# Patient Record
Sex: Female | Born: 1960 | Race: Black or African American | Hispanic: No | Marital: Married | Smoking: Former smoker
Health system: Southern US, Community
[De-identification: ages and names within clinical notes are randomized; demographics above are authoritative.]

## PROBLEM LIST (undated history)

## (undated) DIAGNOSIS — IMO0001 Reserved for inherently not codable concepts without codable children: Secondary | ICD-10-CM

## (undated) DIAGNOSIS — E119 Type 2 diabetes mellitus without complications: Secondary | ICD-10-CM

## (undated) DIAGNOSIS — F410 Panic disorder [episodic paroxysmal anxiety] without agoraphobia: Secondary | ICD-10-CM

## (undated) DIAGNOSIS — E669 Obesity, unspecified: Secondary | ICD-10-CM

## (undated) DIAGNOSIS — J449 Chronic obstructive pulmonary disease, unspecified: Secondary | ICD-10-CM

## (undated) DIAGNOSIS — F419 Anxiety disorder, unspecified: Secondary | ICD-10-CM

## (undated) DIAGNOSIS — M1990 Unspecified osteoarthritis, unspecified site: Secondary | ICD-10-CM

## (undated) DIAGNOSIS — I10 Essential (primary) hypertension: Secondary | ICD-10-CM

## (undated) DIAGNOSIS — Z86718 Personal history of other venous thrombosis and embolism: Secondary | ICD-10-CM

## (undated) DIAGNOSIS — K5790 Diverticulosis of intestine, part unspecified, without perforation or abscess without bleeding: Secondary | ICD-10-CM

## (undated) DIAGNOSIS — E7800 Pure hypercholesterolemia, unspecified: Secondary | ICD-10-CM

## (undated) DIAGNOSIS — G4730 Sleep apnea, unspecified: Secondary | ICD-10-CM

## (undated) DIAGNOSIS — R609 Edema, unspecified: Secondary | ICD-10-CM

## (undated) DIAGNOSIS — K219 Gastro-esophageal reflux disease without esophagitis: Secondary | ICD-10-CM

## (undated) HISTORY — PX: TUBAL LIGATION: SHX77

## (undated) HISTORY — PX: SHOULDER ARTHROSCOPY W/ ROTATOR CUFF REPAIR: SHX2400

## (undated) HISTORY — PX: COLONOSCOPY: SHX174

## (undated) HISTORY — PX: BILATERAL CARPAL TUNNEL RELEASE: SHX6508

## (undated) HISTORY — PX: KNEE ARTHROSCOPY: SHX127

---

## 2003-02-28 ENCOUNTER — Emergency Department (HOSPITAL_COMMUNITY): Admission: AC | Admit: 2003-02-28 | Discharge: 2003-02-28 | Payer: Self-pay

## 2004-01-04 ENCOUNTER — Emergency Department: Payer: Self-pay | Admitting: Emergency Medicine

## 2004-01-05 ENCOUNTER — Emergency Department: Payer: Self-pay | Admitting: Unknown Physician Specialty

## 2004-02-25 ENCOUNTER — Emergency Department: Payer: Self-pay | Admitting: Emergency Medicine

## 2005-07-31 ENCOUNTER — Emergency Department: Payer: Self-pay | Admitting: Emergency Medicine

## 2007-01-02 ENCOUNTER — Emergency Department: Payer: Self-pay | Admitting: Emergency Medicine

## 2008-07-03 ENCOUNTER — Emergency Department: Payer: Self-pay | Admitting: Emergency Medicine

## 2008-07-04 ENCOUNTER — Observation Stay: Payer: Self-pay | Admitting: Internal Medicine

## 2009-08-04 ENCOUNTER — Ambulatory Visit: Payer: Self-pay | Admitting: Internal Medicine

## 2009-08-19 ENCOUNTER — Ambulatory Visit: Payer: Self-pay | Admitting: Rheumatology

## 2009-11-03 ENCOUNTER — Ambulatory Visit: Payer: Self-pay | Admitting: Rheumatology

## 2010-02-21 ENCOUNTER — Inpatient Hospital Stay: Payer: Self-pay | Admitting: Internal Medicine

## 2010-08-17 ENCOUNTER — Ambulatory Visit: Payer: Self-pay | Admitting: Specialist

## 2010-09-06 ENCOUNTER — Ambulatory Visit: Payer: Self-pay | Admitting: Specialist

## 2010-09-20 ENCOUNTER — Ambulatory Visit: Payer: Self-pay | Admitting: Specialist

## 2011-01-16 DIAGNOSIS — I10 Essential (primary) hypertension: Secondary | ICD-10-CM | POA: Diagnosis present

## 2011-05-25 ENCOUNTER — Ambulatory Visit: Payer: Self-pay | Admitting: Internal Medicine

## 2011-08-24 ENCOUNTER — Ambulatory Visit: Payer: Self-pay | Admitting: Cardiovascular Disease

## 2011-11-17 ENCOUNTER — Ambulatory Visit: Payer: Self-pay | Admitting: Specialist

## 2011-12-28 ENCOUNTER — Ambulatory Visit: Payer: Self-pay | Admitting: Specialist

## 2011-12-28 LAB — HEMOGLOBIN: HGB: 14.9 g/dL (ref 12.0–16.0)

## 2011-12-28 LAB — POTASSIUM: Potassium: 4 mmol/L (ref 3.5–5.1)

## 2012-01-04 ENCOUNTER — Ambulatory Visit: Payer: Self-pay | Admitting: Specialist

## 2012-08-26 ENCOUNTER — Ambulatory Visit: Payer: Self-pay | Admitting: Internal Medicine

## 2012-09-03 ENCOUNTER — Emergency Department: Payer: Self-pay | Admitting: Emergency Medicine

## 2012-09-03 LAB — URINALYSIS, COMPLETE
Bilirubin,UR: NEGATIVE
Glucose,UR: NEGATIVE mg/dL (ref 0–75)
Ketone: NEGATIVE
Leukocyte Esterase: NEGATIVE
Nitrite: NEGATIVE
Ph: 5 (ref 4.5–8.0)
Protein: NEGATIVE
RBC,UR: 1 /HPF (ref 0–5)
Specific Gravity: 1.005 (ref 1.003–1.030)
Squamous Epithelial: 7
WBC UR: 1 /HPF (ref 0–5)

## 2012-09-28 ENCOUNTER — Emergency Department: Payer: Self-pay | Admitting: Emergency Medicine

## 2012-10-08 ENCOUNTER — Ambulatory Visit: Payer: Self-pay | Admitting: Internal Medicine

## 2012-10-11 ENCOUNTER — Emergency Department: Payer: Self-pay | Admitting: Internal Medicine

## 2012-11-16 ENCOUNTER — Emergency Department: Payer: Self-pay | Admitting: Internal Medicine

## 2012-11-19 ENCOUNTER — Ambulatory Visit: Payer: Self-pay | Admitting: Pain Medicine

## 2012-12-16 ENCOUNTER — Ambulatory Visit: Payer: Self-pay | Admitting: Pain Medicine

## 2013-01-28 ENCOUNTER — Ambulatory Visit: Payer: Self-pay | Admitting: Pain Medicine

## 2013-02-01 ENCOUNTER — Emergency Department: Payer: Self-pay | Admitting: Emergency Medicine

## 2013-02-20 ENCOUNTER — Ambulatory Visit: Payer: Self-pay | Admitting: Specialist

## 2013-02-25 ENCOUNTER — Ambulatory Visit: Payer: Self-pay | Admitting: Pain Medicine

## 2013-06-24 ENCOUNTER — Emergency Department: Payer: Self-pay | Admitting: Emergency Medicine

## 2013-08-03 ENCOUNTER — Emergency Department: Payer: Self-pay | Admitting: Emergency Medicine

## 2013-08-05 DIAGNOSIS — G4733 Obstructive sleep apnea (adult) (pediatric): Secondary | ICD-10-CM | POA: Diagnosis present

## 2013-09-30 ENCOUNTER — Ambulatory Visit: Payer: Self-pay | Admitting: Pain Medicine

## 2013-10-02 ENCOUNTER — Ambulatory Visit: Payer: Self-pay | Admitting: Gastroenterology

## 2013-10-03 LAB — PATHOLOGY REPORT

## 2013-11-16 ENCOUNTER — Inpatient Hospital Stay: Payer: Self-pay | Admitting: Internal Medicine

## 2013-11-16 LAB — CBC
HCT: 46.5 % (ref 35.0–47.0)
HGB: 15.2 g/dL (ref 12.0–16.0)
MCH: 31 pg (ref 26.0–34.0)
MCHC: 32.6 g/dL (ref 32.0–36.0)
MCV: 95 fL (ref 80–100)
Platelet: 272 10*3/uL (ref 150–440)
RBC: 4.9 10*6/uL (ref 3.80–5.20)
RDW: 14.1 % (ref 11.5–14.5)
WBC: 8.6 10*3/uL (ref 3.6–11.0)

## 2013-11-16 LAB — BASIC METABOLIC PANEL
Anion Gap: 4 — ABNORMAL LOW (ref 7–16)
BUN: 6 mg/dL — ABNORMAL LOW (ref 7–18)
Calcium, Total: 9 mg/dL (ref 8.5–10.1)
Chloride: 99 mmol/L (ref 98–107)
Co2: 36 mmol/L — ABNORMAL HIGH (ref 21–32)
Creatinine: 0.62 mg/dL (ref 0.60–1.30)
EGFR (African American): >60
EGFR (Non-African Amer.): >60
Glucose: 126 mg/dL — ABNORMAL HIGH (ref 65–99)
Osmolality: 277 (ref 275–301)
Potassium: 3.6 mmol/L (ref 3.5–5.1)
Sodium: 139 mmol/L (ref 136–145)

## 2013-11-16 LAB — TROPONIN I: Troponin-I: <0.02 ng/mL

## 2013-11-17 LAB — CBC WITH DIFFERENTIAL/PLATELET
Basophil #: 0 10*3/uL (ref 0.0–0.1)
Basophil %: 0.3 %
Eosinophil #: 0 10*3/uL (ref 0.0–0.7)
Eosinophil %: 0 %
HCT: 43.2 % (ref 35.0–47.0)
HGB: 14.4 g/dL (ref 12.0–16.0)
Lymphocyte #: 0.6 10*3/uL — ABNORMAL LOW (ref 1.0–3.6)
Lymphocyte %: 4.4 %
MCH: 31.4 pg (ref 26.0–34.0)
MCHC: 33.4 g/dL (ref 32.0–36.0)
MCV: 94 fL (ref 80–100)
Monocyte #: 0.4 x10 3/mm (ref 0.2–0.9)
Monocyte %: 3.1 %
Neutrophil #: 12.7 10*3/uL — ABNORMAL HIGH (ref 1.4–6.5)
Neutrophil %: 92.2 %
Platelet: 251 10*3/uL (ref 150–440)
RBC: 4.61 10*6/uL (ref 3.80–5.20)
RDW: 13.9 % (ref 11.5–14.5)
WBC: 13.8 10*3/uL — ABNORMAL HIGH (ref 3.6–11.0)

## 2013-11-17 LAB — BASIC METABOLIC PANEL
Anion Gap: 6 — ABNORMAL LOW (ref 7–16)
BUN: 11 mg/dL (ref 7–18)
Calcium, Total: 9.4 mg/dL (ref 8.5–10.1)
Chloride: 95 mmol/L — ABNORMAL LOW (ref 98–107)
Co2: 36 mmol/L — ABNORMAL HIGH (ref 21–32)
Creatinine: 0.67 mg/dL (ref 0.60–1.30)
EGFR (African American): >60
EGFR (Non-African Amer.): >60
Glucose: 220 mg/dL — ABNORMAL HIGH (ref 65–99)
Osmolality: 280 (ref 275–301)
Potassium: 4 mmol/L (ref 3.5–5.1)
Sodium: 137 mmol/L (ref 136–145)

## 2013-11-29 ENCOUNTER — Emergency Department: Payer: Self-pay | Admitting: Emergency Medicine

## 2013-12-10 ENCOUNTER — Ambulatory Visit: Payer: Self-pay | Admitting: Pain Medicine

## 2013-12-17 ENCOUNTER — Ambulatory Visit: Payer: Self-pay | Admitting: Internal Medicine

## 2014-06-11 ENCOUNTER — Emergency Department
Admit: 2014-06-11 | Disposition: A | Discharge status: Home / Self Care | Payer: Self-pay | Admitting: Emergency Medicine

## 2014-06-11 LAB — BASIC METABOLIC PANEL
Anion Gap: 11 (ref 7–16)
BUN: 7 mg/dL
Calcium, Total: 9.6 mg/dL
Chloride: 91 mmol/L — ABNORMAL LOW
Co2: 37 mmol/L — ABNORMAL HIGH
Creatinine: 0.55 mg/dL
EGFR (African American): >60
EGFR (Non-African Amer.): >60
Glucose: 132 mg/dL — ABNORMAL HIGH
Potassium: 2.9 mmol/L — ABNORMAL LOW
Sodium: 139 mmol/L

## 2014-06-11 LAB — TROPONIN I: Troponin-I: <0.03 ng/mL

## 2014-06-11 LAB — CBC
HCT: 43.5 % (ref 35.0–47.0)
HGB: 14.3 g/dL (ref 12.0–16.0)
MCH: 29.9 pg (ref 26.0–34.0)
MCHC: 32.9 g/dL (ref 32.0–36.0)
MCV: 91 fL (ref 80–100)
Platelet: 258 10*3/uL (ref 150–440)
RBC: 4.78 10*6/uL (ref 3.80–5.20)
RDW: 14 % (ref 11.5–14.5)
WBC: 11.3 10*3/uL — ABNORMAL HIGH (ref 3.6–11.0)

## 2014-06-30 NOTE — Op Note (Signed)
PATIENT NAME:  Patty Leon, Patty Leon MR#:  [id number] DATE OF BIRTH:  10-27-1960  DATE OF PROCEDURE:  01/04/2012  PREOPERATIVE DIAGNOSES:  1. Tear of medial meniscus, left knee.  2. Degenerative arthritis medial compartment, left knee.   POSTOPERATIVE DIAGNOSES: 1. Tear of medial meniscus, left knee.  2. Degenerative arthritis medial compartment, left knee.   PROCEDURE: Arthroscopic partial medial meniscectomy, left knee.   SURGEON: Lucas Mallow, MD   ANESTHESIA: General.   COMPLICATIONS: None.   PROCEDURE: After adequate induction of general anesthesia, the left lower extremity is secured in the legholder for arthroscopy in the usual manner. The left leg is thoroughly prepped with alcohol and ChloraPrep and draped in standard sterile fashion. The joint is infiltrated with 10 mL of Marcaine with epinephrine. Diagnostic arthroscopy is then performed through a lateral portal. The patellofemoral joint is within normal limits. The lateral compartment is normal. There is some increased synovium in the intercondylar notch but, otherwise, the anterior cruciate ligament is normal. The pathology is confined largely to the medial compartment. There is seen to be moderate chondromalacia of the weightbearing surface of the medial femoral condyle. There is an associated complex tear of the posterior horn of the medial meniscus. Using a combination of the duckbill forceps, the full radial resector, and the 50 degree ArthroWand, the torn portion of the meniscus is fully resected back to a stable rim. Careful search for any other pathology is made and none is seen. The joint is thoroughly irrigated multiple times. Skin edges are closed with 5-0 nylon. The joint is infiltrated with 1 mL of Depo-Medrol, 4 mg of morphine, and 10 mL of Marcaine with epinephrine. Soft bulky dressing is applied.     The patient is returned to the recovery room in satisfactory condition having tolerated the procedure quite well.    ____________________________ Lucas Mallow, MD ces:drc D: 01/04/2012 12:59:33 ET T: 01/04/2012 13:40:04 ET JOB#: [id number]  cc: Lucas Mallow, MD, <Dictator> Lucas Mallow MD ELECTRONICALLY SIGNED 01/06/2012 8:57

## 2014-07-04 NOTE — H&P (Signed)
PATIENT NAME:  Patty Leon, Patty Leon MR#:  [id number] DATE OF BIRTH:  [birth year]  DATE OF ADMISSION:  11/16/2013  PRIMARY CARE PHYSICIAN:  Perrin Maltese, MD.   REFERRING EMERGENCY ROOM PHYSICIAN:  Dr. Wynetta Emery.   CHIEF COMPLAINT: Shortness of breath.   HISTORY OF PRESENT ILLNESS: This is a very pleasant 54 year old female with history of COPD, obstructive sleep apnea on CPAP, hypertension, hyperlipidemia, who presents today with 2-3 days of progressive shortness of breath. She reports that about 5 days ago she developed sinus symptoms with runny nose and congestion. Two days ago she started to develop a cough productive of clear sputum which has now progressed to being productive of beige sputum. No hemoptysis. On the evening of admission she reports being very short of breath at rest and with conversation. No fevers, no chills, no nausea, vomiting, diarrhea. No chest pain. She has been taking Mucinex, over-the-counter cold medication. She has also been taking hydrocodone for pain in her knee. She also reports that she has been unable to use her CPAP machine due to congestion. On presentation to the Emergency Room, her oxygen saturation was 78% on room air. In conversation on 2 liters of nasal cannula she desaturated into the mid 80s. She is admitted for treatment of COPD exacerbation.   PAST MEDICAL HISTORY:  1.  COPD. 2.  Obstructive sleep apnea on CPAP. She reports that she has oxygen through her CPAP.  3.  Obesity.  4.  Hypertension.  5.  Hyperlipidemia.  6.  Colon polyps.  7.  Ongoing tobacco abuse.  PAST SURGICAL HISTORY:  1.  Bilateral carpal tunnel release.  2.  Tubal ligation.  3.  Right shoulder surgery.  4.  Left knee surgery.   HOME MEDICATIONS:  1.  Vitamin D 2000 units once a day.  2.  Spiriva 18 mcg inhalation 1 capsule inhaled once a day.  3.  Pro air HFA inhaler 1 puff every 6 hours as needed.  4.  Metoprolol tartrate 50 mg once a day.  5.  Hydrochlorothiazide 25 mg 1 tablet  once a day.  6.  Daliresp 500 mcg 1 tablet once a day.  7.  Crestor 10 mg 1 tablet once a day.  8.  Amlodipine 5 mg 1 tablet once a day.  9.  Albuterol ipratropium 2.5 mg/0.5 mg/3 mL inhalation solution to be used with the nebulizer as needed for shortness of breath.  10. Advair Diskus 250 mcg/50 mcg inhalation powder, 1 puff inhaled 2 times a day.   ALLERGIES:  CELEBREX CAUSES SWELLING.   SOCIAL HISTORY: The patient lives with her husband. She reports current smoking of 1 pack per day. She reports that she is ready to quit smoking and actually has Nicoderm patches at home. Denies alcohol or illicit substance abuse. She reports that she is not working due to disability.   FAMILY MEDICAL HISTORY: Negative for coronary artery disease or stroke. She reports that her mother had COPD.  FAMILY MEDICAL HISTORY: Negative for colon or breast cancer.   REVIEW OF SYSTEMS: CONSTITUTIONAL: Positive for shortness of breath and fatigue. Negative for fevers, chills, or weight change.  HEENT: No change in vision, change in hearing, pain in the eyes or ears, difficulty swallowing, oral lesion, throat pain.  RESPIRATORY: Positive for shortness of breath, wheezing, cough, sputum production. Negative for hemoptysis.  CARDIOVASCULAR: Negative for chest pain, palpitations, syncope, edema, orthopnea.  GASTROINTESTINAL: Negative for abdominal pain, diarrhea, nausea, vomiting.  EXTREMITIES: Negative for tender swollen joints. Decreased  range of motion, edema, trauma.  NEUROLOGIC: Negative for focal numbness or weakness, headache, seizure.  PSYCHIATRIC: Negative for uncontrolled depression or anxiety.   PHYSICAL EXAMINATION:  VITAL SIGNS: Temperature 97.8, pulse 104, respirations 24, blood pressure 127/77, pulse oximetry 98% on room air at rest, desaturating into the high 80s with conversation.  GENERAL: No acute distress, resting comfortably in the exam bed.  HEENT: Pupils are equal, round, and reactive,  conjunctivae are clear without injection or icterus. Oral mucous membranes are pink and moist, no oral ulcer. Posterior oropharynx is clear with no exudate or tonsillar enlargement, fair dentition, trachea is midline. No cervical lymphadenopathy, no tender thyroid or thyroid nodule is noted.  RESPIRATORY: The patient is taking short shallow respirations, there are diffuse wheezes, lung sounds are distant, fair air movement.  CARDIOVASCULAR: Tachycardic, regular. No murmurs, rubs, or gallops. Distant heart sounds, no peripheral edema. Peripheral pulses are 2+.  ABDOMEN: Obese. Bowel sounds are positive. No tenderness, no guarding, no mass, no hepatosplenomegaly noted.  MUSCULOSKELETAL: Range of motion is normal in all joints. No tender or swollen joints.  NEUROLOGIC: Cranial nerves II-XII are grossly intact. Strength and tone and sensation are normal and appropriate, nonfocal neurologic examination.  PSYCHIATRIC: The patient is alert and oriented x4 with good insight. No signs of uncontrolled depression or anxiety.   LABORATORY DATA: Sodium 139, potassium 3.6, chloride 99, bicarbonate 36, BUN 6, creatinine 0.62, serum glucose 126, troponin less than 0.02, white blood cells 8.6, hemoglobin 15.2, platelets 272,000, MCV is 95.   IMAGING: Chest x-ray shows no acute cardiopulmonary process.   ASSESSMENT AND PLAN:  1.  Acute respiratory failure with hypoxemia due to chronic obstructive pulmonary disease exacerbation: The patient is currently on 2 liters nasal cannula and saturating well at rest. She desaturates quickly.  2.  Chronic obstructive pulmonary disease with exacerbation: Start IV Solu-Medrol, albuterol ipratropium nebulizers, respiratory therapy, azithromycin. She reports that she has a sleep apnea machine at home that delivers oxygen. It is unclear to me whether this patient should be on oxygen at home at baseline.  3.  Ongoing tobacco abuse: Tobacco abuse cessation counseling provided today by  me. She does have nicotine patches at home. She was encouraged to stop smoking.  4.  Obstructive sleep apnea on CPAP. CPAP machine ordered for this evening.  5.  Hypertension: Controlled. Continue oral antihypertensives.  6.  Hyperlipidemia: Continue statin.  7.  Obesity: This was discussed with the patient has a contributing factor to her obstructive sleep apnea and chronic obstructive pulmonary disease exacerbation. She will have a heart healthy diet. Physical activity encouraged.  8.  Colon polyps: The patient is aware of colon polyps and is planning to follow up with her gastroenterologist in a few months for repeat colonoscopy.  9.  Prophylaxis: Heparin for deep vein thrombosis prophylaxis.   Time spent on admission: 40 minutes.     ____________________________ Earleen Newport. Volanda Napoleon, MD cpw:lt D: 11/16/2013 10:01:03 ET T: 11/16/2013 10:48:54 ET JOB#: [id number]  cc: Barnetta Chapel P. Volanda Napoleon, MD, <Dictator> Aldean Jewett MD ELECTRONICALLY SIGNED 11/19/2013 12:44

## 2014-07-04 NOTE — Discharge Summary (Signed)
PATIENT NAME:  Patty Leon, Patty Leon MR#:  [id number] DATE OF BIRTH:  May 10, 1960  DATE OF ADMISSION:  11/16/2013 DATE OF DISCHARGE:  11/19/2013  PRESENTING COMPLAINT: Shortness of breath.   DISCHARGE DIAGNOSES: 1.  Acute on chronic respiratory failure secondary to chronic obstructive pulmonary disease exacerbation.  2.  Ongoing tobacco abuse.   3.  Obesity.  4.  Hypertension.  5.  Sleep apnea on CPAP.  6.  Sinus congestion.   CODE STATUS: Full code.   MEDICATIONS: 1.  Advair 250/50 one puff b.i.d.  2.  Albuterol ipratropium nebulizers p.r.n.  3.  ProAir HFA 1 puff every 6 hours as needed.  4.  Spiriva 18 mcg inhalation daily.  5.  Amlodipine 5 mg daily.  6.  Daliresp 500 mcg p.o. daily.  7.  Metoprolol 50 mg daily.  8.  Vitamin D 2000 units daily.  9.  Hydrochlorothiazide 25 mg daily.  10.  Crestor 10 mg at bedtime.  11.  Percocet 10/325 one tablet every 6 hours as needed.  12.  Ambien 10 mg daily as needed.  13.  Prednisone taper.  14.  Zithromax 250 p.o. daily.  15.  Two-liter nasal cannula.   FOLLOWUP: With Dr. Lamonte Sakai in 1 to 2 weeks.   LABORATORY DATA: White count at discharge is 13.8.   IMAGING:  Chest x-ray: No acute cardiopulmonary process.   BRIEF SUMMARY OF HOSPITAL COURSE: Patty Leon is a 54 year old, obese, African-American female with a past medical history of COPD with ongoing tobacco abuse, comes in with increasing shortness of breath and sinus congestion. She is admitted with:  1.  Acute on chronic respiratory failure due to COPD exacerbation. The patient was weaned down with her oxygen and she is requiring 2 liters to keep her saturations above 92%. She is prescribed oxygen during daytime as well.   2.  COPD exacerbation. She will finish up the steroid taper, p.o. Zithromax inhalers and nebulizers.  3.  Ongoing tobacco abuse.  Tobacco cessation counseling provided.  4.  Obstructive sleep apnea on CPAP.  5.  Hyperlipidemia. Continue statins.  6.   Hypertension. Continue amlodipine, hydrochlorothiazide, and metoprolol.  7.  Overall, hospital stay otherwise remained stable. The patient remained a full code.   TIME SPENT: Forty minutes.    ____________________________ Hart Rochester Posey Pronto, MD sap:lr D: 11/19/2013 13:33:09 ET T: 11/19/2013 15:19:54 ET JOB#: [id number]  cc: Lujain Kraszewski A. Posey Pronto, MD, <Dictator> Perrin Maltese, MD Ilda Basset MD ELECTRONICALLY SIGNED 11/20/2013 14:04

## 2014-10-23 ENCOUNTER — Encounter: Payer: Self-pay | Admitting: Emergency Medicine

## 2014-10-23 DIAGNOSIS — M5441 Lumbago with sciatica, right side: Secondary | ICD-10-CM | POA: Insufficient documentation

## 2014-10-23 DIAGNOSIS — Z7951 Long term (current) use of inhaled steroids: Secondary | ICD-10-CM | POA: Insufficient documentation

## 2014-10-23 DIAGNOSIS — M79604 Pain in right leg: Secondary | ICD-10-CM | POA: Diagnosis present

## 2014-10-23 DIAGNOSIS — Z791 Long term (current) use of non-steroidal anti-inflammatories (NSAID): Secondary | ICD-10-CM | POA: Insufficient documentation

## 2014-10-23 DIAGNOSIS — Z79899 Other long term (current) drug therapy: Secondary | ICD-10-CM | POA: Diagnosis not present

## 2014-10-23 DIAGNOSIS — M79671 Pain in right foot: Secondary | ICD-10-CM | POA: Insufficient documentation

## 2014-10-23 DIAGNOSIS — Z9981 Dependence on supplemental oxygen: Secondary | ICD-10-CM | POA: Diagnosis not present

## 2014-10-23 DIAGNOSIS — I10 Essential (primary) hypertension: Secondary | ICD-10-CM | POA: Diagnosis not present

## 2014-10-23 DIAGNOSIS — Z79811 Long term (current) use of aromatase inhibitors: Secondary | ICD-10-CM | POA: Insufficient documentation

## 2014-10-23 DIAGNOSIS — G8929 Other chronic pain: Secondary | ICD-10-CM | POA: Diagnosis not present

## 2014-10-23 DIAGNOSIS — Z720 Tobacco use: Secondary | ICD-10-CM | POA: Insufficient documentation

## 2014-10-23 MED ORDER — OXYCODONE-ACETAMINOPHEN 5-325 MG PO TABS
1.0000 | ORAL_TABLET | Freq: Once | ORAL | Status: AC
  Administered 2014-10-23: 1 via ORAL

## 2014-10-23 MED ORDER — OXYCODONE-ACETAMINOPHEN 5-325 MG PO TABS
ORAL_TABLET | ORAL | Status: AC
  Administered 2014-10-23: 1 via ORAL
  Filled 2014-10-23: qty 1

## 2014-10-23 NOTE — ED Notes (Signed)
Patient with complaint of right leg pain and swelling that started yesterday. Denies injury. Patient states that today her left leg started having pain and swelling.

## 2014-10-24 ENCOUNTER — Encounter: Payer: Self-pay | Admitting: Emergency Medicine

## 2014-10-24 ENCOUNTER — Emergency Department
Admission: EM | Admit: 2014-10-24 | Discharge: 2014-10-24 | Disposition: A | Discharge status: Home / Self Care | Payer: Medicaid Other | Attending: Emergency Medicine | Admitting: Emergency Medicine

## 2014-10-24 ENCOUNTER — Emergency Department: Payer: Medicaid Other

## 2014-10-24 DIAGNOSIS — M79671 Pain in right foot: Secondary | ICD-10-CM

## 2014-10-24 DIAGNOSIS — M5441 Lumbago with sciatica, right side: Secondary | ICD-10-CM

## 2014-10-24 HISTORY — DX: Edema, unspecified: R60.9

## 2014-10-24 HISTORY — DX: Essential (primary) hypertension: I10

## 2014-10-24 HISTORY — DX: Chronic obstructive pulmonary disease, unspecified: J44.9

## 2014-10-24 HISTORY — DX: Sleep apnea, unspecified: G47.30

## 2014-10-24 MED ORDER — OXYCODONE HCL 5 MG PO TABS
5.0000 mg | ORAL_TABLET | Freq: Once | ORAL | Status: AC
  Administered 2014-10-24: 5 mg via ORAL
  Filled 2014-10-24: qty 1

## 2014-10-24 MED ORDER — PREDNISONE 20 MG PO TABS
60.0000 mg | ORAL_TABLET | Freq: Once | ORAL | Status: AC
  Administered 2014-10-24: 60 mg via ORAL
  Filled 2014-10-24: qty 3

## 2014-10-24 MED ORDER — OXYCODONE HCL 5 MG PO TABS: 5.0000 mg | ORAL_TABLET | Freq: Once | ORAL | Status: DC

## 2014-10-24 MED ORDER — PREDNISONE 10 MG PO TABS: 30.0000 mg | ORAL_TABLET | Freq: Once | ORAL | Status: DC

## 2014-10-24 NOTE — ED Provider Notes (Signed)
[facility] Emergency Department Provider Note  ____________________________________________  Time seen: 3:20 AM   I have reviewed the triage vital signs and the nursing notes.   HISTORY  Chief Complaint Leg Pain  right foot pain    HPI Patty Leon is a 54 y.o. female with a history of "sciatica" and back pain. She reports she began to have pain in her right foot on Thursday morning. This was on the top of her foot, though she says the discomfort was somewhat vague and moved up her leg and it is similar to prior sciatic pain that she has had. She reports the pain also seemed to spread to the left leg and she had some swelling in her legs. She has more pain when she is up and ambulating. She denies any injury or impact to the foot. She denies any numbness or paresthesia.    Past Medical History  Diagnosis Date  . Sleep apnea   . COPD (chronic obstructive pulmonary disease)   . Fluid retention   . Hypertension     There are no active problems to display for this patient.   History reviewed. No pertinent past surgical history.  Current Outpatient Rx  Name  Route  Sig  Dispense  Refill  . amLODipine (NORVASC) 5 MG tablet   Oral   Take 5 mg by mouth daily.         . cholecalciferol (VITAMIN D) 1000 UNITS tablet   Oral   Take 2,000 Units by mouth daily.         . Fluticasone-Salmeterol (ADVAIR) 250-50 MCG/DOSE AEPB   Inhalation   Inhale 1 puff into the lungs 2 (two) times daily.         . hydrochlorothiazide (MICROZIDE) 12.5 MG capsule   Oral   Take 12.5 mg by mouth daily.         . metoprolol succinate (TOPROL-XL) 50 MG 24 hr tablet   Oral   Take 50 mg by mouth daily. Take with or immediately following a meal.         . roflumilast (DALIRESP) 500 MCG TABS tablet   Oral   Take 500 mcg by mouth daily.         . rosuvastatin (CRESTOR) 20 MG tablet   Oral   Take 20 mg by mouth daily.         Marland Kitchen tiotropium (SPIRIVA) 18  MCG inhalation capsule   Inhalation   Place 18 mcg into inhaler and inhale daily.         Marland Kitchen oxyCODONE (OXY IR/ROXICODONE) 5 MG immediate release tablet   Oral   Take 1 tablet (5 mg total) by mouth once.   15 tablet   0   . predniSONE (DELTASONE) 10 MG tablet   Oral   Take 3 tablets (30 mg total) by mouth once.   15 tablet   0     Allergies Celebrex  History reviewed. No pertinent family history.  Social History Social History  Substance Use Topics  . Smoking status: Current Every Day Smoker -- 0.50 packs/day for 30 years    Types: Cigarettes  . Smokeless tobacco: None  . Alcohol Use: No    Review of Systems  Constitutional: Negative for fever. ENT: Negative for sore throat. Cardiovascular: Negative for chest pain. Respiratory: Negative for shortness of breath. Gastrointestinal: Negative for abdominal pain, vomiting and diarrhea. Genitourinary: Negative for dysuria. Musculoskeletal: Pain in the right foot as well as some acute  on chronic back pain. See history of present illness Skin: Negative for rash. Neurological: Negative for headaches   10-point ROS otherwise negative.  ____________________________________________   PHYSICAL EXAM:  VITAL SIGNS: ED Triage Vitals  Enc Vitals Group     BP 10/23/14 2309 98/81 mmHg     Pulse Rate 10/23/14 2309 108     Resp 10/23/14 2309 18     Temp 10/23/14 2309 98.5 F (36.9 C)     Temp Source 10/23/14 2309 Oral     SpO2 10/23/14 2309 92 %     Weight 10/23/14 2309 275 lb (124.739 kg)     Height 10/23/14 2309 5\' 2"  (1.575 m)     Head Cir --      Peak Flow --      Pain Score 10/23/14 2310 10     Pain Loc --      Pain Edu? --      Excl. in [place]? --     Constitutional:  Alert and oriented. Well appearing and in no distress. Patient does have a large body habitus. She has a nasal cannula on. She does use oxygen at home due to COPD. ENT   Head: Normocephalic and atraumatic.   Nose: No  congestion/rhinnorhea.   Mouth/Throat: Mucous membranes are moist. Cardiovascular: Normal rate, regular rhythm, no murmur noted Respiratory:  Normal respiratory effort, no tachypnea.    Breath sounds are clear and equal bilaterally.  Gastrointestinal: Soft and nontender. No distention.  Back: No muscle spasm, no tenderness, no CVA tenderness. Musculoskeletal: No deformity noted. There is minimal but focal tenderness on the right foot in the midfoot on the lateral side. There is no erythema or noted swelling or deformity. She does have normal range of motion in all extremities. This includes normal straight leg raises.  No noted edema noted with equal circumference in both calves. Back: Patient does have some tenderness on palpation of her right lower back. She reports this is chronic. Neurologic:  Normal speech and language. No gross focal neurologic deficits are appreciated.  Skin:  Skin is warm, dry. No rash noted. Psychiatric: Mood and affect are normal. Speech and behavior are normal.  ____________________________________________     RADIOLOGY  Doppler ultrasound of the lower extremities, bilateral No evidence of DVT.  ____________________________________________  ____________________________________________   INITIAL IMPRESSION / ASSESSMENT AND PLAN / ED COURSE  Pertinent labs & imaging results that were available during my care of the patient were reviewed by me and considered in my medical decision making (see chart for details).   The patient complains of pain in her right foot but also pain in the right leg consistent with her prior sciatica. The foot overall appears well. There is no deformity or crepitus. There is no soft tissue injury or erythema. There is no indication for x-ray of the right foot. I think the patient is most likely right, that this discomfort extends from her troponins with her sciatic nerve. Her straight leg raises are normal. Her sensation is intact.  She takes Percocet 7.5 mg tablets 4 times a day. We will increase this temporarily to be used as needed. In addition, the patient I spoke about the use of prednisone and acute back pain situations. I explained to her that this is not always helpful, however she says that when she has had in the past it has been beneficial and would like to take a dose of steroids. We will treat her with prednisone, 60 mg now, and 30  mg a day for the next 4 days.  The patient has an appointment on Tuesday with her pain management doctor.  ____________________________________________   FINAL CLINICAL IMPRESSION(S) / ED DIAGNOSES  Final diagnoses:  Foot pain, right  Right-sided low back pain with right-sided sciatica      Ahmed Prima, MD 10/24/14 [phone]

## 2014-10-24 NOTE — ED Notes (Signed)
Pt taken to US

## 2014-10-24 NOTE — Discharge Instructions (Signed)
The pain in her right foot may be due to sciatica. We discussed that in the emergency department. We've spoken about use of prednisone and how it may or may not offer benefit, but it seems to have helped in the past. Take prednisone as prescribed. You currently take oxycodone 7.5 mg. Since this does not seem to be adequate, you may take a 5 mg tablet as well only as needed. Follow-up with her pain management doctor on Tuesday as planned. Inform them that we wrote her prescription for additional narcotics. Follow-up with her regular doctor is coming week as well. Return to the emergency department if your symptoms worsen or you have other urgent concerns.  Sciatica Sciatica is pain, weakness, numbness, or tingling along the path of the sciatic nerve. The nerve starts in the lower back and runs down the back of each leg. The nerve controls the muscles in the lower leg and in the back of the knee, while also providing sensation to the back of the thigh, lower leg, and the sole of your foot. Sciatica is a symptom of another medical condition. For instance, nerve damage or certain conditions, such as a herniated disk or bone spur on the spine, pinch or put pressure on the sciatic nerve. This causes the pain, weakness, or other sensations normally associated with sciatica. Generally, sciatica only affects one side of the body. CAUSES   Herniated or slipped disc.  Degenerative disk disease.  A pain disorder involving the narrow muscle in the buttocks (piriformis syndrome).  Pelvic injury or fracture.  Pregnancy.  Tumor (rare). SYMPTOMS  Symptoms can vary from mild to very severe. The symptoms usually travel from the low back to the buttocks and down the back of the leg. Symptoms can include:  Mild tingling or dull aches in the lower back, leg, or hip.  Numbness in the back of the calf or sole of the foot.  Burning sensations in the lower back, leg, or hip.  Sharp pains in the lower back, leg, or  hip.  Leg weakness.  Severe back pain inhibiting movement. These symptoms may get worse with coughing, sneezing, laughing, or prolonged sitting or standing. Also, being overweight may worsen symptoms. DIAGNOSIS  Your caregiver will perform a physical exam to look for common symptoms of sciatica. He or she may ask you to do certain movements or activities that would trigger sciatic nerve pain. Other tests may be performed to find the cause of the sciatica. These may include:  Blood tests.  X-rays.  Imaging tests, such as an MRI or CT scan. TREATMENT  Treatment is directed at the cause of the sciatic pain. Sometimes, treatment is not necessary and the pain and discomfort goes away on its own. If treatment is needed, your caregiver may suggest:  Over-the-counter medicines to relieve pain.  Prescription medicines, such as anti-inflammatory medicine, muscle relaxants, or narcotics.  Applying heat or ice to the painful area.  Steroid injections to lessen pain, irritation, and inflammation around the nerve.  Reducing activity during periods of pain.  Exercising and stretching to strengthen your abdomen and improve flexibility of your spine. Your caregiver may suggest losing weight if the extra weight makes the back pain worse.  Physical therapy.  Surgery to eliminate what is pressing or pinching the nerve, such as a bone spur or part of a herniated disk. HOME CARE INSTRUCTIONS   Only take over-the-counter or prescription medicines for pain or discomfort as directed by your caregiver.  Apply ice to  the affected area for 20 minutes, 3-4 times a day for the first 48-72 hours. Then try heat in the same way.  Exercise, stretch, or perform your usual activities if these do not aggravate your pain.  Attend physical therapy sessions as directed by your caregiver.  Keep all follow-up appointments as directed by your caregiver.  Do not wear high heels or shoes that do not provide proper  support.  Check your mattress to see if it is too soft. A firm mattress may lessen your pain and discomfort. SEEK IMMEDIATE MEDICAL CARE IF:   You lose control of your bowel or bladder (incontinence).  You have increasing weakness in the lower back, pelvis, buttocks, or legs.  You have redness or swelling of your back.  You have a burning sensation when you urinate.  You have pain that gets worse when you lie down or awakens you at night.  Your pain is worse than you have experienced in the past.  Your pain is lasting longer than 4 weeks.  You are suddenly losing weight without reason. MAKE SURE YOU:  Understand these instructions.  Will watch your condition.  Will get help right away if you are not doing well or get worse. Document Released: 02/21/2001 Document Revised: 08/29/2011 Document Reviewed: 07/09/2011 [facility] Patient Information 2015 [place], [place]. This information is not intended to replace advice given to you by your health care provider. Make sure you discuss any questions you have with your health care provider.

## 2014-10-24 NOTE — ED Notes (Signed)
Pt reports pain/swelling to lower legs starting last Thursday. Pt reports pain started in right leg then as she favored it, left started hurting.  Pt reports pain radiates to upper legs as well. Swelling noted to lower legs and feet bilat, pt reports being on diuretic.  Pt NAD at this time.

## 2014-11-12 ENCOUNTER — Other Ambulatory Visit: Payer: Self-pay | Admitting: Nurse Practitioner

## 2014-11-12 DIAGNOSIS — Z1231 Encounter for screening mammogram for malignant neoplasm of breast: Secondary | ICD-10-CM

## 2014-12-21 ENCOUNTER — Ambulatory Visit: Payer: Medicaid Other | Attending: Nurse Practitioner

## 2015-01-21 ENCOUNTER — Encounter: Payer: Self-pay | Admitting: *Deleted

## 2015-01-22 ENCOUNTER — Ambulatory Visit: Payer: Medicaid Other | Admitting: Certified Registered Nurse Anesthetist

## 2015-01-22 ENCOUNTER — Encounter: Payer: Self-pay | Admitting: *Deleted

## 2015-01-22 ENCOUNTER — Ambulatory Visit
Admission: RE | Admit: 2015-01-22 | Discharge: 2015-01-22 | Disposition: A | Discharge status: Home / Self Care | Payer: Medicaid Other | Source: Ambulatory Visit | Attending: Gastroenterology | Admitting: Gastroenterology

## 2015-01-22 ENCOUNTER — Encounter
Admission: RE | Disposition: A | Discharge status: Home / Self Care | Payer: Self-pay | Source: Ambulatory Visit | Attending: Gastroenterology

## 2015-01-22 DIAGNOSIS — D122 Benign neoplasm of ascending colon: Secondary | ICD-10-CM | POA: Diagnosis not present

## 2015-01-22 DIAGNOSIS — G4730 Sleep apnea, unspecified: Secondary | ICD-10-CM | POA: Diagnosis not present

## 2015-01-22 DIAGNOSIS — Z86010 Personal history of colonic polyps: Secondary | ICD-10-CM | POA: Diagnosis present

## 2015-01-22 DIAGNOSIS — D125 Benign neoplasm of sigmoid colon: Secondary | ICD-10-CM | POA: Diagnosis not present

## 2015-01-22 DIAGNOSIS — Z7951 Long term (current) use of inhaled steroids: Secondary | ICD-10-CM | POA: Insufficient documentation

## 2015-01-22 DIAGNOSIS — J449 Chronic obstructive pulmonary disease, unspecified: Secondary | ICD-10-CM | POA: Insufficient documentation

## 2015-01-22 DIAGNOSIS — Z7952 Long term (current) use of systemic steroids: Secondary | ICD-10-CM | POA: Insufficient documentation

## 2015-01-22 DIAGNOSIS — J45909 Unspecified asthma, uncomplicated: Secondary | ICD-10-CM | POA: Insufficient documentation

## 2015-01-22 DIAGNOSIS — D124 Benign neoplasm of descending colon: Secondary | ICD-10-CM | POA: Diagnosis not present

## 2015-01-22 DIAGNOSIS — I10 Essential (primary) hypertension: Secondary | ICD-10-CM | POA: Insufficient documentation

## 2015-01-22 DIAGNOSIS — K5730 Diverticulosis of large intestine without perforation or abscess without bleeding: Secondary | ICD-10-CM | POA: Insufficient documentation

## 2015-01-22 DIAGNOSIS — Z79899 Other long term (current) drug therapy: Secondary | ICD-10-CM | POA: Diagnosis not present

## 2015-01-22 DIAGNOSIS — D123 Benign neoplasm of transverse colon: Secondary | ICD-10-CM | POA: Diagnosis present

## 2015-01-22 DIAGNOSIS — F17210 Nicotine dependence, cigarettes, uncomplicated: Secondary | ICD-10-CM | POA: Diagnosis not present

## 2015-01-22 DIAGNOSIS — Z6841 Body Mass Index (BMI) 40.0 and over, adult: Secondary | ICD-10-CM | POA: Insufficient documentation

## 2015-01-22 HISTORY — PX: COLONOSCOPY: SHX5424

## 2015-01-22 SURGERY — COLONOSCOPY
Anesthesia: General

## 2015-01-22 MED ORDER — GLYCOPYRROLATE 0.2 MG/ML IJ SOLN
INTRAMUSCULAR | Status: DC | PRN
  Administered 2015-01-22: 0.2 mg via INTRAVENOUS

## 2015-01-22 MED ORDER — FENTANYL CITRATE (PF) 100 MCG/2ML IJ SOLN
INTRAMUSCULAR | Status: DC | PRN
  Administered 2015-01-22 (×2): 50 ug via INTRAVENOUS

## 2015-01-22 MED ORDER — SODIUM CHLORIDE 0.9 % IV SOLN
INTRAVENOUS | Status: DC
  Administered 2015-01-22: 08:00:00 via INTRAVENOUS

## 2015-01-22 MED ORDER — PROPOFOL 10 MG/ML IV BOLUS
INTRAVENOUS | Status: DC | PRN
  Administered 2015-01-22: 20 mg via INTRAVENOUS
  Administered 2015-01-22: 30 mg via INTRAVENOUS
  Administered 2015-01-22: 20 mg via INTRAVENOUS

## 2015-01-22 MED ORDER — PROPOFOL 500 MG/50ML IV EMUL
INTRAVENOUS | Status: DC | PRN
  Administered 2015-01-22: 130 ug/kg/min via INTRAVENOUS

## 2015-01-22 MED ORDER — MIDAZOLAM HCL 2 MG/2ML IJ SOLN
INTRAMUSCULAR | Status: DC | PRN
  Administered 2015-01-22 (×2): 1 mg via INTRAVENOUS

## 2015-01-22 NOTE — Anesthesia Procedure Notes (Signed)
Date/Time: 01/22/2015 8:08 AM Performed by: Johnna Acosta Pre-anesthesia Checklist: Patient identified, Emergency Drugs available, Suction available, Patient being monitored and Timeout performed Patient Re-evaluated:Patient Re-evaluated prior to inductionOxygen Delivery Method: Nasal cannula

## 2015-01-22 NOTE — Discharge Instructions (Signed)

## 2015-01-22 NOTE — H&P (Signed)
  Primary Care Physician:  Kasandra Knudsen, NP  Pre-Procedure History & Physical: HPI:  Patty Leon is a 54 y.o. female is here for an colonoscopy.   Past Medical History  Diagnosis Date  . Sleep apnea   . COPD (chronic obstructive pulmonary disease) ([place])   . Fluid retention   . Hypertension   . Asthma     Past Surgical History  Procedure Laterality Date  . Colonoscopy      Prior to Admission medications   Medication Sig Start Date End Date Taking? Authorizing Provider  amLODipine (NORVASC) 5 MG tablet Take 5 mg by mouth daily.   Yes Historical Provider, MD  cholecalciferol (VITAMIN D) 1000 UNITS tablet Take 2,000 Units by mouth daily.   Yes Historical Provider, MD  Fluticasone-Salmeterol (ADVAIR) 250-50 MCG/DOSE AEPB Inhale 1 puff into the lungs 2 (two) times daily.   Yes Historical Provider, MD  hydrochlorothiazide (MICROZIDE) 12.5 MG capsule Take 12.5 mg by mouth daily.   Yes Historical Provider, MD  metoprolol succinate (TOPROL-XL) 50 MG 24 hr tablet Take 50 mg by mouth daily. Take with or immediately following a meal.   Yes Historical Provider, MD  oxyCODONE (OXY IR/ROXICODONE) 5 MG immediate release tablet Take 1 tablet (5 mg total) by mouth once. 10/24/14  Yes Ahmed Prima, MD  predniSONE (DELTASONE) 10 MG tablet Take 3 tablets (30 mg total) by mouth once. 10/24/14  Yes Ahmed Prima, MD  roflumilast (DALIRESP) 500 MCG TABS tablet Take 500 mcg by mouth daily.   Yes Historical Provider, MD  rosuvastatin (CRESTOR) 20 MG tablet Take 20 mg by mouth daily.   Yes Historical Provider, MD  tiotropium (SPIRIVA) 18 MCG inhalation capsule Place 18 mcg into inhaler and inhale daily.   Yes Historical Provider, MD    Allergies as of 12/17/2014 - Review Complete 10/24/2014  Allergen Reaction Noted  . Celebrex [celecoxib] Swelling 10/23/2014    History reviewed. No pertinent family history.  Social History   Social History  . Marital Status: Married    Spouse Name: N/A  .  Number of Children: N/A  . Years of Education: N/A   Occupational History  . Not on file.   Social History Main Topics  . Smoking status: Current Every Day Smoker -- 0.50 packs/day for 30 years    Types: Cigarettes  . Smokeless tobacco: Never Used  . Alcohol Use: No  . Drug Use: No  . Sexual Activity: Not on file   Other Topics Concern  . Not on file   Social History Narrative     Physical Exam: BP 145/80 mmHg  Pulse 93  Temp(Src) 98.5 F (36.9 C) (Tympanic)  Resp 20  Ht 5\' 2"  (1.575 m)  Wt 127.007 kg (280 lb)  BMI 51.20 kg/m2  SpO2 95%  LMP 04/24/2013 General:   Alert,  pleasant and cooperative in NAD Head:  Normocephalic and atraumatic. Neck:  Supple; no masses or thyromegaly. Lungs:  Clear throughout to auscultation.    Heart:  Regular rate and rhythm. Abdomen:  Soft, nontender and nondistended. Normal bowel sounds, without guarding, and without rebound.   Neurologic:  Alert and  oriented x4;  grossly normal neurologically.  Impression/Plan: Patty Leon is here for an colonoscopy to be performed for polyp surveillance  Risks, benefits, limitations, and alternatives regarding  colonoscopy have been reviewed with the patient.  Questions have been answered.  All parties agreeable.   Josefine Class, MD  01/22/2015, 8:07 AM

## 2015-01-22 NOTE — Transfer of Care (Signed)
Immediate Anesthesia Transfer of Care Note  Patient: Patty Leon  Procedure(s) Performed: Procedure(s): COLONOSCOPY (N/A)  Patient Location: PACU  Anesthesia Type:General  Level of Consciousness: awake, alert  and oriented  Airway & Oxygen Therapy: Patient Spontanous Breathing and Patient connected to nasal cannula oxygen  Post-op Assessment: Report given to RN and Post -op Vital signs reviewed and stable  Post vital signs: Reviewed and stable  Last Vitals:  Filed Vitals:   01/22/15 0701  BP: 145/80  Pulse: 93  Temp: 36.9 C  Resp: 20    Complications: No apparent anesthesia complications

## 2015-01-22 NOTE — Anesthesia Preprocedure Evaluation (Signed)
Anesthesia Evaluation  Patient identified by MRN, date of birth, ID band  Reviewed: Allergy & Precautions, NPO status , Patient's Chart, lab work & pertinent test results  Airway Mallampati: II       Dental  (+) Upper Dentures   Pulmonary sleep apnea , COPD, Current Smoker,     + decreased breath sounds      Cardiovascular hypertension, Pt. on medications Normal cardiovascular exam     Neuro/Psych    GI/Hepatic negative GI ROS, Neg liver ROS,   Endo/Other  Morbid obesity  Renal/GU negative Renal ROS     Musculoskeletal   Abdominal (+) + obese,   Peds  Hematology   Anesthesia Other Findings   Reproductive/Obstetrics                             Anesthesia Physical Anesthesia Plan  ASA: III  Anesthesia Plan: General   Post-op Pain Management:    Induction: Intravenous  Airway Management Planned: Nasal Cannula  Additional Equipment:   Intra-op Plan:   Post-operative Plan:   Informed Consent: I have reviewed the patients History and Physical, chart, labs and discussed the procedure including the risks, benefits and alternatives for the proposed anesthesia with the patient or authorized representative who has indicated his/her understanding and acceptance.     Plan Discussed with: CRNA  Anesthesia Plan Comments:         Anesthesia Quick Evaluation

## 2015-01-22 NOTE — Anesthesia Postprocedure Evaluation (Signed)
  Anesthesia Post-op Note  Patient: Patty Leon  Procedure(s) Performed: Procedure(s): COLONOSCOPY (N/A)  Anesthesia type:General  Patient location: PACU  Post pain: Pain level controlled  Post assessment: Post-op Vital signs reviewed, Patient's Cardiovascular Status Stable, Respiratory Function Stable, Patent Airway and No signs of Nausea or vomiting  Post vital signs: Reviewed and stable  Last Vitals:  Filed Vitals:   01/22/15 0701  BP: 145/80  Pulse: 93  Temp: 36.9 C  Resp: 20    Level of consciousness: awake, alert  and patient cooperative  Complications: No apparent anesthesia complications

## 2015-01-22 NOTE — Op Note (Addendum)
[facility] Patient Name: Patty Leon Procedure Date: 01/22/2015 8:06 AM MRN: [id number] Account #: [id number] Date of Birth: 1960/04/29 Admit Type: Outpatient Age: 54 Room: [facility] ENDO ROOM 2 Gender: Female Note Status: Finalized Procedure:         Colonoscopy Indications:       High risk colon cancer surveillance: Personal history of                     adenoma with villous component, Last colonoscopy 1 year ago Patient Profile:   This is a 54 year old female. Providers:         Gerrit Heck. Rayann Heman, MD Referring MD:      Perrin Maltese, MD (Referring MD) Medicines:         Propofol per Anesthesia Complications:     No immediate complications. Procedure:         Pre-Anesthesia Assessment:                    - Prior to the procedure, a History and Physical was                     performed, and patient medications, allergies and                     sensitivities were reviewed. The patient's tolerance of                     previous anesthesia was reviewed.                    After obtaining informed consent, the colonoscope was                     passed under direct vision. Throughout the procedure, the                     patient's blood pressure, pulse, and oxygen saturations                     were monitored continuously. The Colonoscope was                     introduced through the anus and advanced to the the cecum,                     identified by appendiceal orifice and ileocecal valve. The                     colonoscopy was performed without difficulty. The patient                     tolerated the procedure well. The quality of the bowel                     preparation was excellent. Findings:      Four sessile polyps were found in the ascending colon. The polyps were 4       to 8 mm in size. These polyps were removed with a hot snare and cold       snare as appropriate Resection and retrieval were complete.      Three sessile polyps  were found in the transverse colon. The polyps were       3 to 6 mm in size. These polyps were removed with a  hot snare and cold       snare as appropriate . Resection and retrieval were complete.      Three sessile polyps were found in the descending colon. The polyps were       3 to 7 mm in size. These polyps were removed with a hot snare and cold       snare as appropriate . Resection and retrieval were complete.      Three sessile polyps were found in the sigmoid colon. The polyps were 3       to 5 mm in size. These polyps were removed with a cold snare. Resection       and retrieval were complete.      A 15 mm polyp was found in the sigmoid colon. The polyp was       pedunculated. The polyp was removed with a hot snare. Resection and       retrieval were complete.      Many small and large-mouthed diverticula were found in the sigmoid colon.      The exam was otherwise without abnormality. Impression:        - Four 4 to 8 mm polyps in the ascending colon. Resected                     and retrieved.                    - Three 3 to 6 mm polyps in the transverse colon. Resected                     and retrieved.                    - Three 3 to 7 mm polyps in the descending colon. Resected                     and retrieved.                    - Three 3 to 5 mm polyps in the sigmoid colon. Resected                     and retrieved.                    - One 15 mm polyp in the sigmoid colon. Resected and                     retrieved.                    - Diverticulosis in the sigmoid colon.                    - The examination was otherwise normal. Recommendation:    - Observe patient in GI recovery unit.                    - Continue present medications.                    - Await pathology results.                    - Repeat colonoscopy for surveillance based on pathology                     results.                    -  Return to referring physician.                    - The  findings and recommendations were discussed with the                     patient.                    - The findings and recommendations were discussed with the                     patient's family. Procedure Code(s): --- Professional ---                    [phone], Colonoscopy, flexible; with removal of tumor(s),                     polyp(s), or other lesion(s) by snare technique Diagnosis Code(s): --- Professional ---                    Z86.010, Personal history of colonic polyps                    D12.2, Benign neoplasm of ascending colon                    D12.3, Benign neoplasm of transverse colon                    D12.4, Benign neoplasm of descending colon                    D12.5, Benign neoplasm of sigmoid colon                    K57.30, Diverticulosis of large intestine without                     perforation or abscess without bleeding CPT copyright 2014 American Medical Association. All rights reserved. The codes documented in this report are preliminary and upon coder review may  be revised to meet current compliance requirements. Mellody Life, MD 01/22/2015 8:54:56 AM This report has been signed electronically. Number of Addenda: 0 Note Initiated On: 01/22/2015 8:06 AM Scope Withdrawal Time: 0 hours 29 minutes 37 seconds  Total Procedure Duration: 0 hours 31 minutes 20 seconds       [facility]

## 2015-01-25 LAB — SURGICAL PATHOLOGY

## 2015-01-27 ENCOUNTER — Encounter: Payer: Self-pay | Admitting: Gastroenterology

## 2015-02-09 IMAGING — MG MM DIGITAL SCREENING BILAT W/ CAD
4 series · 4 of 4 positions shown · non-contrast
Comparison: Previous exam(s).

CLINICAL DATA: Screening.

EXAM:
DIGITAL SCREENING BILATERAL MAMMOGRAM WITH CAD

[L MLO]
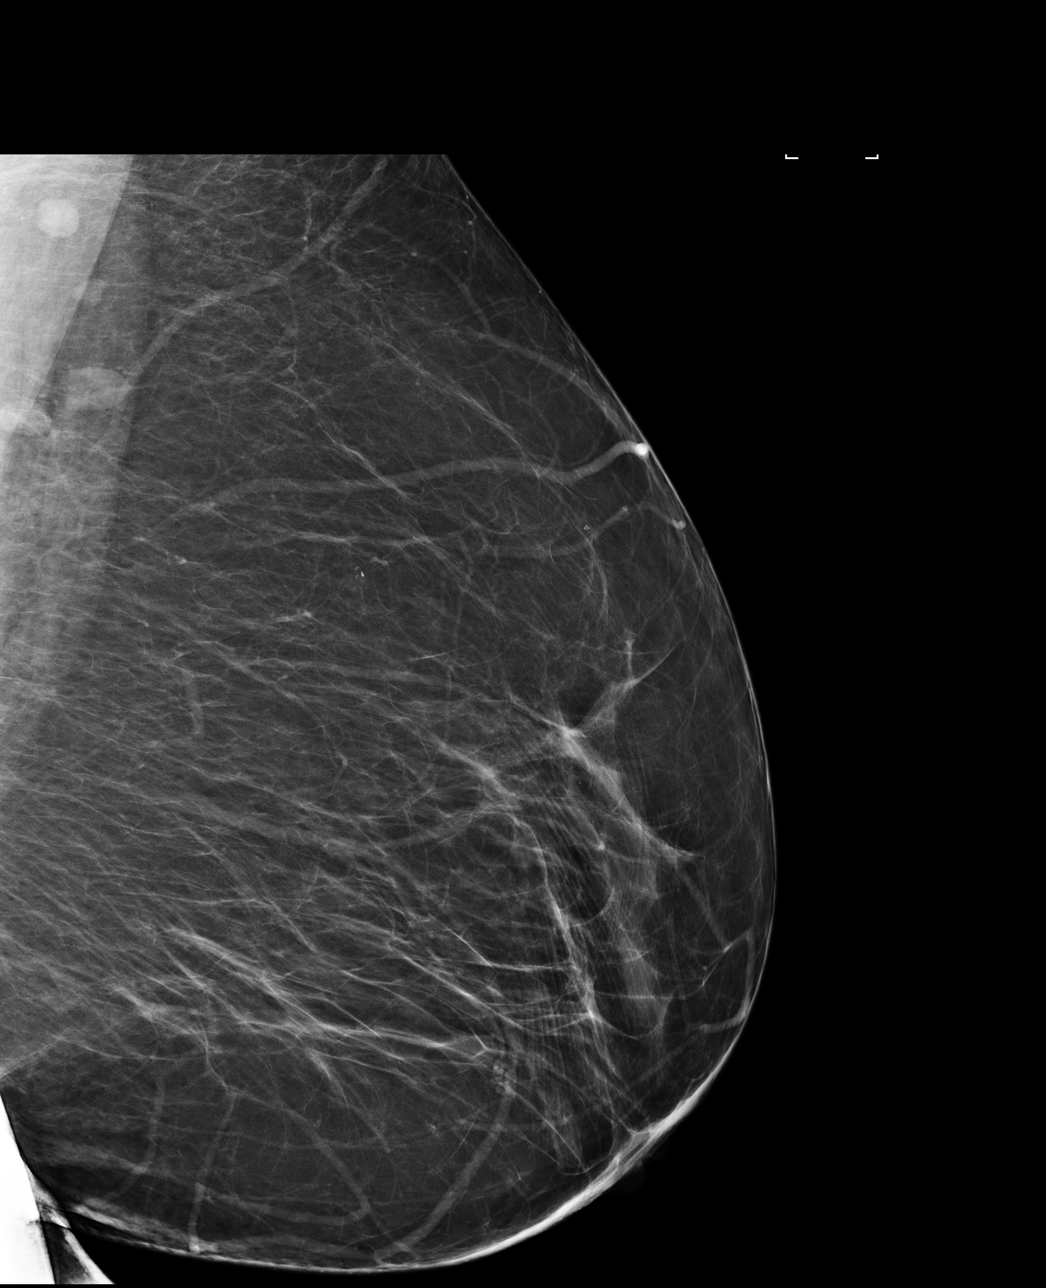

[R CC]
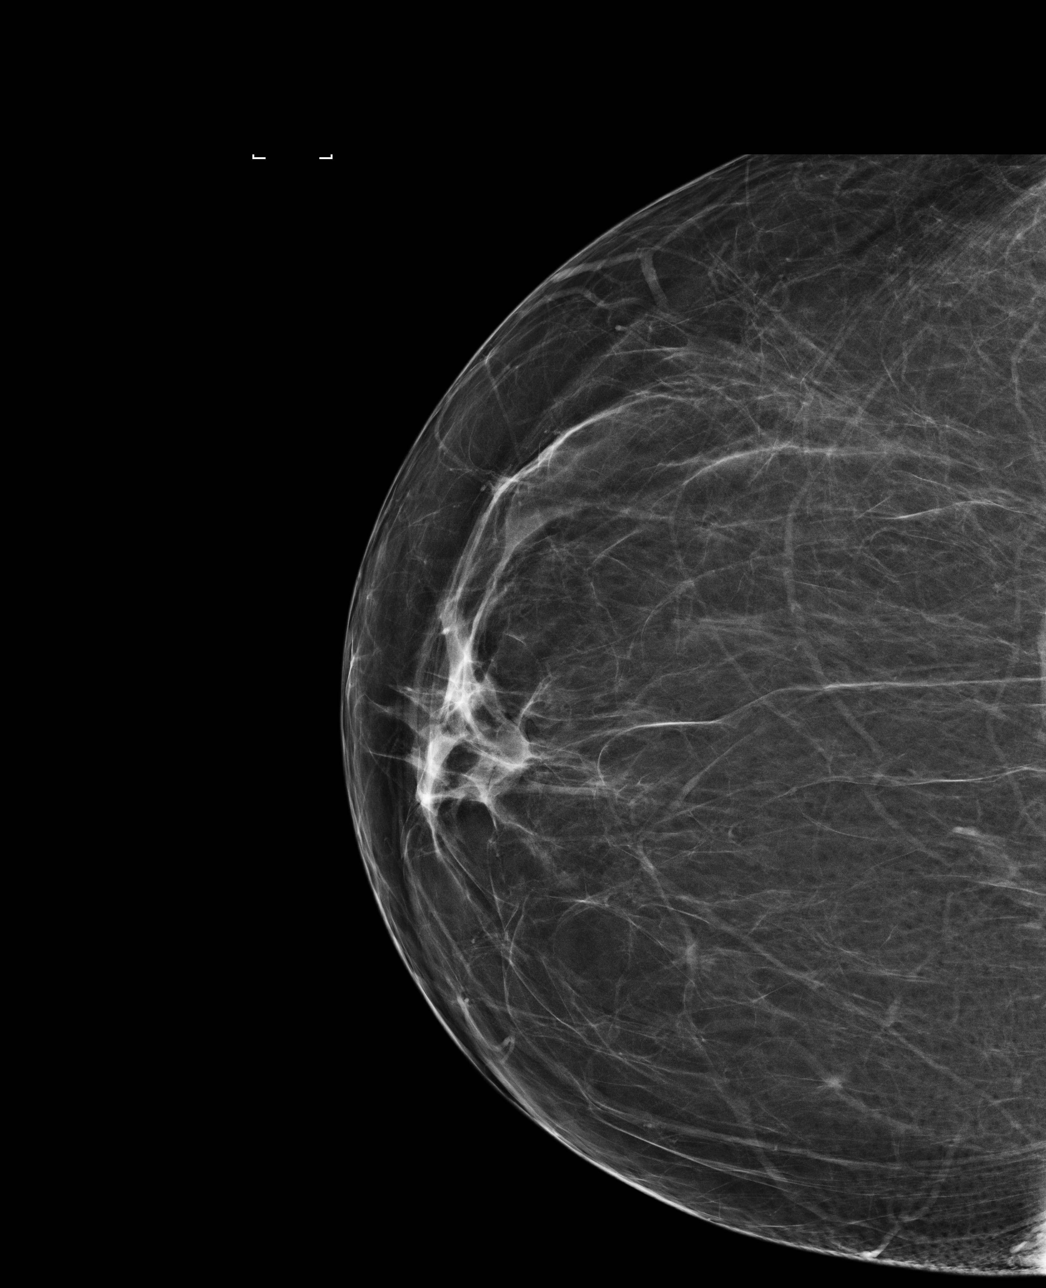

[R MLO]
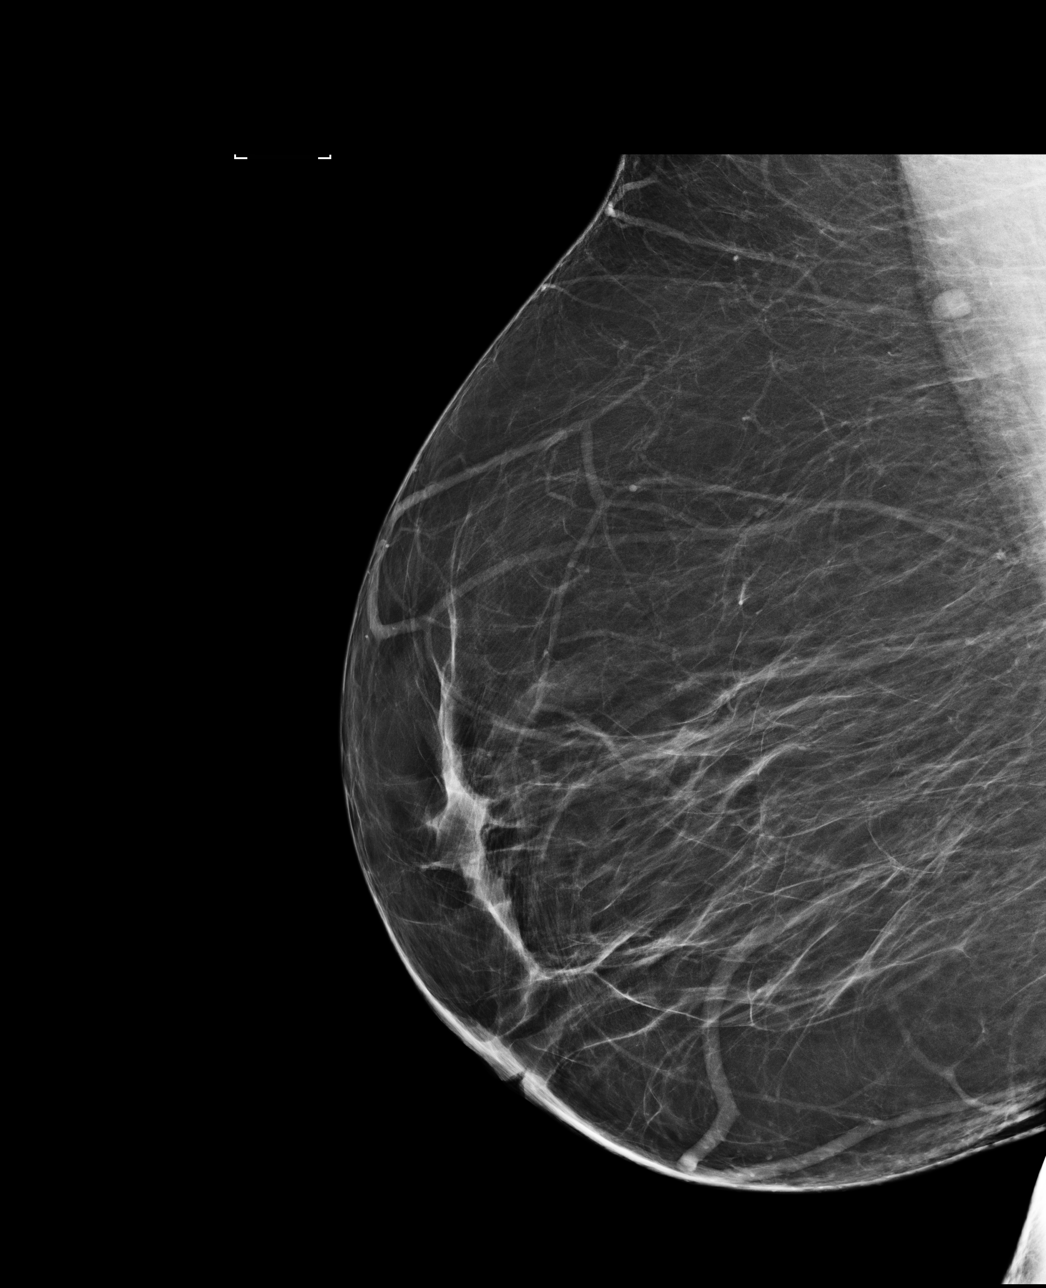

[L CC]
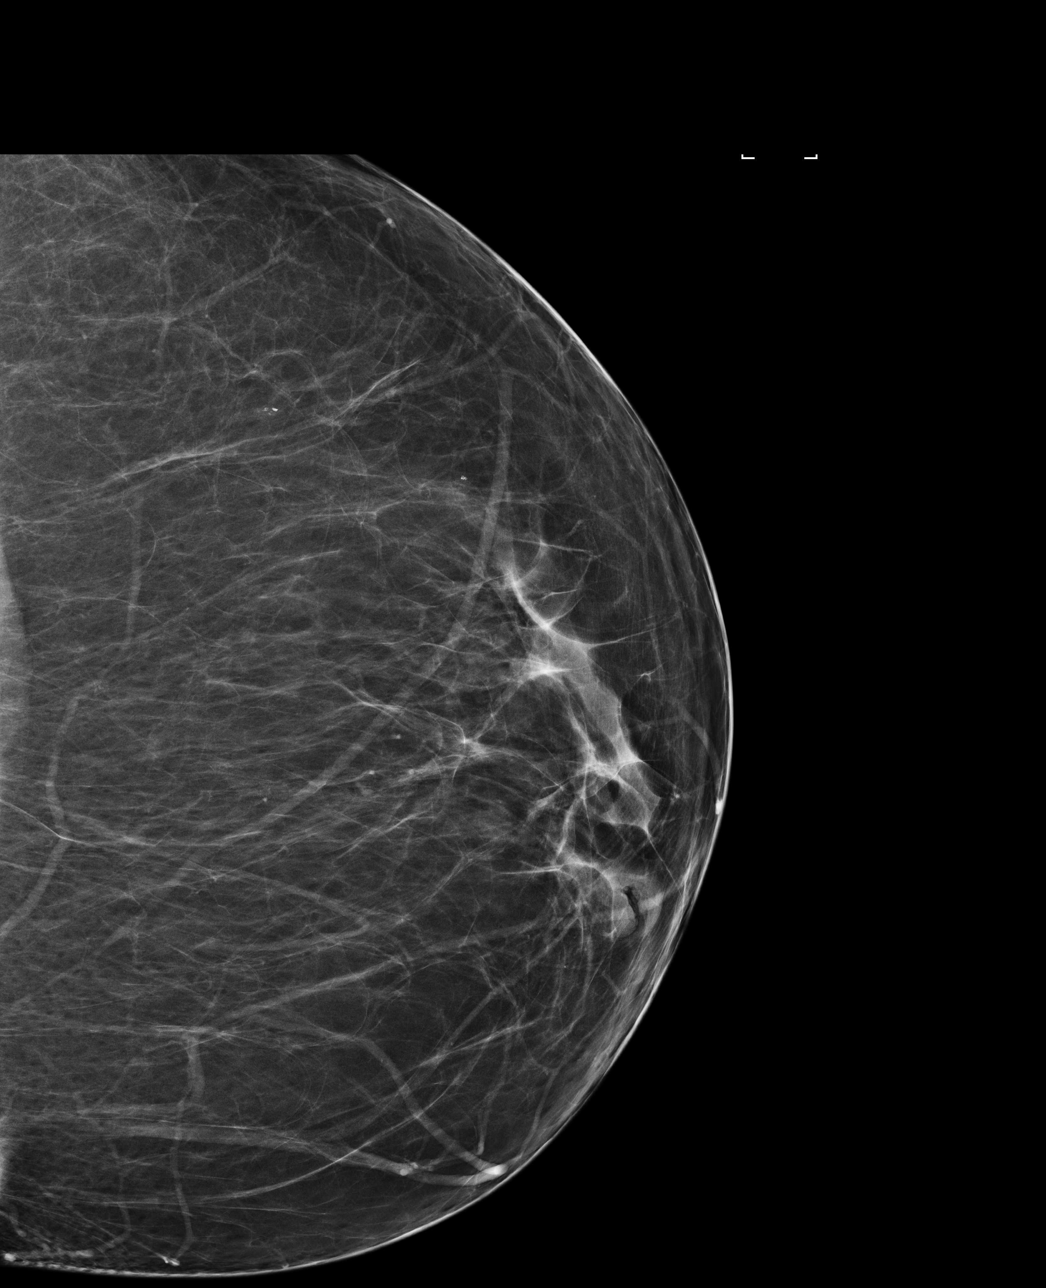

[4 of 4 positions shown; findings below may reference images not displayed]

ACR Breast Density Category b: There are scattered areas of
fibroglandular density.
FINDINGS: There are no findings suspicious for malignancy. Images were
processed with CAD.
IMPRESSION: No mammographic evidence of malignancy. A result letter of this
screening mammogram will be mailed directly to the patient.

RECOMMENDATION:
Screening mammogram in one year. (Code:AS-G-LCT)

BI-RADS CATEGORY  1: Negative.

## 2015-03-27 ENCOUNTER — Emergency Department
Admission: EM | Admit: 2015-03-27 | Discharge: 2015-03-27 | Disposition: A | Discharge status: Home / Self Care | Payer: Medicaid Other | Attending: Emergency Medicine | Admitting: Emergency Medicine

## 2015-03-27 ENCOUNTER — Encounter: Payer: Self-pay | Admitting: Medical Oncology

## 2015-03-27 DIAGNOSIS — M545 Low back pain: Secondary | ICD-10-CM | POA: Diagnosis present

## 2015-03-27 DIAGNOSIS — M5442 Lumbago with sciatica, left side: Secondary | ICD-10-CM | POA: Diagnosis not present

## 2015-03-27 DIAGNOSIS — G8929 Other chronic pain: Secondary | ICD-10-CM | POA: Insufficient documentation

## 2015-03-27 DIAGNOSIS — F17210 Nicotine dependence, cigarettes, uncomplicated: Secondary | ICD-10-CM | POA: Diagnosis not present

## 2015-03-27 DIAGNOSIS — M5441 Lumbago with sciatica, right side: Secondary | ICD-10-CM | POA: Diagnosis not present

## 2015-03-27 DIAGNOSIS — Z79899 Other long term (current) drug therapy: Secondary | ICD-10-CM | POA: Diagnosis not present

## 2015-03-27 DIAGNOSIS — Z7951 Long term (current) use of inhaled steroids: Secondary | ICD-10-CM | POA: Insufficient documentation

## 2015-03-27 DIAGNOSIS — I10 Essential (primary) hypertension: Secondary | ICD-10-CM | POA: Diagnosis not present

## 2015-03-27 MED ORDER — PREDNISONE 20 MG PO TABS
30.0000 mg | ORAL_TABLET | Freq: Once | ORAL | Status: AC
  Administered 2015-03-27: 30 mg via ORAL
  Filled 2015-03-27: qty 1

## 2015-03-27 MED ORDER — PREDNISONE 10 MG PO TABS: ORAL_TABLET | ORAL | Status: DC

## 2015-03-27 NOTE — ED Notes (Signed)
Pt reports hx of back pain, began last night having lower back pain with radiation of pain into buttocks. Denies injury.

## 2015-03-27 NOTE — Discharge Instructions (Signed)
Chronic Back Pain  When back pain lasts longer than 3 months, it is called chronic back pain.People with chronic back pain often go through certain periods that are more intense (flare-ups).  CAUSES Chronic back pain can be caused by wear and tear (degeneration) on different structures in your back. These structures include:  The bones of your spine (vertebrae) and the joints surrounding your spinal cord and nerve roots (facets).  The strong, fibrous tissues that connect your vertebrae (ligaments). Degeneration of these structures may result in pressure on your nerves. This can lead to constant pain. HOME CARE INSTRUCTIONS  Avoid bending, heavy lifting, prolonged sitting, and activities which make the problem worse.  Take brief periods of rest throughout the day to reduce your pain. Lying down or standing usually is better than sitting while you are resting.  Take over-the-counter or prescription medicines only as directed by your caregiver. SEEK IMMEDIATE MEDICAL CARE IF:   You have weakness or numbness in one of your legs or feet.  You have trouble controlling your bladder or bowels.  You have nausea, vomiting, abdominal pain, shortness of breath, or fainting.   This information is not intended to replace advice given to you by your health care provider. Make sure you discuss any questions you have with your health care provider.   Document Released: 04/06/2004 Document Revised: 05/22/2011 Document Reviewed: 08/17/2014 Elsevier Interactive Patient Education 2016 [place] will need to keep your appointment with your pain clinic on Monday. Let your doctor know that you're currently taking prednisone Continue taking your medication as directed. Begin taking prednisone today 3 tablets once a day for 3 days.

## 2015-03-27 NOTE — ED Provider Notes (Signed)
[facility] Emergency Department Provider Note  ____________________________________________  Time seen: Approximately 9:53 AM  I have reviewed the triage vital signs and the nursing notes.   HISTORY  Chief Complaint Back Pain   HPI Patty Leon is a 55 y.o. female complaint of back pain. Patient states that she has low back pain with radiation into her buttocks. Currently she takes oxycodone for her pain and is in a pain clinic in [place]. Patient states that she has an appointment on Monday at the same clinic. Currently she complains of radiation into both legs. She continues to be ambulatory, she denies any incontinence of bowel or bladder, and this feels the same as it did the last time this occurred.   Past Medical History  Diagnosis Date  . Sleep apnea   . COPD (chronic obstructive pulmonary disease) ([place])   . Fluid retention   . Hypertension   . Asthma     There are no active problems to display for this patient.   Past Surgical History  Procedure Laterality Date  . Colonoscopy    . Colonoscopy N/A 01/22/2015    Procedure: COLONOSCOPY;  Surgeon: Josefine Class, MD;  Location: [facility] ENDOSCOPY;  Service: Endoscopy;  Laterality: N/A;    Current Outpatient Rx  Name  Route  Sig  Dispense  Refill  . amLODipine (NORVASC) 5 MG tablet   Oral   Take 5 mg by mouth daily.         . cholecalciferol (VITAMIN D) 1000 UNITS tablet   Oral   Take 2,000 Units by mouth daily.         . Fluticasone-Salmeterol (ADVAIR) 250-50 MCG/DOSE AEPB   Inhalation   Inhale 1 puff into the lungs 2 (two) times daily.         . hydrochlorothiazide (MICROZIDE) 12.5 MG capsule   Oral   Take 12.5 mg by mouth daily.         . metoprolol succinate (TOPROL-XL) 50 MG 24 hr tablet   Oral   Take 50 mg by mouth daily. Take with or immediately following a meal.         . oxyCODONE (OXY IR/ROXICODONE) 5 MG immediate release tablet   Oral   Take 1 tablet (5  mg total) by mouth once.   15 tablet   0   . predniSONE (DELTASONE) 10 MG tablet      Take 3 tab once a day for 3 days   9 tablet   0   . roflumilast (DALIRESP) 500 MCG TABS tablet   Oral   Take 500 mcg by mouth daily.         . rosuvastatin (CRESTOR) 20 MG tablet   Oral   Take 20 mg by mouth daily.         Marland Kitchen tiotropium (SPIRIVA) 18 MCG inhalation capsule   Inhalation   Place 18 mcg into inhaler and inhale daily.           Allergies Celebrex  No family history on file.  Social History Social History  Substance Use Topics  . Smoking status: Current Every Day Smoker -- 0.50 packs/day for 30 years    Types: Cigarettes  . Smokeless tobacco: Never Used  . Alcohol Use: No    Review of Systems Constitutional: No fever/chills ENT: No trauma Cardiovascular: Denies chest pain. Respiratory: Denies shortness of breath. Gastrointestinal: No abdominal pain.  No nausea, no vomiting.  Musculoskeletal: Negative for chronic back pain. Skin: Negative  for rash. Neurological: Negative for headaches, focal weakness or numbness.  10-point ROS otherwise negative.  ____________________________________________   PHYSICAL EXAM:  VITAL SIGNS: ED Triage Vitals  Enc Vitals Group     BP 03/27/15 0853 147/93 mmHg     Pulse Rate 03/27/15 0853 109     Resp 03/27/15 0853 20     Temp 03/27/15 0853 97.8 F (36.6 C)     Temp Source 03/27/15 0853 Oral     SpO2 03/27/15 0853 92 %     Weight 03/27/15 0852 280 lb (127.007 kg)     Height 03/27/15 0853 5\' 2"  (1.575 m)     Head Cir --      Peak Flow --      Pain Score 03/27/15 0852 10     Pain Loc --      Pain Edu? --      Excl. in [place]? --     Constitutional: Alert and oriented. Well appearing and in no acute distress. Eyes: Conjunctivae are normal. PERRL. EOMI. Head: Atraumatic. Nose: No congestion/rhinnorhea. Neck: No stridor.   Cardiovascular: Normal rate, regular rhythm. Grossly normal heart sounds.  Good peripheral  circulation. Respiratory: Normal respiratory effort.  No retractions. Lungs CTAB. Gastrointestinal: Soft and nontender. No distention. Musculoskeletal: Back no gross deformity was noted. There is tenderness on palpation of the lower lumbar sacral area and paravertebral muscles to palpation. No active muscle spasms are seen. Normal gait was noted. Neurologic:  Normal speech and language. No gross focal neurologic deficits are appreciated. No gait instability. He flexes 1+ bilaterally. Skin:  Skin is warm, dry and intact. No rash noted. Psychiatric: Mood and affect are normal. Speech and behavior are normal.  ____________________________________________   LABS (all labs ordered are listed, but only abnormal results are displayed)  Labs Reviewed - No data to display  PROCEDURES  Procedure(s) performed: None  Critical Care performed: No  ____________________________________________   INITIAL IMPRESSION / ASSESSMENT AND PLAN / ED COURSE  Pertinent labs & imaging results that were available during my care of the patient were reviewed by me and considered in my medical decision making (see chart for details).  She was given prednisone while in the emergency room as well as a prescription for same. Patient states she has an appointment Monday with her pain clinic in [place] and she is encouraged to keep that appointment. She was not given any narcotics today as she is supposed to have some at home per the website. ____________________________________________   FINAL CLINICAL IMPRESSION(S) / ED DIAGNOSES  Final diagnoses:  Chronic midline low back pain with bilateral sciatica     Johnn Hai, PA-C 03/27/15 1650  Earleen Newport, MD 03/28/15 [phone]

## 2015-06-01 ENCOUNTER — Ambulatory Visit: Payer: Medicaid Other | Attending: Nurse Practitioner

## 2015-06-15 ENCOUNTER — Ambulatory Visit
Admission: RE | Admit: 2015-06-15 | Discharge: 2015-06-15 | Disposition: A | Discharge status: Home / Self Care | Payer: Medicaid Other | Source: Ambulatory Visit | Attending: Nurse Practitioner | Admitting: Nurse Practitioner

## 2015-06-15 DIAGNOSIS — Z1231 Encounter for screening mammogram for malignant neoplasm of breast: Secondary | ICD-10-CM | POA: Diagnosis present

## 2015-08-23 ENCOUNTER — Other Ambulatory Visit: Payer: Self-pay | Admitting: Orthopedic Surgery

## 2015-08-23 DIAGNOSIS — M1712 Unilateral primary osteoarthritis, left knee: Secondary | ICD-10-CM

## 2015-08-23 DIAGNOSIS — M1711 Unilateral primary osteoarthritis, right knee: Secondary | ICD-10-CM

## 2015-09-01 ENCOUNTER — Ambulatory Visit
Admission: RE | Admit: 2015-09-01 | Discharge: 2015-09-01 | Disposition: A | Discharge status: Home / Self Care | Payer: Medicaid Other | Source: Ambulatory Visit | Attending: Orthopedic Surgery | Admitting: Orthopedic Surgery

## 2015-09-01 DIAGNOSIS — M25462 Effusion, left knee: Secondary | ICD-10-CM | POA: Insufficient documentation

## 2015-09-01 DIAGNOSIS — M1711 Unilateral primary osteoarthritis, right knee: Secondary | ICD-10-CM

## 2015-09-01 DIAGNOSIS — M1712 Unilateral primary osteoarthritis, left knee: Secondary | ICD-10-CM

## 2015-09-02 ENCOUNTER — Other Ambulatory Visit: Payer: Self-pay | Admitting: Student

## 2015-09-02 DIAGNOSIS — R945 Abnormal results of liver function studies: Secondary | ICD-10-CM

## 2015-09-02 DIAGNOSIS — R772 Abnormality of alphafetoprotein: Secondary | ICD-10-CM

## 2015-09-02 DIAGNOSIS — R932 Abnormal findings on diagnostic imaging of liver and biliary tract: Secondary | ICD-10-CM

## 2015-09-11 HISTORY — PX: JOINT REPLACEMENT: SHX530

## 2015-09-16 ENCOUNTER — Ambulatory Visit
Admission: RE | Admit: 2015-09-16 | Discharge: 2015-09-16 | Disposition: A | Discharge status: Home / Self Care | Payer: Medicaid Other | Source: Ambulatory Visit | Attending: Student | Admitting: Student

## 2015-09-16 DIAGNOSIS — D259 Leiomyoma of uterus, unspecified: Secondary | ICD-10-CM | POA: Insufficient documentation

## 2015-09-16 DIAGNOSIS — R932 Abnormal findings on diagnostic imaging of liver and biliary tract: Secondary | ICD-10-CM

## 2015-09-16 DIAGNOSIS — K5730 Diverticulosis of large intestine without perforation or abscess without bleeding: Secondary | ICD-10-CM | POA: Insufficient documentation

## 2015-09-16 DIAGNOSIS — D3502 Benign neoplasm of left adrenal gland: Secondary | ICD-10-CM | POA: Diagnosis not present

## 2015-09-16 DIAGNOSIS — R945 Abnormal results of liver function studies: Secondary | ICD-10-CM | POA: Diagnosis present

## 2015-09-16 DIAGNOSIS — R772 Abnormality of alphafetoprotein: Secondary | ICD-10-CM | POA: Insufficient documentation

## 2015-09-16 MED ORDER — IOPAMIDOL (ISOVUE-300) INJECTION 61%
100.0000 mL | Freq: Once | INTRAVENOUS | Status: AC | PRN
  Administered 2015-09-16: 100 mL via INTRAVENOUS

## 2015-09-22 ENCOUNTER — Encounter
Admission: RE | Admit: 2015-09-22 | Discharge: 2015-09-22 | Disposition: A | Discharge status: Home / Self Care | Payer: Medicaid Other | Source: Ambulatory Visit | Attending: Orthopedic Surgery | Admitting: Orthopedic Surgery

## 2015-09-22 DIAGNOSIS — Z01812 Encounter for preprocedural laboratory examination: Secondary | ICD-10-CM | POA: Diagnosis present

## 2015-09-22 DIAGNOSIS — Z01810 Encounter for preprocedural cardiovascular examination: Secondary | ICD-10-CM | POA: Diagnosis present

## 2015-09-22 HISTORY — DX: Pure hypercholesterolemia, unspecified: E78.00

## 2015-09-22 HISTORY — DX: Diverticulosis of intestine, part unspecified, without perforation or abscess without bleeding: K57.90

## 2015-09-22 HISTORY — DX: Type 2 diabetes mellitus without complications: E11.9

## 2015-09-22 HISTORY — DX: Unspecified osteoarthritis, unspecified site: M19.90

## 2015-09-22 HISTORY — DX: Gastro-esophageal reflux disease without esophagitis: K21.9

## 2015-09-22 HISTORY — DX: Anxiety disorder, unspecified: F41.9

## 2015-09-22 HISTORY — DX: Reserved for inherently not codable concepts without codable children: IMO0001

## 2015-09-22 LAB — TYPE AND SCREEN
ABO/RH(D): O POS
ANTIBODY SCREEN: NEGATIVE

## 2015-09-22 LAB — URINALYSIS COMPLETE WITH MICROSCOPIC (ARMC ONLY)
BILIRUBIN URINE: NEGATIVE
GLUCOSE, UA: NEGATIVE mg/dL
KETONES UR: NEGATIVE mg/dL
LEUKOCYTES UA: NEGATIVE
NITRITE: NEGATIVE
PH: 6 (ref 5.0–8.0)
Protein, ur: NEGATIVE mg/dL
Specific Gravity, Urine: 1.011 (ref 1.005–1.030)

## 2015-09-22 LAB — CBC
HCT: 44.6 % (ref 35.0–47.0)
Hemoglobin: 14.9 g/dL (ref 12.0–16.0)
MCH: 30.3 pg (ref 26.0–34.0)
MCHC: 33.4 g/dL (ref 32.0–36.0)
MCV: 90.8 fL (ref 80.0–100.0)
PLATELETS: 262 10*3/uL (ref 150–440)
RBC: 4.91 MIL/uL (ref 3.80–5.20)
RDW: 13.8 % (ref 11.5–14.5)
WBC: 7.7 10*3/uL (ref 3.6–11.0)

## 2015-09-22 LAB — BASIC METABOLIC PANEL
Anion gap: 8 (ref 5–15)
BUN: 12 mg/dL (ref 6–20)
CO2: 38 mmol/L — ABNORMAL HIGH (ref 22–32)
CREATININE: 0.49 mg/dL (ref 0.44–1.00)
Calcium: 9.8 mg/dL (ref 8.9–10.3)
Chloride: 92 mmol/L — ABNORMAL LOW (ref 101–111)
GFR calc Af Amer: >60 mL/min (ref >60)
GLUCOSE: 155 mg/dL — AB (ref 65–99)
Potassium: 3.3 mmol/L — ABNORMAL LOW (ref 3.5–5.1)
Sodium: 138 mmol/L (ref 135–145)

## 2015-09-22 LAB — SURGICAL PCR SCREEN
MRSA, PCR: NEGATIVE
Staphylococcus aureus: NEGATIVE

## 2015-09-22 LAB — PROTIME-INR
INR: 0.86
PROTHROMBIN TIME: 12 s (ref 11.4–15.0)

## 2015-09-22 LAB — SEDIMENTATION RATE: Sed Rate: 31 mm/hr — ABNORMAL HIGH (ref 0–30)

## 2015-09-22 LAB — APTT: APTT: 32 s (ref 24–36)

## 2015-09-22 NOTE — Patient Instructions (Signed)
  Your procedure is scheduled [id number] 25, 2017 (Tuesday) Report to Same Day Surgery 2nd floor Medical Mall To find out your arrival time please call [phone] between 1PM - 3PM on October 04, 2015 (Monday)  Remember: Instructions that are not followed completely may result in serious medical risk, up to and including death, or upon the discretion of your surgeon and anesthesiologist your surgery may need to be rescheduled.    _x___ 1. Do not eat food or drink liquids after midnight. No gum chewing or hard candies.     __x__ 2. No Alcohol for 24 hours before or after surgery.   __x__3. No Smoking for 24 prior to surgery.   ____  4. Bring all medications with you on the day of surgery if instructed.    __x__ 5. Notify your doctor if there is any change in your medical condition     (cold, fever, infections).     Do not wear jewelry, make-up, hairpins, clips or nail polish.  Do not wear lotions, powders, or perfumes. You may wear deodorant.  Do not shave 48 hours prior to surgery. Men may shave face and neck.  Do not bring valuables to the hospital.    [facility] is not responsible for any belongings or valuables.               Contacts, dentures or bridgework may not be worn into surgery.  Leave your suitcase in the car. After surgery it may be brought to your room.  For patients admitted to the hospital, discharge time is determined by your treatment team.   Patients discharged the day of surgery will not be allowed to drive home.    Please read over the following fact sheets that you were given:   [facility] Preparing for Surgery and or MRSA Information   _x___ Take these medicines the morning of surgery with A SIP OF WATER:    1. amLODipine (NORVASC) 5 MG table  2.metoprolol (LOPRESSOR) 50 MG tablet  3.omeprazole (PRILOSEC) 40 MG capsule (Omeprazole at bedtime on July 24)  4.roflumilast (DALIRESP) 500 MCG TABS tablet  ____ Fleet Enema (as directed)   _x___ Use CHG Soap  or sage wipes as directed on instruction sheet   _x___ Use inhalers on the day of surgery and bring to hospital day of surgery (Use both study inhalers the morning of surgery and bring with you to hospital)  _x___ Stop metformin 2 days prior to surgery (Stop Metformin on July 23)    ____ Take 1/2 of usual insulin dose the night before surgery and none on the morning of surgery          _x___ Stop aspirin or coumadin, or plavix (N/A)  _x__ Stop Anti-inflammatories such as Advil, Aleve, Ibuprofen, Motrin, Naproxen,          Naprosyn, Goodies powders or aspirin products. Ok to take Tylenol.   _x___ Stop supplements until after surgery.  (Stop Voltaren Gel one week prior to surgery)  _x___ Bring C-Pap to the hospital.

## 2015-09-22 NOTE — Pre-Procedure Instructions (Signed)
Potassium results faxed and called to Dr. Rudene Christians office (spoke to [place])

## 2015-09-23 LAB — URINE CULTURE: Culture: NO GROWTH

## 2015-10-07 ENCOUNTER — Encounter
Admission: RE | Disposition: A | Discharge status: Home Health | Payer: Self-pay | Source: Ambulatory Visit | Attending: Orthopedic Surgery

## 2015-10-07 ENCOUNTER — Inpatient Hospital Stay: Payer: Medicaid Other | Admitting: Anesthesiology

## 2015-10-07 ENCOUNTER — Encounter: Payer: Self-pay | Admitting: *Deleted

## 2015-10-07 ENCOUNTER — Inpatient Hospital Stay
Admission: RE | Admit: 2015-10-07 | Discharge: 2015-10-10 | DRG: 470 | Disposition: A | Discharge status: Home Health | Payer: Medicaid Other | Source: Ambulatory Visit | Attending: Orthopedic Surgery | Admitting: Orthopedic Surgery

## 2015-10-07 ENCOUNTER — Inpatient Hospital Stay: Payer: Medicaid Other

## 2015-10-07 DIAGNOSIS — F419 Anxiety disorder, unspecified: Secondary | ICD-10-CM | POA: Diagnosis present

## 2015-10-07 DIAGNOSIS — J449 Chronic obstructive pulmonary disease, unspecified: Secondary | ICD-10-CM | POA: Diagnosis present

## 2015-10-07 DIAGNOSIS — Z6841 Body Mass Index (BMI) 40.0 and over, adult: Secondary | ICD-10-CM

## 2015-10-07 DIAGNOSIS — Z9981 Dependence on supplemental oxygen: Secondary | ICD-10-CM

## 2015-10-07 DIAGNOSIS — M1712 Unilateral primary osteoarthritis, left knee: Secondary | ICD-10-CM | POA: Diagnosis present

## 2015-10-07 DIAGNOSIS — F17210 Nicotine dependence, cigarettes, uncomplicated: Secondary | ICD-10-CM | POA: Diagnosis present

## 2015-10-07 DIAGNOSIS — Z8249 Family history of ischemic heart disease and other diseases of the circulatory system: Secondary | ICD-10-CM

## 2015-10-07 DIAGNOSIS — Z888 Allergy status to other drugs, medicaments and biological substances status: Secondary | ICD-10-CM | POA: Diagnosis not present

## 2015-10-07 DIAGNOSIS — K219 Gastro-esophageal reflux disease without esophagitis: Secondary | ICD-10-CM | POA: Diagnosis present

## 2015-10-07 DIAGNOSIS — Z86010 Personal history of colonic polyps: Secondary | ICD-10-CM

## 2015-10-07 DIAGNOSIS — E7800 Pure hypercholesterolemia, unspecified: Secondary | ICD-10-CM | POA: Diagnosis present

## 2015-10-07 DIAGNOSIS — E876 Hypokalemia: Secondary | ICD-10-CM | POA: Diagnosis not present

## 2015-10-07 DIAGNOSIS — G4730 Sleep apnea, unspecified: Secondary | ICD-10-CM | POA: Diagnosis present

## 2015-10-07 DIAGNOSIS — Z7984 Long term (current) use of oral hypoglycemic drugs: Secondary | ICD-10-CM | POA: Diagnosis not present

## 2015-10-07 DIAGNOSIS — Z79899 Other long term (current) drug therapy: Secondary | ICD-10-CM

## 2015-10-07 DIAGNOSIS — G8918 Other acute postprocedural pain: Secondary | ICD-10-CM

## 2015-10-07 DIAGNOSIS — M25562 Pain in left knee: Secondary | ICD-10-CM | POA: Diagnosis present

## 2015-10-07 DIAGNOSIS — I10 Essential (primary) hypertension: Secondary | ICD-10-CM | POA: Diagnosis present

## 2015-10-07 DIAGNOSIS — E119 Type 2 diabetes mellitus without complications: Secondary | ICD-10-CM | POA: Diagnosis present

## 2015-10-07 HISTORY — PX: TOTAL KNEE ARTHROPLASTY: SHX125

## 2015-10-07 LAB — GLUCOSE, CAPILLARY
GLUCOSE-CAPILLARY: 112 mg/dL — AB (ref 65–99)
GLUCOSE-CAPILLARY: 130 mg/dL — AB (ref 65–99)
Glucose-Capillary: 92 mg/dL (ref 65–99)

## 2015-10-07 LAB — CREATININE, SERUM
CREATININE: 0.47 mg/dL (ref 0.44–1.00)
GFR calc non Af Amer: >60 mL/min (ref >60)

## 2015-10-07 LAB — POCT I-STAT 4, (NA,K, GLUC, HGB,HCT)
Glucose, Bld: 116 mg/dL — ABNORMAL HIGH (ref 65–99)
HEMATOCRIT: 43 % (ref 36.0–46.0)
HEMOGLOBIN: 14.6 g/dL (ref 12.0–15.0)
Potassium: 3.4 mmol/L — ABNORMAL LOW (ref 3.5–5.1)
SODIUM: 141 mmol/L (ref 135–145)

## 2015-10-07 LAB — TYPE AND SCREEN
ABO/RH(D): O POS
Antibody Screen: NEGATIVE

## 2015-10-07 LAB — CBC
HCT: 42.5 % (ref 35.0–47.0)
HEMOGLOBIN: 13.9 g/dL (ref 12.0–16.0)
MCH: 30 pg (ref 26.0–34.0)
MCHC: 32.7 g/dL (ref 32.0–36.0)
MCV: 91.6 fL (ref 80.0–100.0)
PLATELETS: 246 10*3/uL (ref 150–440)
RBC: 4.65 MIL/uL (ref 3.80–5.20)
RDW: 13.6 % (ref 11.5–14.5)
WBC: 7.1 10*3/uL (ref 3.6–11.0)

## 2015-10-07 SURGERY — ARTHROPLASTY, KNEE, TOTAL
Anesthesia: General | Site: Knee | Laterality: Left | Wound class: Clean

## 2015-10-07 MED ORDER — SODIUM CHLORIDE 0.9 % IV SOLN
INTRAVENOUS | Status: DC
  Administered 2015-10-07: 19:00:00 via INTRAVENOUS

## 2015-10-07 MED ORDER — PHENOL 1.4 % MT LIQD
1.0000 | OROMUCOSAL | Status: DC | PRN
  Filled 2015-10-07: qty 177

## 2015-10-07 MED ORDER — FENTANYL CITRATE (PF) 100 MCG/2ML IJ SOLN
INTRAMUSCULAR | Status: AC
  Administered 2015-10-07: 25 ug via INTRAVENOUS
  Filled 2015-10-07: qty 2

## 2015-10-07 MED ORDER — CEFAZOLIN SODIUM-DEXTROSE 2-4 GM/100ML-% IV SOLN: 2.0000 g | Freq: Once | INTRAVENOUS | Status: DC

## 2015-10-07 MED ORDER — NON FORMULARY: 2.0000 | Status: DC | PRN

## 2015-10-07 MED ORDER — MAGNESIUM HYDROXIDE 400 MG/5ML PO SUSP
30.0000 mL | Freq: Every day | ORAL | Status: DC | PRN
  Administered 2015-10-09 (×2): 30 mL via ORAL
  Filled 2015-10-07 (×2): qty 30

## 2015-10-07 MED ORDER — ACETAMINOPHEN 650 MG RE SUPP: 650.0000 mg | Freq: Four times a day (QID) | RECTAL | Status: DC | PRN

## 2015-10-07 MED ORDER — FENTANYL CITRATE (PF) 100 MCG/2ML IJ SOLN
25.0000 ug | INTRAMUSCULAR | Status: DC | PRN
  Administered 2015-10-07 (×3): 25 ug via INTRAVENOUS

## 2015-10-07 MED ORDER — ONDANSETRON HCL 4 MG/2ML IJ SOLN
4.0000 mg | Freq: Four times a day (QID) | INTRAMUSCULAR | Status: DC | PRN
  Administered 2015-10-08: 4 mg via INTRAVENOUS
  Filled 2015-10-07: qty 2

## 2015-10-07 MED ORDER — EPHEDRINE SULFATE 50 MG/ML IJ SOLN
INTRAMUSCULAR | Status: DC | PRN
  Administered 2015-10-07: 10 mg via INTRAVENOUS

## 2015-10-07 MED ORDER — MIDAZOLAM HCL 2 MG/2ML IJ SOLN
INTRAMUSCULAR | Status: DC | PRN
  Administered 2015-10-07: 2 mg via INTRAVENOUS

## 2015-10-07 MED ORDER — OXYCODONE HCL 5 MG PO TABS
10.0000 mg | ORAL_TABLET | Freq: Four times a day (QID) | ORAL | Status: DC
  Administered 2015-10-07 – 2015-10-10 (×9): 10 mg via ORAL
  Filled 2015-10-07 (×9): qty 2

## 2015-10-07 MED ORDER — METFORMIN HCL 500 MG PO TABS
500.0000 mg | ORAL_TABLET | Freq: Two times a day (BID) | ORAL | Status: DC
  Administered 2015-10-08 – 2015-10-10 (×5): 500 mg via ORAL
  Filled 2015-10-07 (×6): qty 1

## 2015-10-07 MED ORDER — FENTANYL CITRATE (PF) 100 MCG/2ML IJ SOLN
INTRAMUSCULAR | Status: DC | PRN
  Administered 2015-10-07: 100 ug via INTRAVENOUS

## 2015-10-07 MED ORDER — DEXTROSE 5 % IV SOLN
500.0000 mg | Freq: Four times a day (QID) | INTRAVENOUS | Status: DC | PRN
  Filled 2015-10-07: qty 5

## 2015-10-07 MED ORDER — OXYCODONE HCL 5 MG PO TABS
5.0000 mg | ORAL_TABLET | ORAL | Status: DC | PRN
  Administered 2015-10-07 (×2): 5 mg via ORAL
  Administered 2015-10-08 – 2015-10-10 (×5): 10 mg via ORAL
  Filled 2015-10-07: qty 1
  Filled 2015-10-07: qty 2
  Filled 2015-10-07: qty 1
  Filled 2015-10-07 (×3): qty 2

## 2015-10-07 MED ORDER — SODIUM CHLORIDE 0.9 % IV SOLN
INTRAVENOUS | Status: DC | PRN
  Administered 2015-10-07: 60 mL

## 2015-10-07 MED ORDER — SODIUM CHLORIDE 0.9 % IV SOLN
INTRAVENOUS | Status: DC
  Administered 2015-10-07: 13:00:00 via INTRAVENOUS

## 2015-10-07 MED ORDER — HYDROCHLOROTHIAZIDE 25 MG PO TABS
25.0000 mg | ORAL_TABLET | Freq: Every day | ORAL | Status: DC
  Administered 2015-10-08 – 2015-10-10 (×3): 25 mg via ORAL
  Filled 2015-10-07 (×3): qty 1

## 2015-10-07 MED ORDER — STUDY - INVESTIGATIONAL MEDICATION
2.0000 | Status: DC | PRN
  Filled 2015-10-07: qty 1

## 2015-10-07 MED ORDER — PANTOPRAZOLE SODIUM 40 MG PO TBEC
40.0000 mg | DELAYED_RELEASE_TABLET | Freq: Every day | ORAL | Status: DC
  Administered 2015-10-08 – 2015-10-10 (×3): 40 mg via ORAL
  Filled 2015-10-07 (×3): qty 1

## 2015-10-07 MED ORDER — PROPOFOL 10 MG/ML IV BOLUS
INTRAVENOUS | Status: DC | PRN
Start: 2015-10-07 — End: 2015-10-07
  Administered 2015-10-07: 30 mg via INTRAVENOUS

## 2015-10-07 MED ORDER — MOMETASONE FURO-FORMOTEROL FUM 200-5 MCG/ACT IN AERO
2.0000 | INHALATION_SPRAY | Freq: Two times a day (BID) | RESPIRATORY_TRACT | Status: DC
  Administered 2015-10-07 – 2015-10-08 (×2): 2 via RESPIRATORY_TRACT
  Filled 2015-10-07: qty 8.8

## 2015-10-07 MED ORDER — PROPOFOL 500 MG/50ML IV EMUL
INTRAVENOUS | Status: DC | PRN
  Administered 2015-10-07: 125 ug/kg/min via INTRAVENOUS

## 2015-10-07 MED ORDER — PHENYLEPHRINE HCL 10 MG/ML IJ SOLN
INTRAMUSCULAR | Status: DC | PRN
  Administered 2015-10-07 (×3): 100 ug via INTRAVENOUS

## 2015-10-07 MED ORDER — NON FORMULARY: 2.0000 | Freq: Two times a day (BID) | Status: DC

## 2015-10-07 MED ORDER — MORPHINE SULFATE (PF) 2 MG/ML IV SOLN
2.0000 mg | INTRAVENOUS | Status: DC | PRN
  Administered 2015-10-07 – 2015-10-08 (×4): 2 mg via INTRAVENOUS
  Filled 2015-10-07 (×5): qty 1

## 2015-10-07 MED ORDER — BUPIVACAINE-EPINEPHRINE (PF) 0.25% -1:200000 IJ SOLN
INTRAMUSCULAR | Status: AC
  Filled 2015-10-07: qty 30

## 2015-10-07 MED ORDER — TRANEXAMIC ACID 1000 MG/10ML IV SOLN
1500.0000 mg | INTRAVENOUS | Status: DC
  Filled 2015-10-07: qty 15

## 2015-10-07 MED ORDER — ONDANSETRON HCL 4 MG/2ML IJ SOLN
INTRAMUSCULAR | Status: DC | PRN
  Administered 2015-10-07: 4 mg via INTRAVENOUS

## 2015-10-07 MED ORDER — INSULIN ASPART 100 UNIT/ML ~~LOC~~ SOLN
0.0000 [IU] | Freq: Three times a day (TID) | SUBCUTANEOUS | Status: DC
Start: 2015-10-08 — End: 2015-10-10
  Administered 2015-10-08 (×2): 2 [IU] via SUBCUTANEOUS
  Administered 2015-10-09: 3 [IU] via SUBCUTANEOUS
  Administered 2015-10-09 – 2015-10-10 (×3): 2 [IU] via SUBCUTANEOUS
  Administered 2015-10-10: 3 [IU] via SUBCUTANEOUS
  Filled 2015-10-07: qty 3
  Filled 2015-10-07 (×5): qty 2
  Filled 2015-10-07: qty 3

## 2015-10-07 MED ORDER — METHOCARBAMOL 500 MG PO TABS
500.0000 mg | ORAL_TABLET | Freq: Four times a day (QID) | ORAL | Status: DC | PRN
  Administered 2015-10-07: 500 mg via ORAL
  Filled 2015-10-07: qty 1

## 2015-10-07 MED ORDER — ZOLPIDEM TARTRATE 5 MG PO TABS: 10.0000 mg | ORAL_TABLET | Freq: Every day | ORAL | Status: DC

## 2015-10-07 MED ORDER — DOCUSATE SODIUM 100 MG PO CAPS
100.0000 mg | ORAL_CAPSULE | Freq: Two times a day (BID) | ORAL | Status: DC
  Administered 2015-10-07 – 2015-10-10 (×6): 100 mg via ORAL
  Filled 2015-10-07 (×6): qty 1

## 2015-10-07 MED ORDER — BUPIVACAINE LIPOSOME 1.3 % IJ SUSP
INTRAMUSCULAR | Status: AC
Start: 2015-10-07 — End: 2015-10-07
  Filled 2015-10-07: qty 20

## 2015-10-07 MED ORDER — NEOMYCIN-POLYMYXIN B GU 40-200000 IR SOLN
Status: DC | PRN
  Administered 2015-10-07: 14 mL

## 2015-10-07 MED ORDER — SODIUM CHLORIDE 0.9 % IJ SOLN
INTRAMUSCULAR | Status: AC
  Filled 2015-10-07: qty 50

## 2015-10-07 MED ORDER — ONDANSETRON HCL 4 MG/2ML IJ SOLN: 4.0000 mg | Freq: Once | INTRAMUSCULAR | Status: DC | PRN

## 2015-10-07 MED ORDER — VITAMIN D 1000 UNITS PO TABS
2000.0000 [IU] | ORAL_TABLET | Freq: Every day | ORAL | Status: DC
  Administered 2015-10-08 – 2015-10-10 (×3): 2000 [IU] via ORAL
  Filled 2015-10-07 (×3): qty 2

## 2015-10-07 MED ORDER — ROSUVASTATIN CALCIUM 10 MG PO TABS
10.0000 mg | ORAL_TABLET | Freq: Every evening | ORAL | Status: DC
  Administered 2015-10-07 – 2015-10-09 (×3): 10 mg via ORAL
  Filled 2015-10-07 (×3): qty 1

## 2015-10-07 MED ORDER — AMLODIPINE BESYLATE 5 MG PO TABS
5.0000 mg | ORAL_TABLET | Freq: Every day | ORAL | Status: DC
  Administered 2015-10-08 – 2015-10-10 (×3): 5 mg via ORAL
  Filled 2015-10-07 (×3): qty 1

## 2015-10-07 MED ORDER — MORPHINE SULFATE 10 MG/ML IJ SOLN
INTRAMUSCULAR | Status: DC | PRN
  Administered 2015-10-07: 10 mg

## 2015-10-07 MED ORDER — ACETAMINOPHEN 325 MG PO TABS: 650.0000 mg | ORAL_TABLET | Freq: Four times a day (QID) | ORAL | Status: DC | PRN

## 2015-10-07 MED ORDER — METOPROLOL TARTRATE 50 MG PO TABS
50.0000 mg | ORAL_TABLET | Freq: Every day | ORAL | Status: DC
Start: 2015-10-07 — End: 2015-10-10
  Administered 2015-10-08 – 2015-10-10 (×3): 50 mg via ORAL
  Filled 2015-10-07 (×3): qty 1

## 2015-10-07 MED ORDER — LORATADINE 10 MG PO TABS
10.0000 mg | ORAL_TABLET | Freq: Every day | ORAL | Status: DC
  Filled 2015-10-07 (×3): qty 1

## 2015-10-07 MED ORDER — MENTHOL 3 MG MT LOZG
1.0000 | LOZENGE | OROMUCOSAL | Status: DC | PRN
  Filled 2015-10-07: qty 9

## 2015-10-07 MED ORDER — LACTATED RINGERS IV SOLN
INTRAVENOUS | Status: DC | PRN
  Administered 2015-10-07: 14:00:00 via INTRAVENOUS

## 2015-10-07 MED ORDER — TIOTROPIUM BROMIDE MONOHYDRATE 18 MCG IN CAPS
18.0000 ug | ORAL_CAPSULE | Freq: Every day | RESPIRATORY_TRACT | Status: DC
  Filled 2015-10-07: qty 5

## 2015-10-07 MED ORDER — BISACODYL 10 MG RE SUPP
10.0000 mg | Freq: Every day | RECTAL | Status: DC | PRN
  Administered 2015-10-10: 10 mg via RECTAL
  Filled 2015-10-07: qty 1

## 2015-10-07 MED ORDER — ROFLUMILAST 500 MCG PO TABS
500.0000 ug | ORAL_TABLET | Freq: Every day | ORAL | Status: DC
  Administered 2015-10-08 – 2015-10-10 (×3): 500 ug via ORAL
  Filled 2015-10-07 (×3): qty 1

## 2015-10-07 MED ORDER — BUPIVACAINE HCL (PF) 0.5 % IJ SOLN
INTRAMUSCULAR | Status: DC | PRN
  Administered 2015-10-07: 2.7 mL

## 2015-10-07 MED ORDER — METOCLOPRAMIDE HCL 5 MG/ML IJ SOLN
5.0000 mg | Freq: Three times a day (TID) | INTRAMUSCULAR | Status: DC | PRN
Start: 2015-10-07 — End: 2015-10-10

## 2015-10-07 MED ORDER — ENOXAPARIN SODIUM 30 MG/0.3ML ~~LOC~~ SOLN
30.0000 mg | Freq: Two times a day (BID) | SUBCUTANEOUS | Status: DC
  Administered 2015-10-08 – 2015-10-09 (×3): 30 mg via SUBCUTANEOUS
  Filled 2015-10-07 (×3): qty 0.3

## 2015-10-07 MED ORDER — METOCLOPRAMIDE HCL 10 MG PO TABS
5.0000 mg | ORAL_TABLET | Freq: Three times a day (TID) | ORAL | Status: DC | PRN
Start: 2015-10-07 — End: 2015-10-10

## 2015-10-07 MED ORDER — BUPIVACAINE-EPINEPHRINE (PF) 0.25% -1:200000 IJ SOLN
INTRAMUSCULAR | Status: DC | PRN
  Administered 2015-10-07: 30 mL

## 2015-10-07 MED ORDER — CEFAZOLIN SODIUM-DEXTROSE 2-4 GM/100ML-% IV SOLN
2.0000 g | Freq: Four times a day (QID) | INTRAVENOUS | Status: AC
  Administered 2015-10-07 – 2015-10-08 (×3): 2 g via INTRAVENOUS
  Filled 2015-10-07 (×3): qty 100

## 2015-10-07 MED ORDER — ONDANSETRON HCL 4 MG PO TABS: 4.0000 mg | ORAL_TABLET | Freq: Four times a day (QID) | ORAL | Status: DC | PRN

## 2015-10-07 MED ORDER — ZOLPIDEM TARTRATE 5 MG PO TABS
5.0000 mg | ORAL_TABLET | Freq: Every evening | ORAL | Status: DC | PRN
  Administered 2015-10-07 – 2015-10-09 (×3): 5 mg via ORAL
  Filled 2015-10-07 (×3): qty 1

## 2015-10-07 MED ORDER — MAGNESIUM CITRATE PO SOLN
1.0000 | Freq: Once | ORAL | Status: DC | PRN
  Filled 2015-10-07: qty 296

## 2015-10-07 MED ORDER — DIPHENHYDRAMINE HCL 12.5 MG/5ML PO ELIX
12.5000 mg | ORAL_SOLUTION | ORAL | Status: DC | PRN
  Filled 2015-10-07: qty 5

## 2015-10-07 MED ORDER — STUDY - INVESTIGATIONAL MEDICATION
2.0000 | Freq: Two times a day (BID) | Status: DC
  Administered 2015-10-07 – 2015-10-10 (×5): 2 via RESPIRATORY_TRACT
  Filled 2015-10-07: qty 1

## 2015-10-07 MED ORDER — INSULIN ASPART 100 UNIT/ML ~~LOC~~ SOLN: 0.0000 [IU] | Freq: Every day | SUBCUTANEOUS | Status: DC

## 2015-10-07 MED ORDER — KETAMINE HCL 50 MG/ML IJ SOLN
INTRAMUSCULAR | Status: DC | PRN
  Administered 2015-10-07: 50 mg via INTRAMUSCULAR

## 2015-10-07 MED ORDER — ACETAMINOPHEN 10 MG/ML IV SOLN
INTRAVENOUS | Status: DC | PRN
  Administered 2015-10-07: 1000 mg via INTRAVENOUS

## 2015-10-07 MED ORDER — MORPHINE SULFATE (PF) 10 MG/ML IV SOLN
INTRAVENOUS | Status: AC
  Filled 2015-10-07: qty 1

## 2015-10-07 SURGICAL SUPPLY — 60 items
BANDAGE ACE 6X5 VEL STRL LF (GAUZE/BANDAGES/DRESSINGS) ×3 IMPLANT
BLADE SAW 1 (BLADE) ×3 IMPLANT
BLOCK CUTTING TIBIAL 2 LT (MISCELLANEOUS) IMPLANT
CANISTER SUCT 1200ML W/VALVE (MISCELLANEOUS) ×3 IMPLANT
CANISTER SUCT 3000ML (MISCELLANEOUS) ×6 IMPLANT
CAPT KNEE TOTAL 3 ×3 IMPLANT
CATH FOL LEG HOLDER (MISCELLANEOUS) ×3 IMPLANT
CATH TRAY METER 16FR LF (MISCELLANEOUS) ×3 IMPLANT
CEMENT HV SMART SET (Cement) ×6 IMPLANT
CHLORAPREP W/TINT 26ML (MISCELLANEOUS) ×6 IMPLANT
COOLER POLAR GLACIER W/PUMP (MISCELLANEOUS) ×3 IMPLANT
CUFF TOURN 24 STER (MISCELLANEOUS) IMPLANT
CUFF TOURN 30 STER DUAL PORT (MISCELLANEOUS) ×3 IMPLANT
DRAPE INCISE IOBAN 66X45 STRL (DRAPES) ×6 IMPLANT
DRAPE SHEET LG 3/4 BI-LAMINATE (DRAPES) ×6 IMPLANT
ELECT CAUTERY BLADE 6.4 (BLADE) ×3 IMPLANT
ELECT REM PT RETURN 9FT ADLT (ELECTROSURGICAL) ×3
ELECTRODE REM PT RTRN 9FT ADLT (ELECTROSURGICAL) ×1 IMPLANT
FEMUR BONE MODEL 4.9010 MEDACT (Bone Implant) ×3 IMPLANT
GAUZE PETRO XEROFOAM 1X8 (MISCELLANEOUS) ×3 IMPLANT
GAUZE SPONGE 4X4 12PLY STRL (GAUZE/BANDAGES/DRESSINGS) ×3 IMPLANT
GLOVE BIOGEL PI IND STRL 9 (GLOVE) ×1 IMPLANT
GLOVE BIOGEL PI INDICATOR 9 (GLOVE) ×2
GLOVE INDICATOR 8.0 STRL GRN (GLOVE) ×3 IMPLANT
GLOVE SURG ORTHO 8.0 STRL STRW (GLOVE) ×3 IMPLANT
GLOVE SURG ORTHO 9.0 STRL STRW (GLOVE) ×3 IMPLANT
GOWN SRG 2XL LVL 4 RGLN SLV (GOWNS) ×1 IMPLANT
GOWN STRL NON-REIN 2XL LVL4 (GOWNS) ×2
GOWN STRL REUS W/ TWL LRG LVL3 (GOWN DISPOSABLE) ×1 IMPLANT
GOWN STRL REUS W/ TWL XL LVL3 (GOWN DISPOSABLE) ×1 IMPLANT
GOWN STRL REUS W/TWL LRG LVL3 (GOWN DISPOSABLE) ×2
GOWN STRL REUS W/TWL XL LVL3 (GOWN DISPOSABLE) ×2
HANDPIECE INTERPULSE COAX TIP (DISPOSABLE) ×2
HOOD PEEL AWAY FLYTE STAYCOOL (MISCELLANEOUS) ×6 IMPLANT
IMMBOLIZER KNEE 19 BLUE UNIV (SOFTGOODS) ×3 IMPLANT
KIT RM TURNOVER STRD PROC AR (KITS) ×3 IMPLANT
KNEE MEDACTA TIBIAL/FEMORAL BL (Knees) ×3 IMPLANT
KNIFE SCULPS 14X20 (INSTRUMENTS) ×3 IMPLANT
NDL SAFETY 18GX1.5 (NEEDLE) ×3 IMPLANT
NEEDLE SPNL 18GX3.5 QUINCKE PK (NEEDLE) ×3 IMPLANT
NEEDLE SPNL 20GX3.5 QUINCKE YW (NEEDLE) ×3 IMPLANT
NS IRRIG 1000ML POUR BTL (IV SOLUTION) ×3 IMPLANT
PACK TOTAL KNEE (MISCELLANEOUS) ×3 IMPLANT
PAD WRAPON POLAR KNEE (MISCELLANEOUS) ×1 IMPLANT
PREVENA INCISION MGT 90 150 (MISCELLANEOUS) ×3 IMPLANT
SET HNDPC FAN SPRY TIP SCT (DISPOSABLE) ×1 IMPLANT
SOL .9 NS 3000ML IRR  AL (IV SOLUTION) ×2
SOL .9 NS 3000ML IRR UROMATIC (IV SOLUTION) ×1 IMPLANT
STAPLER SKIN PROX 35W (STAPLE) ×3 IMPLANT
SUCTION FRAZIER HANDLE 10FR (MISCELLANEOUS) ×2
SUCTION TUBE FRAZIER 10FR DISP (MISCELLANEOUS) ×1 IMPLANT
SUT DVC 2 QUILL PDO  T11 36X36 (SUTURE) ×2
SUT DVC 2 QUILL PDO T11 36X36 (SUTURE) ×1 IMPLANT
SUT DVC QUILL MONODERM 30X30 (SUTURE) ×3 IMPLANT
SYR 20CC LL (SYRINGE) ×3 IMPLANT
SYR 50ML LL SCALE MARK (SYRINGE) ×6 IMPLANT
TIBIAL BONE MODEL LEFT (MISCELLANEOUS) IMPLANT
TOWEL OR 17X26 4PK STRL BLUE (TOWEL DISPOSABLE) ×3 IMPLANT
TOWER CARTRIDGE SMART MIX (DISPOSABLE) ×3 IMPLANT
WRAPON POLAR PAD KNEE (MISCELLANEOUS) ×3

## 2015-10-07 NOTE — Transfer of Care (Signed)
Immediate Anesthesia Transfer of Care Note  Patient: Patty Leon  Procedure(s) Performed: Procedure(s): TOTAL KNEE ARTHROPLASTY (Left)  Patient Location: PACU  Anesthesia Type:Spinal  Level of Consciousness: awake  Airway & Oxygen Therapy: Patient Spontanous Breathing and Patient connected to face mask oxygen  Post-op Assessment: Report given to RN and Post -op Vital signs reviewed and stable  Post vital signs: Reviewed and stable  Last Vitals:  Vitals:   10/07/15 1232 10/07/15 1709  BP: 110/65 101/63  Pulse: 76 78  Resp: 20 18  Temp: 36.9 C 36.2 C    Last Pain:  Vitals:   10/07/15 1232  TempSrc: Oral  PainSc: 7       Patients Stated Pain Goal: 2 ([id number])  Complications: No apparent anesthesia complications

## 2015-10-07 NOTE — Anesthesia Procedure Notes (Signed)
Spinal  Start time: 10/07/2015 2:40 PM End time: 10/07/2015 2:50 PM Staffing Anesthesiologist: Marline Backbone F Performed: anesthesiologist  Preanesthetic Checklist Completed: patient identified, site marked, surgical consent, pre-op evaluation, timeout performed, IV checked, risks and benefits discussed and monitors and equipment checked Spinal Block Patient position: sitting Prep: Betadine Patient monitoring: heart rate and blood pressure Approach: midline Location: L3-4 Injection technique: single-shot Needle Needle type: Quincke  Needle gauge: 22 G Needle length: 12.7 cm Needle insertion depth: 10 cm Assessment Sensory level: T8

## 2015-10-07 NOTE — H&P (Signed)
Reviewed paper H+P, will be scanned into chart. No changes noted.  

## 2015-10-07 NOTE — Anesthesia Procedure Notes (Signed)
Date/Time: 10/07/2015 4:03 PM Performed by: Nelda Marseille Pre-anesthesia Checklist: Patient identified, Emergency Drugs available, Suction available, Patient being monitored and Timeout performed Oxygen Delivery Method: Simple face mask

## 2015-10-07 NOTE — Anesthesia Preprocedure Evaluation (Signed)
Anesthesia Evaluation  Patient identified by MRN, date of birth, ID band Patient awake    Reviewed: Allergy & Precautions, NPO status , Patient's Chart, lab work & pertinent test results  Airway Mallampati: II       Dental  (+) Edentulous Upper   Pulmonary sleep apnea and Continuous Positive Airway Pressure Ventilation , COPD, Current Smoker,     + decreased breath sounds      Cardiovascular hypertension, Pt. on medications  Rhythm:Regular     Neuro/Psych Anxiety    GI/Hepatic Neg liver ROS, GERD  ,  Endo/Other  diabetes, Type 2, Oral Hypoglycemic AgentsMorbid obesity  Renal/GU negative Renal ROS     Musculoskeletal   Abdominal   Peds  Hematology   Anesthesia Other Findings   Reproductive/Obstetrics                            Anesthesia Physical Anesthesia Plan  ASA: III  Anesthesia Plan: Spinal and General   Post-op Pain Management:    Induction: Intravenous  Airway Management Planned: LMA, Natural Airway and Nasal Cannula  Additional Equipment:   Intra-op Plan:   Post-operative Plan: Extubation in OR  Informed Consent: I have reviewed the patients History and Physical, chart, labs and discussed the procedure including the risks, benefits and alternatives for the proposed anesthesia with the patient or authorized representative who has indicated his/her understanding and acceptance.     Plan Discussed with: CRNA  Anesthesia Plan Comments:         Anesthesia Quick Evaluation

## 2015-10-07 NOTE — Progress Notes (Signed)
Pain meds ordered per Dr. Rudene Christians, pt has chronic pain,  medications adjusted. Pt's inhalers sent to pharmacy, special orders for medication study included. See orders for details.

## 2015-10-07 NOTE — Op Note (Signed)
10/07/2015  5:07 PM  PATIENT:  Patty Leon  55 y.o. female  PRE-OPERATIVE DIAGNOSIS:  LEFT KNEE OSTEOARTHRITIS  POST-OPERATIVE DIAGNOSIS:  LEFT KNEE OSTEOARTHRITIS  PROCEDURE:  Procedure(s): TOTAL KNEE ARTHROPLASTY (Left)  SURGEON: Laurene Footman, MD  ASSISTANTS: Rachelle Hora [facility]  ANESTHESIA:   spinal  EBL:  Total I/O In: 1000 [I.V.:1000] Out: 150 [Urine:100; Blood:50]  BLOOD ADMINISTERED:none  DRAINS: none   LOCAL MEDICATIONS USED:  MARCAINE    and OTHER Exparel and morphine  SPECIMEN:  Source of Specimen:  Cut ends of bone left knee  DISPOSITION OF SPECIMEN:  PATHOLOGY  COUNTS:  YES  TOURNIQUET:   73 minutes at 300 mmHg  IMPLANTS: [place] sphere 2+ femur, 2 tibia with 12 mm insert, size 2 patella all components cemented  DICTATION: .Dragon Dictation patient was brought to the operating room and after adequate anesthesia was obtained the left leg was prepped and draped in usual sterile fashion. After patient identification and timeout procedures were completed, tourniquet was raised. A midline skin incision was made followed by medial parapatellar arthrotomy. Inspection revealed eburnated bone on the femoral condyle and tibial condyle medially and extensive wear of the patella lateral compartment was relatively normal with just mild degenerative change. The anterior cruciate ligament was excised along the fat pad. Proximal tibia was exposed to allow for application the Medacta tibial cutting guide. Proximal tibia cut was carried out menisci excised. The distal femoral cutting guide was applied and the distal femoral cut made followed by application of the 4-in-1 cutting block. Anterior posterior and chamfer cuts were made. Trials were placed with the size 2 tibia and 2+ femur. With proximal tibia preparation and keel punch on the tibia. It was attempted to put a stem but the initial hand reamer did not go deep enough and was presumed to be against the bone as she had a  significant bone of the proximal tibia and no stem was placed. The femur was approached with the femoral trial and then just distal drill holes were made followed by application of the guide for the trochlear groove cut. The trials were removed and the patella was cut using the patellar cutting guide. At this point the bony surfaces were thoroughly irrigated and dried and the local anesthetic listed above was infiltrated throughout the knee. The tibial component was cemented into place first with excess cement removed followed by application of the 12 mm flex insert with set screw tightened with the torque wrench. The femoral component was then cemented into place the knee held in extension as the patellar button was clamped into place. After the cement had set excess cement was removed and the knee was thoroughly irrigated. The wound was then closed with a heavy Quill for the deep capsule,2-0 Quill for the subcutaneous tissue followed by skin staples and a Provena plus wound VAC.  PLAN OF CARE: Admit to inpatient   PATIENT DISPOSITION:  PACU - hemodynamically stable.

## 2015-10-08 ENCOUNTER — Encounter: Payer: Self-pay | Admitting: Orthopedic Surgery

## 2015-10-08 LAB — GLUCOSE, CAPILLARY
GLUCOSE-CAPILLARY: 144 mg/dL — AB (ref 65–99)
Glucose-Capillary: 136 mg/dL — ABNORMAL HIGH (ref 65–99)
Glucose-Capillary: 141 mg/dL — ABNORMAL HIGH (ref 65–99)
Glucose-Capillary: 132 mg/dL — ABNORMAL HIGH (ref 65–99)

## 2015-10-08 LAB — BASIC METABOLIC PANEL
ANION GAP: 6 (ref 5–15)
BUN: 8 mg/dL (ref 6–20)
CHLORIDE: 96 mmol/L — AB (ref 101–111)
CO2: 34 mmol/L — AB (ref 22–32)
Calcium: 9 mg/dL (ref 8.9–10.3)
Creatinine, Ser: 0.48 mg/dL (ref 0.44–1.00)
GFR calc non Af Amer: >60 mL/min (ref >60)
GLUCOSE: 142 mg/dL — AB (ref 65–99)
Potassium: 3.4 mmol/L — ABNORMAL LOW (ref 3.5–5.1)
Sodium: 136 mmol/L (ref 135–145)

## 2015-10-08 LAB — CBC
HEMATOCRIT: 40 % (ref 35.0–47.0)
HEMOGLOBIN: 13.5 g/dL (ref 12.0–16.0)
MCH: 30.2 pg (ref 26.0–34.0)
MCHC: 33.6 g/dL (ref 32.0–36.0)
MCV: 89.8 fL (ref 80.0–100.0)
Platelets: 236 10*3/uL (ref 150–440)
RBC: 4.46 MIL/uL (ref 3.80–5.20)
RDW: 13.7 % (ref 11.5–14.5)
WBC: 8.8 10*3/uL (ref 3.6–11.0)

## 2015-10-08 MED ORDER — OXYCODONE HCL 5 MG PO TABS: 5.0000 mg | ORAL_TABLET | ORAL | 0 refills | Status: DC | PRN

## 2015-10-08 MED ORDER — POTASSIUM CHLORIDE CRYS ER 20 MEQ PO TBCR
20.0000 meq | EXTENDED_RELEASE_TABLET | Freq: Three times a day (TID) | ORAL | Status: AC
  Administered 2015-10-08 (×3): 20 meq via ORAL
  Filled 2015-10-08 (×3): qty 1

## 2015-10-08 MED ORDER — OXYCODONE HCL 10 MG PO TABS: 10.0000 mg | ORAL_TABLET | Freq: Four times a day (QID) | ORAL | 0 refills | Status: DC

## 2015-10-08 MED ORDER — ENOXAPARIN SODIUM 40 MG/0.4ML ~~LOC~~ SOLN: 40.0000 mg | SUBCUTANEOUS | 0 refills | Status: DC

## 2015-10-08 NOTE — Clinical Social Work Note (Signed)
Clinical Social Work Assessment  Patient Details  Name: Patty Leon MRN: [id number] Date of Birth: 10/08/1960  Date of referral:  10/08/15               Reason for consult:  Facility Placement                Permission sought to share information with:  Chartered certified accountant granted to share information::  Yes, Verbal Permission Granted  Name::      Patty Leon::   [place]  Relationship::     Contact Information:     Housing/Transportation Living arrangements for the past 2 months:  Single Family Home Source of Information:  Patient, Other (Comment Required) (Daughter in Sports coach) Patient Interpreter Needed:  None Criminal Activity/Legal Involvement Pertinent to Current Situation/Hospitalization:  No - Comment as needed Significant Relationships:  Adult Children, Other Family Members Lives with:  Self Do you feel safe going back to the place where you live?  Yes Need for family participation in patient care:  Yes (Comment)  Care giving concerns:  Patient lives in [place] alone.    Social Worker assessment / plan:  Holiday representative (CSW) received SNF consult. PT is recommending SNF. Patient is Medicaid only. CSW met with patient to discuss D/C plan. Patient reported that she lives in [place] alone. CSW explained that in order for patient to go to SNF under Medicaid she would have to be willing to stay 30 days, potentially go outside [facility] and give her disability check to the facility for the month that she is there. Patient first stated that she would not be agreeable to staying at a SNF for 30 days. Patient later stated that she would think about it and is agreeable to a SNF search and would consider SNF.   FL2 complete and faxed out. CSW presented 3 bed offers to patient Patty Leon, Patty Leon and Patty Leon in [place]. Patient declined offers and reported that she is going home. RN Case  Manager is aware of above. CSW will continue to follow and assist as needed.   Employment status:  Disabled (Comment on whether or not currently receiving Disability) Insurance information:  Medicaid In [place] PT Recommendations:  [place] / Referral to community resources:  [place]  Patient/Family's Response to care:  Patty Leon declined SNF offers and reported that she will go home.   Patient/Family's Understanding of and Emotional Response to Diagnosis, Current Treatment, and Prognosis:  Patient was pleasant and thanked CSW for visit.   Emotional Assessment Appearance:  Appears stated age Attitude/Demeanor/Rapport:    Affect (typically observed):  Accepting, Adaptable, Pleasant Orientation:  Oriented to Self, Oriented to Place, Oriented to  Time, Oriented to Situation Alcohol / Substance use:  Not Applicable Psych involvement (Current and /or in the community):  No (Comment)  Discharge Needs  Concerns to be addressed:  Discharge Planning Concerns Readmission within the last 30 days:  No Current discharge risk:  Dependent with Mobility, Lives alone, Inadequate Financial Supports Barriers to Discharge:  Inadequate or no insurance, Continued Medical Work up   [employer], [employer], LCSW 10/08/2015, 2:38 PM

## 2015-10-08 NOTE — Care Management Note (Addendum)
Case Management Note  Patient Details  Name: Patty Leon MRN: [id number] Date of Birth: 1960-09-18  Subjective/Objective:     Spoke with patient for discharge plan. Patient is from home alone and has Medicaid only insurance which will limit the amount of PT she may receive at discharge. Patient does not have any DME and will need a rolling walker and a BSC. [facility]. Patient given list of [facility] providers but has not made a decision as to which [facility] provider she would like. Will call Lovenox to her [place]. Patient encouraged to have family or friends come to her home to stay while recovering from surgery. Anticipate discharge home with [facility] tomorrow.                 Action/Plan: Home with Home Health. [facility]  Expected Discharge Date:                  Expected Discharge Plan:  [place]  In-House Referral:  Clinical Social Work  Discharge planning Services  CM Consult  Post Acute Care Choice:  Durable Medical Equipment, Home Health Choice offered to:  Patient  DME Arranged:  [place]ile, 3-N-1 DME Agency:  [place]:  PT [place] Agency:  Other - See comment (No preference yet)  Status of Service:  In process, will continue to follow  If discussed at Long Length of Stay Meetings, dates discussed:    Additional Comments: Lovenox co pay: $ 3 dollars, Patient decided to go t=wioth [place] and referral has been placed.  Alvie Heidelberg, RN 10/08/2015, 1:41 PM

## 2015-10-08 NOTE — Discharge Instructions (Signed)

## 2015-10-08 NOTE — Evaluation (Signed)
Physical Therapy Evaluation Patient Details Name: OCTA SANTIN MRN: [id number] DOB: 1960/10/03 Today's Date: 10/08/2015   History of Present Illness  Pt is 55 year old female s/p L TKA on 10/07/15.    Clinical Impression  Pt having a lot of pain and is very uncomfortable t/o the session.  She does show good effort with exercises (~10 minutes apart from the exam) and ambulation but is pain limited and needs rest breaks and encouragement.  She is unable to do SLR and needs KI and her knee mobility is very limited with only ~40 degrees of flexion.      Follow Up Recommendations SNF    Equipment Recommendations  Rolling walker with 5" wheels    Recommendations for Other Services       Precautions / Restrictions Precautions Precautions: Fall Required Braces or Orthoses: Knee Immobilizer - Left Knee Immobilizer - Left: On at all times Restrictions Weight Bearing Restrictions: Yes LLE Weight Bearing: Weight bearing as tolerated      Mobility  Bed Mobility Overal bed mobility: Needs Assistance Bed Mobility: Supine to Sit     Supine to sit: Min assist     General bed mobility comments: Pt shows great effort getting to EOB, she has a lot of pain and does need some light assist but overall does much of the transition w/o assist  Transfers Overall transfer level: Needs assistance Equipment used: Rolling walker (2 wheeled) Transfers: Sit to/from Stand Sit to Stand: Mod assist         General transfer comment: Pt is slow to rise to standing and very reliant on UEs to shift weight forward.  She has a lot of pain with the effort and lacks confidence  Ambulation/Gait Ambulation/Gait assistance: Min assist Ambulation Distance (Feet): 35 Feet Assistive device: Rolling walker (2 wheeled)       General Gait Details: Pt very slow and guarded with ambulation, but overall was able to maintain balnace and did not need a lot of assist.  Pt did need heavy cuing and encouragement.    Stairs            Wheelchair Mobility    Modified Rankin (Stroke Patients Only)       Balance Overall balance assessment: Needs assistance                                           Pertinent Vitals/Pain Pain Assessment: 0-10 Pain Score: 10-Worst pain ever Pain Location: L knee Pain Descriptors / Indicators: Constant;Sore;Operative site guarding Pain Intervention(s): Limited activity within patient's tolerance;Monitored during session;Premedicated before session;Ice applied;Repositioned    Home Living Family/patient expects to be discharged to:: Skilled nursing facility Living Arrangements: Alone               Additional Comments: she has several family members who can assist:  sisters and sister in laws     Prior Function Level of Independence: Independent         Comments: Pt normally able to do what she needed, recently has been more limited     Hand Dominance   Dominant Hand: Right    Extremity/Trunk Assessment   Upper Extremity Assessment: Overall WFL for tasks assessed           Lower Extremity Assessment: LLE deficits/detail   LLE Deficits / Details: R LE grossly functional, L LE grossly 3-/5, pain with all  acts     Communication   Communication: No difficulties  Cognition Arousal/Alertness: Awake/alert Behavior During Therapy: WFL for tasks assessed/performed Overall Cognitive Status: Within Functional Limits for tasks assessed                      General Comments      Exercises Total Joint Exercises Ankle Circles/Pumps: AROM;10 reps Quad Sets: Strengthening;10 reps Gluteal Sets: Strengthening;10 reps Short Arc Quad: AAROM;10 reps Heel Slides: PROM;5 reps Hip ABduction/ADduction: AROM;10 reps;AAROM Knee Flexion: PROM;5 reps Goniometric ROM: 5-41      Assessment/Plan    PT Assessment Patient needs continued PT services  PT Diagnosis Difficulty walking;Generalized weakness;Acute pain   PT  Problem List Decreased strength;Decreased activity tolerance;Decreased balance;Decreased coordination;Decreased safety awareness;Decreased range of motion;Decreased mobility;Pain  PT Treatment Interventions Gait training;DME instruction;Functional mobility training;Therapeutic activities;Balance training;Therapeutic exercise;Patient/family education   PT Goals (Current goals can be found in the Care Plan section) Acute Rehab PT Goals Patient Stated Goal: hurt less PT Goal Formulation: With patient Time For Goal Achievement: 10/16/15 Potential to Achieve Goals: Fair    Frequency BID   Barriers to discharge        Co-evaluation               End of Session Equipment Utilized During Treatment: Gait belt Activity Tolerance: Patient limited by pain Patient left: with chair alarm set;with call bell/phone within reach           Time: 0902-0931 PT Time Calculation (min) (ACUTE ONLY): 29 min   Charges:   PT Evaluation $PT Eval Low Complexity: 1 Procedure     PT G Codes:        Kreg Shropshire, DPT 10/08/2015, 1:18 PM

## 2015-10-08 NOTE — Discharge Summary (Addendum)
Physician Discharge Summary  Patient ID: Patty Leon MRN: [id number] DOB/AGE: June 08, 1960 55 y.o.  Admit date: 10/07/2015 Discharge date: 10/10/2015 Admission Diagnoses:  LEFT KNEE OSTEOARTHRITIS   Discharge Diagnoses: Patient Active Problem List   Diagnosis Date Noted  . Primary localized osteoarthritis of left knee 10/07/2015    Past Medical History:  Diagnosis Date  . Anxiety   . Arthritis   . Asthma   . COPD (chronic obstructive pulmonary disease) ([place])   . Diabetes mellitus without complication ([place])   . Diverticulosis   . Fluid retention   . GERD (gastroesophageal reflux disease)   . Hypercholesteremia   . Hypertension   . Shortness of breath dyspnea   . Sleep apnea    Use C-PAP with oxygen     Transfusion: None   Consultants (if any):   Discharged Condition: Improved  Hospital Course: Patty Leon is an 55 y.o. female who was admitted 10/07/2015 with a diagnosis of Left knee osteoarthritis and went to the operating room on 10/07/2015 and underwent the above named procedures.    Surgeries: Procedure(s): TOTAL KNEE ARTHROPLASTY on 10/07/2015 Patient tolerated the surgery well. Taken to PACU where she was stabilized and then transferred to the orthopedic floor.  Started on Lovenox 30 q 12 hrs. Foot pumps applied bilaterally at 80 mm. Heels elevated on bed with rolled towels. No evidence of DVT. Negative [place]. Physical therapy started on day #1 for gait training and transfer. OT started day #1 for ADL and assisted devices.  Patient's foley was d/c on day #1. Patient's IV and hemovac was d/c on day #2.  On post op day #4 patient was stable and ready for discharge to SNF.  Implants:Medacta GMK sphere 2+ femur, 2 tibia with 12 mm insert, size 2 patella all components cemented  She was given perioperative antibiotics:  Anti-infectives    Start     Dose/Rate Route Frequency Ordered Stop   10/07/15 1930  ceFAZolin (ANCEF) IVPB 2g/100 mL premix     2 g 200 mL/hr  over 30 Minutes Intravenous Every 6 hours 10/07/15 1831 10/08/15 0853   10/07/15 0500  ceFAZolin (ANCEF) IVPB 2g/100 mL premix  Status:  Discontinued     2 g 200 mL/hr over 30 Minutes Intravenous  Once 10/07/15 0450 10/07/15 1826    .  She was given sequential compression devices, early ambulation, and Lovenox for DVT prophylaxis.  She benefited maximally from the hospital stay and there were no complications.    Recent vital signs:  Vitals:   10/08/15 0326 10/08/15 1203  BP: 123/72 130/74  Pulse: 93 (!) 101  Resp: 18   Temp: 98.6 F (37 C) 98.7 F (37.1 C)    Recent laboratory studies:  Lab Results  Component Value Date   HGB 13.5 10/08/2015   HGB 13.9 10/07/2015   HGB 14.6 10/07/2015   Lab Results  Component Value Date   WBC 8.8 10/08/2015   PLT 236 10/08/2015   Lab Results  Component Value Date   INR 0.86 09/22/2015   Lab Results  Component Value Date   NA 136 10/08/2015   K 3.4 (L) 10/08/2015   CL 96 (L) 10/08/2015   CO2 34 (H) 10/08/2015   BUN 8 10/08/2015   CREATININE 0.48 10/08/2015   GLUCOSE 142 (H) 10/08/2015    Discharge Medications:     Medication List    TAKE these medications   amLODipine 5 MG tablet Commonly known as:  NORVASC Take 5 mg by  mouth daily.   cetirizine 10 MG tablet Commonly known as:  ZYRTEC Take 10 mg by mouth as needed for allergies.   cholecalciferol 1000 units tablet Commonly known as:  VITAMIN D Take 2,000 Units by mouth daily.   CRESTOR 10 MG tablet Generic drug:  rosuvastatin Take 10 mg by mouth every evening.   DALIRESP 500 MCG Tabs tablet Generic drug:  roflumilast Take 500 mcg by mouth daily.   docusate sodium 100 MG capsule Commonly known as:  COLACE Take 100 mg by mouth daily.   enoxaparin 40 MG/0.4ML injection Commonly known as:  LOVENOX Inject 0.4 mLs (40 mg total) into the skin daily.   Fluticasone-Salmeterol 250-50 MCG/DOSE Aepb Commonly known as:  ADVAIR Inhale 1 puff into the lungs 2 (two)  times daily.   hydrochlorothiazide 25 MG tablet Commonly known as:  HYDRODIURIL Take 25 mg by mouth daily.   metFORMIN 500 MG tablet Commonly known as:  GLUCOPHAGE Take 500 mg by mouth 2 (two) times daily.   metoprolol 50 MG tablet Commonly known as:  LOPRESSOR Take 50 mg by mouth daily.   NON FORMULARY Take 2 puffs by mouth 2 (two) times daily. Study medication #1   NON FORMULARY Inhale 2 puffs into the lungs every 4 (four) hours as needed (wheezing or shortness of breath). Study Medication #2   omeprazole 40 MG capsule Commonly known as:  PRILOSEC Take 40 mg by mouth daily as needed.   Oxycodone HCl 10 MG Tabs Take 10 mg by mouth 4 (four) times daily. What changed:  Another medication with the same name was added. Make sure you understand how and when to take each.   oxyCODONE 5 MG immediate release tablet Commonly known as:  Oxy IR/ROXICODONE Take 1-2 tablets (5-10 mg total) by mouth every 3 (three) hours as needed for breakthrough pain. What changed:  You were already taking a medication with the same name, and this prescription was added. Make sure you understand how and when to take each.   Oxycodone HCl 10 MG Tabs Take 1 tablet (10 mg total) by mouth 4 (four) times daily. What changed:  You were already taking a medication with the same name, and this prescription was added. Make sure you understand how and when to take each.   tiotropium 18 MCG inhalation capsule Commonly known as:  SPIRIVA Place 18 mcg into inhaler and inhale daily.   VOLTAREN 1 % Gel Generic drug:  diclofenac sodium Apply 1 application topically 2 (two) times daily as needed. pain   zolpidem 10 MG tablet Commonly known as:  AMBIEN Take 10 mg by mouth at bedtime.       Diagnostic Studies: Dg Knee 1-2 Views Left  Result Date: 10/07/2015 CLINICAL DATA:  Status post total left knee arthroplasty. EXAM: LEFT KNEE - 1-2 VIEW COMPARISON:  02/01/2013 and CT, 09/01/2015. FINDINGS: Left knee  prosthetic components appear well seated well aligned. There is no acute fracture or evidence of an operative complication. IMPRESSION: Well-positioned left knee total arthroplasty. Electronically Signed   By: Lajean Manes M.D.   On: 10/07/2015 17:39  Ct Abdomen Pelvis W Contrast  Result Date: 09/16/2015 CLINICAL DATA:  Upper abdominal pain for 1 month. Nausea. Abnormal liver enzymes. EXAM: CT ABDOMEN AND PELVIS WITH CONTRAST TECHNIQUE: Multidetector CT imaging of the abdomen and pelvis was performed using the standard protocol following bolus administration of intravenous contrast. Oral contrast was also administered. CONTRAST:  136mL ISOVUE-300 IOPAMIDOL (ISOVUE-300) INJECTION 61% COMPARISON:  March 09, 2014  FINDINGS: Lower chest: There is mild anterior left base atelectatic change. Lung bases otherwise are clear. Hepatobiliary: No focal liver lesions are evident. Gallbladder is mildly contracted without appreciable wall thickening. There is no biliary duct dilatation. Pancreas: No pancreatic mass or inflammatory focus. Spleen: No splenic lesion evident. Adrenals/Urinary Tract: Right adrenal appears normal. There is a stable left adrenal adenoma measuring 1.2 x 1.2 cm. Kidneys bilaterally show no mass or hydronephrosis on either side. There is no renal or ureteral calculus on either side. Urinary bladder is midline with wall thickness within normal limits. Stomach/Bowel: There is no appreciable bowel wall or mesenteric thickening. There is no bowel obstruction. No free air or portal venous air. There are occasional sigmoid diverticula without diverticulitis. Vascular/Lymphatic: There are foci of atherosclerotic calcification in the aorta and common iliac arteries. Focal areas of calcification are also noted in both internal iliac arteries. There is no abdominal aortic aneurysm. Major mesenteric vessels appear patent. End there is no adenopathy in the abdomen or pelvis. Reproductive: The uterus is  anteverted. The uterus has an inhomogeneous somewhat lobulated appearance consistent with leiomyomatous change. Largest leiomyoma measures approximately 4.5 x 4.5 x 3.5 cm. Apart from the uterus, there is no pelvic mass or pelvic fluid collection. Other: Appendix appears normal. There is no ascites or abscess in the abdomen or pelvis. There is a small ventral hernia containing only fat. Musculoskeletal: There is degenerative change in the lumbar spine. There are no blastic or lytic bone lesions. There is a benign exostosis arising from the anterior aspect of the upper right sacral ala. There are no intramuscular or abdominal wall lesions evident. IMPRESSION: No demonstrable liver lesions. No bowel wall or mesenteric thickening. No bowel obstruction. No abscess. Appendix appears normal. There are occasional sigmoid diverticula without diverticulitis. No renal or ureteral calculus.  No hydronephrosis. Prominent leiomyomatous uterus.  Small stable left adrenal adenoma. Electronically Signed   By: Lowella Grip III M.D.   On: 09/16/2015 09:15    Disposition: 01-Home or Self Care    Follow-up Information    MENZ,MICHAEL, MD. Call in 2 week(s).   Specialty:  Orthopedic Surgery Why:  For staple removal and Steri-Strip application Contact information: [address] [facility] [place] [address] [phone]            TOTAL KNEE REPLACEMENT POSTOPERATIVE DIRECTIONS  Knee Rehabilitation, Guidelines Following Surgery  Results after knee surgery are often greatly improved when you follow the exercise, range of motion and muscle strengthening exercises prescribed by your doctor. Safety measures are also important to protect the knee from further injury. Any time any of these exercises cause you to have increased pain or swelling in your knee joint, decrease the amount until you are comfortable again and slowly increase them. If you have problems or questions, call your  caregiver or physical therapist for advice.   HOME CARE INSTRUCTIONS  Remove items at home which could result in a fall. This includes throw rugs or furniture in walking pathways.   Continue to use polar care unit on the knee for pain and swelling from surgery. You may notice swelling that will progress down to the foot and ankle.  This is normal after surgery.  Elevate the leg when you are not up walking on it.    Continue to use the breathing machine which will help keep your temperature down.  It is common for your temperature to cycle up and down following surgery, especially at night when you  are not up moving around and exerting yourself.  The breathing machine keeps your lungs expanded and your temperature down.  Do not place pillow under knee, focus on keeping the knee STRAIGHT while resting  DIET You may resume your previous home diet once your are discharged from the hospital.  DRESSING / WOUND CARE / SHOWERING -Please keep wound VAC charged and remove/dispose of wound VAC on 10/14/2015 then apply sterile dressing to the knee. Patient will follow-up in 2 weeks for staple removal and Steri-Strip application to the knee.    ACTIVITY Walk with your walker as instructed. Use walker as long as suggested by your caregivers. Avoid periods of inactivity such as sitting longer than an hour when not asleep. This helps prevent blood clots.  You may resume a sexual relationship in one month or when given the OK by your doctor.  You may return to work once you are cleared by your doctor.  Do not drive a car for 6 weeks or until released by you surgeon.  Do not drive while taking narcotics.  WEIGHT BEARING Weight bearing as tolerated with assist device (walker, cane, etc) as directed, use it as long as suggested by your surgeon or therapist, typically at least 4-6 weeks.  POSTOPERATIVE CONSTIPATION PROTOCOL Constipation - defined medically as fewer than three stools per week and severe  constipation as less than one stool per week.  One of the most common issues patients have following surgery is constipation.  Even if you have a regular bowel pattern at home, your normal regimen is likely to be disrupted due to multiple reasons following surgery.  Combination of anesthesia, postoperative narcotics, change in appetite and fluid intake all can affect your bowels.  In order to avoid complications following surgery, here are some recommendations in order to help you during your recovery period.  Colace (docusate) - Pick up an over-the-counter form of Colace or another stool softener and take twice a day as long as you are requiring postoperative pain medications.  Take with a full glass of water daily.  If you experience loose stools or diarrhea, hold the colace until you stool forms back up.  If your symptoms do not get better within 1 week or if they get worse, check with your doctor.  Dulcolax (bisacodyl) - Pick up over-the-counter and take as directed by the product packaging as needed to assist with the movement of your bowels.  Take with a full glass of water.  Use this product as needed if not relieved by Colace only.   MiraLax (polyethylene glycol) - Pick up over-the-counter to have on hand.  MiraLax is a solution that will increase the amount of water in your bowels to assist with bowel movements.  Take as directed and can mix with a glass of water, juice, soda, coffee, or tea.  Take if you go more than two days without a movement. Do not use MiraLax more than once per day. Call your doctor if you are still constipated or irregular after using this medication for 7 days in a row.  If you continue to have problems with postoperative constipation, please contact the office for further assistance and recommendations.  If you experience "the worst abdominal pain ever" or develop nausea or vomiting, please contact the office immediatly for further recommendations for  treatment.  ITCHING  If you experience itching with your medications, try taking only a single pain pill, or even half a pain pill at a time.  You can  also use Benadryl over the counter for itching or also to help with sleep.   TED HOSE STOCKINGS Wear the elastic stockings on both legs for six weeks following surgery during the day but you may remove then at night for sleeping.  MEDICATIONS See your medication summary on the "After Visit Summary" that the nursing staff will review with you prior to discharge.  You may have some home medications which will be placed on hold until you complete the course of blood thinner medication.  It is important for you to complete the blood thinner medication as prescribed by your surgeon.  Continue your approved medications as instructed at time of discharge.  PRECAUTIONS If you experience chest pain or shortness of breath - call 911 immediately for transfer to the hospital emergency department.  If you develop a fever greater that 101 F, purulent drainage from wound, increased redness or drainage from wound, foul odor from the wound/dressing, or calf pain - CONTACT YOUR SURGEON.                                                   FOLLOW-UP APPOINTMENTS Make sure you keep all of your appointments after your operation with your surgeon and caregivers. You should call the office at the above phone number and make an appointment for approximately two weeks after the date of your surgery or on the date instructed by your surgeon outlined in the "After Visit Summary".   RANGE OF MOTION AND STRENGTHENING EXERCISES  Rehabilitation of the knee is important following a knee injury or an operation. After just a few days of immobilization, the muscles of the thigh which control the knee become weakened and shrink (atrophy). Knee exercises are designed to build up the tone and strength of the thigh muscles and to improve knee motion. Often times heat used for twenty to  thirty minutes before working out will loosen up your tissues and help with improving the range of motion but do not use heat for the first two weeks following surgery. These exercises can be done on a training (exercise) mat, on the floor, on a table or on a bed. Use what ever works the best and is most comfortable for you Knee exercises include:  Leg Lifts - While your knee is still immobilized in a splint or cast, you can do straight leg raises. Lift the leg to 60 degrees, hold for 3 sec, and slowly lower the leg. Repeat 10-20 times 2-3 times daily. Perform this exercise against resistance later as your knee gets better.  Quad and Hamstring Sets - Tighten up the muscle on the front of the thigh (Quad) and hold for 5-10 sec. Repeat this 10-20 times hourly. Hamstring sets are done by pushing the foot backward against an object and holding for 5-10 sec. Repeat as with quad sets.   Leg Slides: Lying on your back, slowly slide your foot toward your buttocks, bending your knee up off the floor (only go as far as is comfortable). Then slowly slide your foot back down until your leg is flat on the floor again.  Angel Wings: Lying on your back spread your legs to the side as far apart as you can without causing discomfort.  A rehabilitation program following serious knee injuries can speed recovery and prevent re-injury in the future due to weakened  muscles. Contact your doctor or a physical therapist for more information on knee rehabilitation.   IF YOU ARE TRANSFERRED TO A SKILLED REHAB FACILITY If the patient is transferred to a skilled rehab facility following release from the hospital, a list of the current medications will be sent to the facility for the patient to continue.  When discharged from the skilled rehab facility, please have the facility set up the patient's [place] prior to being released. Also, the skilled facility will be responsible for providing the patient with their  medications at time of release from the facility to include their pain medication, the muscle relaxants, and their blood thinner medication. If the patient is still at the rehab facility at time of the two week follow up appointment, the skilled rehab facility will also need to assist the patient in arranging follow up appointment in our office and any transportation needs.  MAKE SURE YOU:  Understand these instructions.  Get help right away if you are not doing well or get worse.         Signed: Dorise Hiss CHRISTOPHER 10/08/2015, 12:36 PM

## 2015-10-08 NOTE — Progress Notes (Signed)
Physical Therapy Treatment Patient Details Name: Patty Leon MRN: [id number] DOB: 01-14-1961 Today's Date: 10/08/2015    History of Present Illness Pt is 55 year old female s/p L TKA on 10/07/15.      PT Comments    Pt shows increased confidence with ambulation this afternoon, but remains guarded and slow. She was very fatigue with the effort though O2 remains in the high 90s (HR increases to 120s).  She c/o pain t/o the entire session but was able to increase strength, ROM and gait.  Pt still very stiff with knee ROM and has a lot of pain.  Follow Up Recommendations  SNF     Equipment Recommendations  Rolling walker with 5" wheels    Recommendations for Other Services       Precautions / Restrictions Precautions Precautions: Fall Required Braces or Orthoses: Knee Immobilizer - Left Restrictions LLE Weight Bearing: Weight bearing as tolerated    Mobility  Bed Mobility Overal bed mobility: Needs Assistance Bed Mobility: Sit to Supine     Supine to sit: Min assist Sit to supine: Min assist   General bed mobility comments: Pt is able to use UEs to get L LE into bed with only light assist from PT  Transfers Overall transfer level: Needs assistance Equipment used: Rolling walker (2 wheeled) Transfers: Sit to/from Stand Sit to Stand: Mod assist;Min assist         General transfer comment: Pt shows some increased independence with rising to standing but still needs heavy cuing and some light assist.  Ambulation/Gait Ambulation/Gait assistance: Min guard;Min assist Ambulation Distance (Feet): 70 Feet Assistive device: Rolling walker (2 wheeled)       General Gait Details: Pt remains slow and guarded with ambulation but ultimately was able to increase distance, cadence and overall had increased confidence.  Pt very fatigued with the effort.    Stairs            Wheelchair Mobility    Modified Rankin (Stroke Patients Only)       Balance Overall  balance assessment: Needs assistance                                  Cognition Arousal/Alertness: Awake/alert Behavior During Therapy: WFL for tasks assessed/performed Overall Cognitive Status: Within Functional Limits for tasks assessed                      Exercises Total Joint Exercises Ankle Circles/Pumps: AROM;10 reps Quad Sets: Strengthening;10 reps Gluteal Sets: Strengthening;10 reps Short Arc Quad: AAROM;10 reps Heel Slides: PROM;5 reps Hip ABduction/ADduction: AROM;10 reps Straight Leg Raises: AAROM;5 reps Knee Flexion: PROM;5 reps Goniometric ROM: 5-41    General Comments        Pertinent Vitals/Pain Pain Assessment: 0-10 Pain Score: 10-Worst pain ever Pain Location: L knee Pain Descriptors / Indicators: Constant;Sore;Operative site guarding Pain Intervention(s): Limited activity within patient's tolerance;Monitored during session;Premedicated before session;Ice applied;Repositioned    Home Living Family/patient expects to be discharged to:: Skilled nursing facility Living Arrangements: Alone             Additional Comments: she has several family members who can assist:  sisters and sister in laws     Prior Function Level of Independence: Independent      Comments: Pt normally able to do what she needed, recently has been more limited   PT Goals (current goals can now be  found in the care plan section) Acute Rehab PT Goals Patient Stated Goal: hurt less PT Goal Formulation: With patient Time For Goal Achievement: 10/16/15 Potential to Achieve Goals: Fair Progress towards PT goals: Progressing toward goals    Frequency  BID    PT Plan Current plan remains appropriate    Co-evaluation             End of Session Equipment Utilized During Treatment: Gait belt Activity Tolerance: Patient limited by pain Patient left: with bed alarm set;with call bell/phone within reach     Time: 1415-1455 PT Time Calculation  (min) (ACUTE ONLY): 40 min  Charges:  $Gait Training: 8-22 mins $Therapeutic Exercise: 23-37 mins                    G Codes:      Kreg Shropshire, DPT 10/08/2015, 3:51 PM

## 2015-10-08 NOTE — Evaluation (Signed)
Occupational Therapy Evaluation Patient Details Name: ICLE MCCLARD MRN: [id number] DOB: 17-Aug-1960 Today's Date: 10/08/2015    History of Present Illness Pt is 55 year old female s/p L TKA on 10/07/15 and is POD 1.   Clinical Impression   Pt is 55 year old female s/p L TKA with L immobilizer in place.  Pt was independent in all ADLs prior to surgery (sponge bath only) and is eager to return to PLOF.  Pt currently requires moderate assist for LB dressing on R and max on LLE while in seated position due to pain (10/10) and limited AROM of L knee.  Pt would benefit from instruction in dressing techniques with or without assistive devices for dressing and bathing skills.  Pt would also benefit from recommendations for home modifications to increase safety in the bathroom and prevent falls. Rec SNF after discharge to continue rehab in order to return to PLOF.    Follow Up Recommendations  SNF    Equipment Recommendations  Tub/shower bench    Recommendations for Other Services       Precautions / Restrictions Precautions Precautions: Fall Required Braces or Orthoses: Knee Immobilizer - Left Knee Immobilizer - Left: On at all times Restrictions Weight Bearing Restrictions: Yes LLE Weight Bearing: Weight bearing as tolerated      Mobility Bed Mobility                  Transfers                      Balance                                            ADL Overall ADL's : Needs assistance/impaired                                       General ADL Comments: Pt was sitting up in chair and very uncomfortable sitting in chair and asked to re-position and needed mod cues and assist to scoot back into recliner with extra time due to pain 10/10.  She requires mod assist for LB dressing using reacher and sock aid for RLE and total assist for LLE.  Indep after set up for grooming, hygiene and UB bathing and dressing.  She was only sponge  bathing before surgery since knee pain was so bad and she could not get LLE over tub.  Reviewed and rec a transfer tub bench after she regains AROM  to prevent falls.  Gave her AD catalog with rec.      Vision     Perception     Praxis      Pertinent Vitals/Pain Pain Assessment: 0-10 Pain Score: 8  Pain Location: L knee Pain Descriptors / Indicators: Constant;Sore;Operative site guarding Pain Intervention(s): Limited activity within patient's tolerance;Monitored during session;Premedicated before session;Ice applied;Repositioned     Hand Dominance Right   Extremity/Trunk Assessment Upper Extremity Assessment Upper Extremity Assessment: Overall WFL for tasks assessed   Lower Extremity Assessment Lower Extremity Assessment: Defer to PT evaluation       Communication Communication Communication: No difficulties   Cognition Arousal/Alertness: Awake/alert Behavior During Therapy: WFL for tasks assessed/performed Overall Cognitive Status: Within Functional Limits for tasks assessed  General Comments       Exercises       Shoulder Instructions      Home Living Family/patient expects to be discharged to:: Skilled nursing facility Living Arrangements: Other relatives;Spouse/significant other                               Additional Comments: she has several family members who can assist:  sisters and sister in laws       Prior Functioning/Environment Level of Independence: Independent        Comments: pt was not showering due to severe knee pain and was only sponge bathing. She has a ramped entrance into her home.  She does not appear to be on good terms with her husband and when initially asked, she said she lived alone.      OT Diagnosis: Acute pain;Generalized weakness   OT Problem List: Decreased strength;Decreased range of motion;Decreased activity tolerance;Decreased knowledge of use of DME or AE;Pain   OT  Treatment/Interventions: Self-care/ADL training;DME and/or AE instruction;Patient/family education;Therapeutic activities    OT Goals(Current goals can be found in the care plan section) Acute Rehab OT Goals Patient Stated Goal: "to get comfortable and get pain under control" OT Goal Formulation: With patient/family Time For Goal Achievement: 10/22/15 Potential to Achieve Goals: Good ADL Goals Pt Will Perform Lower Body Dressing: with supervision;with adaptive equipment;sit to/from stand (using FWW with L knee immob, with AD and no LOB) Pt Will Transfer to Toilet: with min assist;stand pivot transfer;bedside commode (BSC over toilet) Pt Will Perform Toileting - Clothing Manipulation and hygiene: with min assist;sit to/from stand  OT Frequency: Min 1X/week   Barriers to D/C:            Co-evaluation              End of Session    Activity Tolerance: Patient limited by pain Patient left: in chair;with call bell/phone within reach;with family/visitor present;with chair alarm set   Time: [id number] OT Time Calculation (min): 35 min Charges:  OT General Charges $OT Visit: 1 Procedure OT Evaluation $OT Eval Moderate Complexity: 1 Procedure OT Treatments $Self Care/Home Management : 8-22 mins G-Codes:     Chrys Racer, OTR/L ascom [phone]  10/08/2015, 12:01 PM

## 2015-10-08 NOTE — NC FL2 (Signed)
[place] LEVEL OF CARE SCREENING TOOL     IDENTIFICATION  Patient Name: Patty Leon Birthdate: 08-27-1960 Sex: female Admission Date (Current Location): 10/07/2015  [facility] and [place] Number:  [name]  ([id number]) Facility and Address:  [facility], [address] [place], [place] [address]      Provider Number: [id number]  Attending Physician Name and Address:  Hessie Knows, MD  Relative Name and Phone Number:       Current Level of Care: Hospital Recommended Level of Care: [place] Prior Approval Number:    Date Approved/Denied:   PASRR Number:  ([id number])  Discharge Plan: SNF    Current Diagnoses: Patient Active Problem List   Diagnosis Date Noted  . Primary localized osteoarthritis of left knee 10/07/2015   Chronic airway obstruction, not elsewhere classified , unspecified (CMS-HCC)    Chronic obstructive asthma, unspecified (CMS-HCC)    Hypersomnia with sleep apnea, unspecified    Obesity, unspecified    Hypertension  Admitted to [facility] 07/04/2008 with headaches and shortness of breath. Was noted to be in hypertensive urgency and was started on IV lobetalol.   Tubular adenoma of colon, unspecified 01/22/2015   Hyperplastic colon polyp 01/22/2015   Diverticulosis       Orientation RESPIRATION BLADDER Height & Weight     Self, Time, Situation, Place  O2 (3 Liters Oxygen ) Continent Weight: 265 lb (120.2 kg) Height:  5\' 2"  (157.5 cm)  BEHAVIORAL SYMPTOMS/MOOD NEUROLOGICAL BOWEL NUTRITION STATUS   (none )  (none ) Continent Diet (Diet: Carb Modified )  AMBULATORY STATUS COMMUNICATION OF NEEDS Skin   Extensive Assist Verbally Surgical wounds, Wound Vac (Incision: Left Knee (Provena Wound Vac) )                       Personal Care Assistance Level of Assistance  Bathing, Feeding, Dressing Bathing Assistance: Limited assistance Feeding assistance:  Independent Dressing Assistance: Limited assistance     Functional Limitations Info  Sight, Hearing, Speech Sight Info: Adequate Hearing Info: Adequate Speech Info: Adequate    SPECIAL CARE FACTORS FREQUENCY  PT (By licensed PT)     PT Frequency:  (2-3)              Contractures      Additional Factors Info  Code Status, Allergies, Insulin Sliding Scale Code Status Info:  (Full Code. ) Allergies Info:  (Celebrex Celecoxib)   Insulin Sliding Scale Info:  (NovoLog Insulin Injections at bedtime. )       Current Medications (10/08/2015):  This is the current hospital active medication list Current Facility-Administered Medications  Medication Dose Route Frequency Provider Last Rate Last Dose  . 0.9 %  sodium chloride infusion   Intravenous Continuous Hessie Knows, MD 75 mL/hr at 10/07/15 1846    . acetaminophen (TYLENOL) tablet 650 mg  650 mg Oral Q6H PRN Hessie Knows, MD       Or  . acetaminophen (TYLENOL) suppository 650 mg  650 mg Rectal Q6H PRN Hessie Knows, MD      . amLODipine (NORVASC) tablet 5 mg  5 mg Oral Daily Hessie Knows, MD   5 mg at 10/08/15 1211  . bisacodyl (DULCOLAX) suppository 10 mg  10 mg Rectal Daily PRN Hessie Knows, MD      . cholecalciferol (VITAMIN D) tablet 2,000 Units  2,000 Units Oral Daily Hessie Knows, MD   2,000 Units at 10/08/15 1211  . diphenhydrAMINE (BENADRYL)  12.5 MG/5ML elixir 12.5-25 mg  12.5-25 mg Oral Q4H PRN Hessie Knows, MD      . docusate sodium (COLACE) capsule 100 mg  100 mg Oral BID Hessie Knows, MD   100 mg at 10/08/15 1210  . enoxaparin (LOVENOX) injection 30 mg  30 mg Subcutaneous Q12H Hessie Knows, MD   30 mg at 10/08/15 0829  . hydrochlorothiazide (HYDRODIURIL) tablet 25 mg  25 mg Oral Daily Hessie Knows, MD   25 mg at 10/08/15 1211  . insulin aspart (novoLOG) injection 0-15 Units  0-15 Units Subcutaneous TID WC Hessie Knows, MD   2 Units at 10/08/15 0830  . insulin aspart (novoLOG) injection 0-5 Units  0-5 Units  Subcutaneous QHS Hessie Knows, MD      . Investigational - Study Medication #1 Dema Severin Inhaler)  2 puff Inhalation BID Darylene Price Rudy, [facility]   2 puff at 10/07/15 2147  . Investigational - Study Medication #2 (Blue Inhaler)  2 puff Inhalation Q4H PRN Lenis Noon, RPH      . loratadine (CLARITIN) tablet 10 mg  10 mg Oral Daily Hessie Knows, MD      . magnesium citrate solution 1 Bottle  1 Bottle Oral Once PRN Hessie Knows, MD      . magnesium hydroxide (MILK OF MAGNESIA) suspension 30 mL  30 mL Oral Daily PRN Hessie Knows, MD      . menthol-cetylpyridinium (CEPACOL) lozenge 3 mg  1 lozenge Oral PRN Hessie Knows, MD       Or  . phenol (CHLORASEPTIC) mouth spray 1 spray  1 spray Mouth/Throat PRN Hessie Knows, MD      . metFORMIN (GLUCOPHAGE) tablet 500 mg  500 mg Oral BID WC Hessie Knows, MD   500 mg at 10/08/15 0830  . methocarbamol (ROBAXIN) tablet 500 mg  500 mg Oral Q6H PRN Hessie Knows, MD   500 mg at 10/07/15 2145   Or  . methocarbamol (ROBAXIN) 500 mg in dextrose 5 % 50 mL IVPB  500 mg Intravenous Q6H PRN Hessie Knows, MD      . metoCLOPramide (REGLAN) tablet 5-10 mg  5-10 mg Oral Q8H PRN Hessie Knows, MD       Or  . metoCLOPramide (REGLAN) injection 5-10 mg  5-10 mg Intravenous Q8H PRN Hessie Knows, MD      . metoprolol (LOPRESSOR) tablet 50 mg  50 mg Oral Daily Hessie Knows, MD   50 mg at 10/08/15 1218  . mometasone-formoterol (DULERA) 200-5 MCG/ACT inhaler 2 puff  2 puff Inhalation BID Hessie Knows, MD   2 puff at 10/07/15 2146  . morphine 2 MG/ML injection 2 mg  2 mg Intravenous Q1H PRN Hessie Knows, MD   2 mg at 10/08/15 1208  . ondansetron (ZOFRAN) tablet 4 mg  4 mg Oral Q6H PRN Hessie Knows, MD       Or  . ondansetron [facility]) injection 4 mg  4 mg Intravenous Q6H PRN Hessie Knows, MD   4 mg at 10/08/15 [id number]  . oxyCODONE (Oxy IR/ROXICODONE) immediate release tablet 10 mg  10 mg Oral QID Hessie Knows, MD   10 mg at 10/07/15 2146  . oxyCODONE (Oxy IR/ROXICODONE) immediate release tablet  5-10 mg  5-10 mg Oral Q3H PRN Hessie Knows, MD   10 mg at 10/08/15 0830  . pantoprazole (PROTONIX) EC tablet 40 mg  40 mg Oral Daily Hessie Knows, MD   40 mg at 10/08/15 1211  . potassium chloride SA (K-DUR,KLOR-CON) CR tablet 20  mEq  20 mEq Oral TID Duanne Guess, PA-C   20 mEq at 10/08/15 1210  . roflumilast (DALIRESP) tablet 500 mcg  500 mcg Oral Daily Hessie Knows, MD   500 mcg at 10/08/15 1211  . rosuvastatin (CRESTOR) tablet 10 mg  10 mg Oral QPM Hessie Knows, MD   10 mg at 10/07/15 2145  . tiotropium (SPIRIVA) inhalation capsule 18 mcg  18 mcg Inhalation Daily Hessie Knows, MD      . zolpidem [facility]) tablet 5 mg  5 mg Oral QHS PRN Hessie Knows, MD   5 mg at 10/07/15 2145     Discharge Medications: Please see discharge summary for a list of discharge medications.  Relevant Imaging Results:  Relevant Lab Results:   Additional Information  (SSN: [id number])  Ladashia Demarinis, Veronia Beets, LCSW

## 2015-10-08 NOTE — Clinical Social Work Placement (Signed)
   CLINICAL SOCIAL WORK PLACEMENT  NOTE  Date:  10/08/2015  Patient Details  Name: Patty Leon MRN: [id number] Date of Birth: 04-01-1960  Clinical Social Work is seeking post-discharge placement for this patient at the [place] level of care (*CSW will initial, date and re-position this form in  chart as items are completed):  Yes   Patient/family provided with [place] Work Department's list of facilities offering this level of care within the geographic area requested by the patient (or if unable, by the patient's family).  Yes   Patient/family informed of their freedom to choose among providers that offer the needed level of care, that participate in Medicare, Medicaid or managed care program needed by the patient, have an available bed and are willing to accept the patient.  Yes   Patient/family informed of [facility]'s ownership interest in [facility] and [facility], as well as of the fact that they are under no obligation to receive care at these facilities.  PASRR submitted to EDS on 10/08/15     PASRR number received on 10/08/15     Existing PASRR number confirmed on       FL2 transmitted to all facilities in geographic area requested by pt/family on 10/08/15     FL2 transmitted to all facilities within larger geographic area on       Patient informed that his/her managed care company has contracts with or will negotiate with certain facilities, including the following:            Patient/family informed of bed offers received.  Patient chooses bed at       Physician recommends and patient chooses bed at      Patient to be transferred to   on  .  Patient to be transferred to facility by       Patient family notified on   of transfer.  Name of family member notified:        PHYSICIAN       Additional Comment:    _______________________________________________ Audi Conover, Veronia Beets, LCSW 10/08/2015, 2:37 PM

## 2015-10-08 NOTE — Progress Notes (Signed)
Pt refusing to wear bonefoam at this time.

## 2015-10-08 NOTE — Progress Notes (Signed)
   Subjective: 1 Day Post-Op Procedure(s) (LRB): TOTAL KNEE ARTHROPLASTY (Left) Patient reports pain as severe.   Patient is well, and has had no acute complaints or problems. Patient states pain is severe, but she appears to be resting comfortably. Denies any CP, SOB, ABD pain. We will continue therapy today.  Plan is to go Skilled nursing facility after hospital stay.  Objective: Vital signs in last 24 hours: Temp:  [97.1 F (36.2 C)-98.8 F (37.1 C)] 98.6 F (37 C) (07/28 0326) Pulse Rate:  [63-93] 93 (07/28 0326) Resp:  [14-20] 18 (07/28 0326) BP: (101-138)/(63-92) 123/72 (07/28 0326) SpO2:  [90 %-100 %] 90 % (07/28 0326) Weight:  [120.2 kg (265 lb)] 120.2 kg (265 lb) (07/27 1232)  Intake/Output from previous day: 07/27 0701 - 07/28 0700 In: 3300 [I.V.:1500] Out: 1450 [Urine:1400; Blood:50] Intake/Output this shift: No intake/output data recorded.   Recent Labs  10/07/15 1232 10/07/15 1931 10/08/15 0552  HGB 14.6 13.9 13.5    Recent Labs  10/07/15 1931 10/08/15 0552  WBC 7.1 8.8  RBC 4.65 4.46  HCT 42.5 40.0  PLT 246 236    Recent Labs  10/07/15 1232 10/07/15 1931 10/08/15 0552  NA 141  --  136  K 3.4*  --  3.4*  CL  --   --  96*  CO2  --   --  34*  BUN  --   --  8  CREATININE  --  0.47 0.48  GLUCOSE 116*  --  142*  CALCIUM  --   --  9.0   No results for input(s): LABPT, INR in the last 72 hours.  EXAM General - Patient is Alert, Appropriate and Oriented Extremity - Neurovascular intact Sensation intact distally Intact pulses distally Dorsiflexion/Plantar flexion intact No cellulitis present Dressing - dressing C/D/I, no drainage and wound vac intact Motor Function - intact, moving foot and toes well on exam.   Past Medical History:  Diagnosis Date  . Anxiety   . Arthritis   . Asthma   . COPD (chronic obstructive pulmonary disease) ([place])   . Diabetes mellitus without complication ([place])   . Diverticulosis   . Fluid retention   .  GERD (gastroesophageal reflux disease)   . Hypercholesteremia   . Hypertension   . Shortness of breath dyspnea   . Sleep apnea    Use C-PAP with oxygen    Assessment/Plan:   1 Day Post-Op Procedure(s) (LRB): TOTAL KNEE ARTHROPLASTY (Left) Active Problems:   Primary localized osteoarthritis of left knee   Hypokalemia 3.4   Estimated body mass index is 48.47 kg/m as calculated from the following:   Height as of this encounter: 5\' 2"  (1.575 m).   Weight as of this encounter: 120.2 kg (265 lb). Advance diet Up with therapy  Needs BM Klor kon 20 meq TID, recheck labs in the am CM to assist with discharge Wound vac will need to be removed and disposed of on 10/14/15. Apply Sterile dressing to knee. Follow up with [facility] in 2 weeks for staple removal.   DVT Prophylaxis - Lovenox, Foot Pumps and TED hose Weight-Bearing as tolerated to left leg   T. Rachelle Hora, PA-C [place] 10/08/2015, 8:13 AM

## 2015-10-09 LAB — GLUCOSE, CAPILLARY
Glucose-Capillary: 122 mg/dL — ABNORMAL HIGH (ref 65–99)
Glucose-Capillary: 151 mg/dL — ABNORMAL HIGH (ref 65–99)
Glucose-Capillary: 124 mg/dL — ABNORMAL HIGH (ref 65–99)
Glucose-Capillary: 138 mg/dL — ABNORMAL HIGH (ref 65–99)

## 2015-10-09 LAB — CBC
HCT: 40.2 % (ref 35.0–47.0)
Hemoglobin: 13.6 g/dL (ref 12.0–16.0)
MCH: 30.1 pg (ref 26.0–34.0)
MCHC: 34 g/dL (ref 32.0–36.0)
MCV: 88.8 fL (ref 80.0–100.0)
PLATELETS: 224 10*3/uL (ref 150–440)
RBC: 4.52 MIL/uL (ref 3.80–5.20)
RDW: 14 % (ref 11.5–14.5)
WBC: 10.1 10*3/uL (ref 3.6–11.0)

## 2015-10-09 LAB — BASIC METABOLIC PANEL
ANION GAP: 9 (ref 5–15)
BUN: <5 mg/dL — ABNORMAL LOW (ref 6–20)
CALCIUM: 9.3 mg/dL (ref 8.9–10.3)
CO2: 35 mmol/L — ABNORMAL HIGH (ref 22–32)
Chloride: 88 mmol/L — ABNORMAL LOW (ref 101–111)
Creatinine, Ser: 0.39 mg/dL — ABNORMAL LOW (ref 0.44–1.00)
GFR calc Af Amer: >60 mL/min (ref >60)
GLUCOSE: 127 mg/dL — AB (ref 65–99)
POTASSIUM: 3.6 mmol/L (ref 3.5–5.1)
Sodium: 132 mmol/L — ABNORMAL LOW (ref 135–145)

## 2015-10-09 MED ORDER — LACTULOSE 10 GM/15ML PO SOLN
10.0000 g | Freq: Two times a day (BID) | ORAL | Status: DC | PRN
Start: 2015-10-09 — End: 2015-10-10
  Administered 2015-10-10: 10 g via ORAL
  Filled 2015-10-09: qty 30

## 2015-10-09 MED ORDER — CETYLPYRIDINIUM CHLORIDE 0.05 % MT LIQD
7.0000 mL | Freq: Two times a day (BID) | OROMUCOSAL | Status: DC
  Administered 2015-10-09 – 2015-10-10 (×3): 7 mL via OROMUCOSAL

## 2015-10-09 MED ORDER — CHLORHEXIDINE GLUCONATE 0.12 % MT SOLN
15.0000 mL | Freq: Two times a day (BID) | OROMUCOSAL | Status: DC
  Administered 2015-10-09 – 2015-10-10 (×3): 15 mL via OROMUCOSAL
  Filled 2015-10-09 (×3): qty 15

## 2015-10-09 MED ORDER — ENOXAPARIN SODIUM 40 MG/0.4ML ~~LOC~~ SOLN
40.0000 mg | Freq: Two times a day (BID) | SUBCUTANEOUS | Status: DC
  Administered 2015-10-09 – 2015-10-10 (×2): 40 mg via SUBCUTANEOUS
  Filled 2015-10-09 (×2): qty 0.4

## 2015-10-09 NOTE — Progress Notes (Signed)
Physical Therapy Treatment Patient Details Name: Patty Leon MRN: [id number] DOB: [birth year] Today's Date: 10/09/2015    History of Present Illness Pt is 55 year old female s/p L TKA on 10/07/15.      PT Comments    Pt is able to increase ambulation distance but is still hesitant and guarded with the effort. She is still unable to do SLRs (needing KI during ambulation) and remains very stiff and painful with ROM acts.  Pt is willing to participate but does c/o significant pain with most acts.   Follow Up Recommendations  Home health PT (per progress)     Equipment Recommendations       Recommendations for Other Services       Precautions / Restrictions Precautions Precautions: Fall Required Braces or Orthoses: Knee Immobilizer - Left Restrictions LLE Weight Bearing: Weight bearing as tolerated    Mobility  Bed Mobility Overal bed mobility: Needs Assistance Bed Mobility: Supine to Sit     Supine to sit: Min guard        Transfers Overall transfer level: Needs assistance Equipment used: Rolling walker (2 wheeled) Transfers: Sit to/from Stand Sit to Stand: Min assist         General transfer comment: Pt did well wtih getting up to standing today, she still needs close guarding and cuing/encouragement.  She is able to maintain balance and overall shows improved mobility.  Ambulation/Gait Ambulation/Gait assistance: Min guard Ambulation Distance (Feet): 100 Feet Assistive device: Rolling walker (2 wheeled)       General Gait Details: Pt continues to have guarding with ambulation but is able to increase distance and speed.  KI still necessary secondary to quad control/weakness.   Stairs            Wheelchair Mobility    Modified Rankin (Stroke Patients Only)       Balance Overall balance assessment: Modified Independent                                  Cognition Arousal/Alertness: Awake/alert Behavior During Therapy: WFL for  tasks assessed/performed Overall Cognitive Status: Within Functional Limits for tasks assessed                      Exercises Total Joint Exercises Ankle Circles/Pumps: AROM;10 reps Quad Sets: Strengthening;10 reps Gluteal Sets: Strengthening;10 reps Short Arc Quad: AAROM;10 reps Heel Slides: PROM;5 reps Hip ABduction/ADduction: AROM;10 reps Straight Leg Raises: 10 reps;AAROM Knee Flexion: PROM;5 reps Goniometric ROM: 2-63    General Comments        Pertinent Vitals/Pain Pain Assessment: 0-10 Pain Score: 8     Home Living                      Prior Function            PT Goals (current goals can now be found in the care plan section) Progress towards PT goals: Progressing toward goals    Frequency  BID    PT Plan Current plan remains appropriate    Co-evaluation             End of Session Equipment Utilized During Treatment: Gait belt Activity Tolerance: Patient limited by pain Patient left: with call bell/phone within reach;with chair alarm set     Time: [id number] PT Time Calculation (min) (ACUTE ONLY): 41 min  Charges:  $Gait Training: 8-22 mins $Therapeutic  Exercise: 23-37 mins                    G Codes:      Kreg Shropshire, DPT 10/09/2015, 12:06 PM

## 2015-10-09 NOTE — Care Management Note (Addendum)
Case Management Note  Patient Details  Name: Patty Leon MRN: [id number] Date of Birth: 07/03/1960  Subjective/Objective:  Attempted to discuss home health PT services with [name] and find out what company she chose to be her home health provider. [name] said that the PA Riva and she discussed going to outpatient PT. This Probation officer advised her that [facility] is recommending home health PT, and that Medicaid has changed their rules and will now cover home health PT for CVA and joint replacement patients. This Probation officer asked Galen in [facility] to please discuss his recommendations with [name] and he agreed to do so. Case management will follow for discharge planning.                  Action/Plan:   Expected Discharge Date:                  Expected Discharge Plan:  [place]  In-House Referral:  Clinical Social Work  Discharge planning Services  CM Consult  Post Acute Care Choice:  Durable Medical Equipment, Home Health Choice offered to:  Patient  DME Arranged:  [place] Rile, 3-N-1 DME Agency:  [place]:  PT [place] Agency:  Other - See comment (No preference yet)  Status of Service:  In process, will continue to follow  If discussed at Long Length of Stay Meetings, dates discussed:    Additional Comments:  Skyrah Krupp A, RN 10/09/2015, 1:32 PM

## 2015-10-09 NOTE — Progress Notes (Signed)
   Subjective: 2 Days Post-Op Procedure(s) (LRB): TOTAL KNEE ARTHROPLASTY (Left) Patient reports pain as moderate.   Patient is well, and has had no acute complaints or problems We will start therapy today.  Plan is to go Rehab after hospital stay. However patient would like to go home. At this time she is not safe to go home no nausea and no vomiting Patient denies any chest pains or shortness of breath. Objective: Vital signs in last 24 hours: Temp:  [98.4 F (36.9 C)-98.9 F (37.2 C)] 98.4 F (36.9 C) (07/29 0753) Pulse Rate:  [91-105] 105 (07/29 0753) Resp:  [16-20] 18 (07/29 0753) BP: (119-145)/(71-87) 139/78 (07/29 0753) SpO2:  [82 %-95 %] 89 % (07/29 0753) well approximated incision Heels are non tender and elevated off the bed using rolled towels Intake/Output from previous day: 07/28 0701 - 07/29 0700 In: 960 [P.O.:960] Out: 350 [Urine:350] Intake/Output this shift: No intake/output data recorded.   Recent Labs  10/07/15 1232 10/07/15 1931 10/08/15 0552 10/09/15 0417  HGB 14.6 13.9 13.5 13.6    Recent Labs  10/08/15 0552 10/09/15 0417  WBC 8.8 10.1  RBC 4.46 4.52  HCT 40.0 40.2  PLT 236 224    Recent Labs  10/08/15 0552 10/09/15 0417  NA 136 132*  K 3.4* 3.6  CL 96* 88*  CO2 34* 35*  BUN 8 <5*  CREATININE 0.48 0.39*  GLUCOSE 142* 127*  CALCIUM 9.0 9.3   No results for input(s): LABPT, INR in the last 72 hours.  EXAM General - Patient is Alert, Appropriate and Oriented Extremity - Neurologically intact Neurovascular intact Sensation intact distally Intact pulses distally Compartment soft Dressing - dressing C/D/I Motor Function - intact, moving foot and toes well on exam.    Past Medical History:  Diagnosis Date  . Anxiety   . Arthritis   . Asthma   . COPD (chronic obstructive pulmonary disease) ([place])   . Diabetes mellitus without complication ([place])   . Diverticulosis   . Fluid retention   . GERD (gastroesophageal reflux  disease)   . Hypercholesteremia   . Hypertension   . Shortness of breath dyspnea   . Sleep apnea    Use C-PAP with oxygen    Assessment/Plan: 2 Days Post-Op Procedure(s) (LRB): TOTAL KNEE ARTHROPLASTY (Left) Active Problems:   Primary localized osteoarthritis of left knee  Estimated body mass index is 48.47 kg/m as calculated from the following:   Height as of this encounter: 5\' 2"  (1.575 m).   Weight as of this encounter: 120.2 kg (265 lb). Up with therapy Plan for discharge tomorrow Discharge to SNF  Labs: Reviewed DVT Prophylaxis - Lovenox, Foot Pumps and TED hose Weight-Bearing as tolerated to left leg D/C O2 and Pulse OX and try on Room Air Lactulose ordered. Did discuss with patient the criteria for going home. At this time she is to go home.  Jillyn Ledger. [place] 10/09/2015, 8:09 AM

## 2015-10-09 NOTE — Progress Notes (Signed)
Anticoagulation monitoring(Lovenox):  55 yo  female ordered Lovenox 30 mg Q12h  Filed Weights   10/07/15 1232  Weight: 265 lb (120.2 kg)   Body mass index is 48.47 kg/m.   Lab Results  Component Value Date   CREATININE 0.39 (L) 10/09/2015   CREATININE 0.48 10/08/2015   CREATININE 0.47 10/07/2015   Estimated Creatinine Clearance: 100.3 mL/min (by C-G formula based on SCr of 0.8 mg/dL). Hemoglobin & Hematocrit     Component Value Date/Time   HGB 13.6 10/09/2015 0417   HGB 14.3 06/11/2014 2212   HCT 40.2 10/09/2015 0417   HCT 43.5 06/11/2014 2212     Per Protocol for Patient with estCrcl > 30 ml/min and BMI > 40, will transition to Lovenox 40 mg Q12h. Hgb and plt count stable.

## 2015-10-09 NOTE — Progress Notes (Signed)
Physical Therapy Treatment Patient Details Name: Patty Leon MRN: [id number] DOB: [birth year] Today's Date: 10/09/2015    History of Present Illness Pt is 55 year old female s/p L TKA on 10/07/15.      PT Comments    Pt is able to ambulate with more confidence this afternoon, but still fatigues quickly and is reliant on the walker secondary to L sided limp.  She shows good effort but is pain limited and has been having consistent issues with stiffness and flexion ROM.  She has good attitude with PT, but stiffness and pain are a limiting factor.   Follow Up Recommendations   (Pt will require more than the allowed number of HHPT visits )     Equipment Recommendations       Recommendations for Other Services       Precautions / Restrictions Precautions Precautions: None Required Braces or Orthoses: Knee Immobilizer - Left Restrictions LLE Weight Bearing: Weight bearing as tolerated    Mobility  Bed Mobility Overal bed mobility: Needs Assistance Bed Mobility: Sit to Supine       Sit to supine: Min guard   General bed mobility comments: Pt shows great effort to get LEs up into bed using UEs to hold KI and get LE into bed  Transfers Overall transfer level: Modified independent Equipment used: Rolling walker (2 wheeled) Transfers: Sit to/from Stand Sit to Stand: Min guard         General transfer comment: PT shows good effort with getting to standing and though she struggles she is able to rise w/o direct assist  Ambulation/Gait Ambulation/Gait assistance: Min guard Ambulation Distance (Feet): 125 Feet Assistive device: Rolling walker (2 wheeled)       General Gait Details: Pt with continued limping on the L, but is able to ambulate w/o direct assist and though she is reliant on UEs she shows good motivation.  Pt fatigued with the effort, O2 donned during the bout.    Stairs            Wheelchair Mobility    Modified Rankin (Stroke Patients Only)        Balance Overall balance assessment: Modified Independent                                  Cognition Arousal/Alertness: Awake/alert Behavior During Therapy: WFL for tasks assessed/performed Overall Cognitive Status: Within Functional Limits for tasks assessed                      Exercises Total Joint Exercises Ankle Circles/Pumps: AROM;10 reps Quad Sets: Strengthening;10 reps Gluteal Sets: Strengthening;10 reps Short Arc Quad: AAROM;15 reps Heel Slides: PROM;5 reps Hip ABduction/ADduction: AROM;10 reps Straight Leg Raises: 10 reps;AAROM Knee Flexion: PROM;5 reps Goniometric ROM: pt still extremely stiff with knee flexion, excessive pain and effort to get to 60 degrees    General Comments        Pertinent Vitals/Pain Pain Score: 7     Home Living                      Prior Function            PT Goals (current goals can now be found in the care plan section) Progress towards PT goals: Progressing toward goals    Frequency  BID    PT Plan Current plan remains appropriate    Co-evaluation  End of Session Equipment Utilized During Treatment: Gait belt Activity Tolerance: Patient limited by pain Patient left: with bed alarm set;with call bell/phone within reach;with family/visitor present     Time: 1340-1425 PT Time Calculation (min) (ACUTE ONLY): 45 min  Charges:  $Gait Training: 8-22 mins $Therapeutic Exercise: 23-37 mins                    G Codes:      Kreg Shropshire, DPT 10/09/2015, 4:22 PM

## 2015-10-10 LAB — GLUCOSE, CAPILLARY
GLUCOSE-CAPILLARY: 161 mg/dL — AB (ref 65–99)
GLUCOSE-CAPILLARY: 124 mg/dL — AB (ref 65–99)

## 2015-10-10 MED ORDER — ENOXAPARIN SODIUM 40 MG/0.4ML ~~LOC~~ SOLN: 40.0000 mg | SUBCUTANEOUS | 0 refills | Status: DC

## 2015-10-10 MED ORDER — SODIUM CHLORIDE 0.9 % IV SOLN
INTRAVENOUS | Status: DC | PRN
  Administered 2015-10-07: 1500 mg via INTRAVENOUS

## 2015-10-10 NOTE — Progress Notes (Signed)
Patient was discharged today but investigational study inhalers were not sent home with the patient. RN spoke with patient who is aware that medications are still here and will come pick them up tomorrow.   Lenis Noon, PharmD 10/10/15 5:01 PM

## 2015-10-10 NOTE — Care Management Note (Addendum)
Case Management Note  Patient Details  Name: TAIYANA RETANA MRN: [id number] Date of Birth: September 22, 1960  Subjective/Objective:  Discussed discharge planning with Ms Lovena Le,  with Dr Marry Guan (weekend call), and with [facility]. The current discharge plan is discharge home today after she has a bowel movement. DME equipment was requested from [facility] last week. Ms Dedman will be transported by her sister to home today. A referral for home health PT was faxed to [facility] per Ms Suto's choice from list of providers. Lovenox co-pay=$3.00. Ms Leatherman verbalized understanding that Medicaid will only pay for 3 home health PT visits, and that at her first office visit with Dr Rudene Christians his office will set her up with Outpatient PT.                   Action/Plan:   Expected Discharge Date:                  Expected Discharge Plan:  [place]  In-House Referral:  Clinical Social Work  Discharge planning Services  CM Consult  Post Acute Care Choice:  Durable Medical Equipment, Home Health Choice offered to:  Patient  DME Arranged:  Gilford Rile, 3-N-1 DME Agency:  [place]:  PT [place] Agency:  Other - See comment (No preference yet)  Status of Service:  In process, will continue to follow  If discussed at Long Length of Stay Meetings, dates discussed:    Additional Comments:  Shetara Launer A, RN 10/10/2015, 8:40 AM

## 2015-10-10 NOTE — Progress Notes (Signed)
Physical Therapy Treatment Patient Details Name: Patty Leon MRN: [id number] DOB: October 11, 1960 Today's Date: 10/10/2015    History of Present Illness Pt is 55 year old female s/p L TKA on 10/07/15.      PT Comments    Pt continues to show good effort and motivation and though her knee remains stiff (flexion to 73) and she c/o greater than typical anterior hip/thigh pain.  She is able to circumambulate the nurses' station and though she is still unable to do SLR she did walk w/o KI and w/o knee buckling.   Follow Up Recommendations  Home health PT     Equipment Recommendations  Rolling walker with 5" wheels    Recommendations for Other Services       Precautions / Restrictions Precautions Precautions: None Required Braces or Orthoses: Knee Immobilizer - Left Restrictions LLE Weight Bearing: Weight bearing as tolerated    Mobility  Bed Mobility               General bed mobility comments: Pt in recliner on arrival  Transfers Overall transfer level: Modified independent Equipment used: Rolling walker (2 wheeled) Transfers: Sit to/from Stand Sit to Stand: Min guard         General transfer comment: Pt did well getting to standing, needs only minimal cuing, still struggling to use L LE to do much lifting and is reliant on UEs.  Ambulation/Gait Ambulation/Gait assistance: Min guard Ambulation Distance (Feet): 200 Feet Assistive device: Rolling walker (2 wheeled)       General Gait Details: Pt initially with some hesitation/stiffness but with increased time using it she is able to improve cadence/confidence and though she has some fatigue with the effort she ultimately shows ability to safely ambulate prolonged distances in the home.    Stairs            Wheelchair Mobility    Modified Rankin (Stroke Patients Only)       Balance                                    Cognition Arousal/Alertness: Awake/alert Behavior During Therapy:  WFL for tasks assessed/performed Overall Cognitive Status: Within Functional Limits for tasks assessed                      Exercises Total Joint Exercises Ankle Circles/Pumps: AROM;10 reps Quad Sets: Strengthening;10 reps Gluteal Sets: Strengthening;10 reps Heel Slides: PROM;5 reps Hip ABduction/ADduction: AROM;10 reps Long Arc Quad: AAROM;10 reps Knee Flexion: PROM;5 reps Goniometric ROM: 73    General Comments        Pertinent Vitals/Pain Pain Score: 5  Pain Location: continues to have severe pain in L knee with ROM acts    Home Living                      Prior Function            PT Goals (current goals can now be found in the care plan section) Progress towards PT goals: Progressing toward goals    Frequency  BID    PT Plan Current plan remains appropriate    Co-evaluation             End of Session Equipment Utilized During Treatment: Gait belt Activity Tolerance: Patient limited by pain Patient left: with call bell/phone within reach;with chair alarm set     Time:  [id number] ([id number]) PT Time Calculation (min) (ACUTE ONLY): 34 min  Charges:  $Gait Training: 8-22 mins $Therapeutic Exercise: 8-22 mins                    G Codes:      Kreg Shropshire, DPT 10/10/2015, 12:44 PM

## 2015-10-10 NOTE — Progress Notes (Signed)
Occupational Therapy Treatment Patient Details Name: Patty Leon MRN: [id number] DOB: 10/28/1960 Today's Date: 10/10/2015    History of present illness Pt is 55 year old female s/p L TKA on 10/07/15.     OT comments  Pt was eager to get up out of bed to get moving so she could have a BM to be cleared to go home.  However, she needed a lot of extra time and mod assist for LLE during bed mobility until she achieved sitting EOB position. She transitioned from EOB to recliner with CGA assist and min cues.  Discussed AD use for at home for LB dressing skills and rec a reacher at a bare minimum since she will be very unsafe if having to pick up cell phone or any other item she drops on the floor due to pain and decreased AROM L LE.  Also rec a fanny pack or walker bag on FWW for safe functional mobility.  Pt is to go home today and no further OT recommended.    Follow Up Recommendations  No OT follow up    Equipment Recommendations  Tub/shower bench (reacher and walker bag)    Recommendations for Other Services      Precautions / Restrictions Precautions Precautions: None Required Braces or Orthoses: Knee Immobilizer - Left Knee Immobilizer - Left: On at all times Restrictions Weight Bearing Restrictions: Yes LLE Weight Bearing: Weight bearing as tolerated       Mobility Bed Mobility                  Transfers                      Balance                                   ADL Overall ADL's : Needs assistance/impaired                                       General ADL Comments: Pt was eager to get up out of bed to get moving so she could have a BM to be cleared to go home.  However, she needed a lot of extra time and mod assist for LLE during bed mobility until she achieved sitting EOB position. She transitioned from EOB to recliner with CGA assist and min cues.  Discussed AD use for at home for LB dressing skills and rec a reacher  at a bare minimum since she will be very unsafe if having to pick up cell phone or any other item she drops on the floor due to pain and decreased AROM L LE.  Also rec a fanny pack or walker bag on FWW for safe functional mobility.        Vision                     Perception     Praxis      Cognition   Behavior During Therapy: [facility] for tasks assessed/performed Overall Cognitive Status: Within Functional Limits for tasks assessed                       Extremity/Trunk Assessment               Exercises  Shoulder Instructions       General Comments      Pertinent Vitals/ Pain       Pain Assessment: 0-10 Pain Score: 5  Pain Location: continued pain L knee  Pain Descriptors / Indicators: Constant;Sore;Operative site guarding Pain Intervention(s): Limited activity within patient's tolerance;Monitored during session;Premedicated before session;Ice applied;Repositioned  Home Living                                          Prior Functioning/Environment              Frequency Min 1X/week     Progress Toward Goals  OT Goals(current goals can now be found in the care plan section)  Progress towards OT goals: Progressing toward goals  Acute Rehab OT Goals Patient Stated Goal: to move without so much pain OT Goal Formulation: With patient/family Time For Goal Achievement: 10/22/15 Potential to Achieve Goals: Good  Plan Discharge plan needs to be updated    Co-evaluation                 End of Session Equipment Utilized During Treatment: Gait belt   Activity Tolerance Patient limited by pain   Patient Left in chair;with family/visitor present (PT arrived at end of session so chair alarm not set)   Nurse Communication          Time: 1010-1035 OT Time Calculation (min): 25 min  Charges: OT General Charges $OT Visit: 1 Procedure OT Treatments $Self Care/Home Management : 23-37 mins   Chrys Racer,  OTR/L ascom [phone] 10/10/2015, 4:41 PM

## 2015-10-10 NOTE — Progress Notes (Signed)
   Subjective: 3 Days Post-Op Procedure(s) (LRB): TOTAL KNEE ARTHROPLASTY (Left) Patient reports pain as 3 on 0-10 scale.   Patient is well, and has had no acute complaints or problems Continue with physical therapy today.  Plan is to go Home after hospital stay. no nausea and no vomiting Patient denies any chest pains or shortness of breath. Patient continues to complain of heaviness to the left leg but is able to move this on her own. Patient did much better yesterday with physical therapy and after some discussion with her this morning feel that she is probably able to go home once she does the lap around station today and has a bowel movement. Objective: Vital signs in last 24 hours: Temp:  [98.4 F (36.9 C)-100 F (37.8 C)] 99.9 F (37.7 C) (07/30 0320) Pulse Rate:  [92-119] 112 (07/30 0321) Resp:  [18] 18 (07/30 0320) BP: (120-139)/(62-78) 120/62 (07/30 0320) SpO2:  [83 %-100 %] 94 % (07/30 0321) well approximated incision Heels are non tender and elevated off the bed using rolled towels Intake/Output from previous day: 07/29 0701 - 07/30 0700 In: 120 [P.O.:120] Out: 2275 [Urine:2275] Intake/Output this shift: No intake/output data recorded.   Recent Labs  10/07/15 1232 10/07/15 1931 10/08/15 0552 10/09/15 0417  HGB 14.6 13.9 13.5 13.6    Recent Labs  10/08/15 0552 10/09/15 0417  WBC 8.8 10.1  RBC 4.46 4.52  HCT 40.0 40.2  PLT 236 224    Recent Labs  10/08/15 0552 10/09/15 0417  NA 136 132*  K 3.4* 3.6  CL 96* 88*  CO2 34* 35*  BUN 8 <5*  CREATININE 0.48 0.39*  GLUCOSE 142* 127*  CALCIUM 9.0 9.3   No results for input(s): LABPT, INR in the last 72 hours.  EXAM General - Patient is Alert, Appropriate and Oriented Extremity - Neurologically intact Neurovascular intact Sensation intact distally Intact pulses distally Dorsiflexion/Plantar flexion intact Compartment soft Dressing - dressing C/D/I Motor Function - intact, moving foot and toes  well on exam.    Past Medical History:  Diagnosis Date  . Anxiety   . Arthritis   . Asthma   . COPD (chronic obstructive pulmonary disease) ([place])   . Diabetes mellitus without complication ([place])   . Diverticulosis   . Fluid retention   . GERD (gastroesophageal reflux disease)   . Hypercholesteremia   . Hypertension   . Shortness of breath dyspnea   . Sleep apnea    Use C-PAP with oxygen    Assessment/Plan: 3 Days Post-Op Procedure(s) (LRB): TOTAL KNEE ARTHROPLASTY (Left) Active Problems:   Primary localized osteoarthritis of left knee  Estimated body mass index is 48.47 kg/m as calculated from the following:   Height as of this encounter: 5\' 2"  (1.575 m).   Weight as of this encounter: 120.2 kg (265 lb). Up with therapy Discharge home with home health  Labs: None DVT Prophylaxis - Lovenox, Foot Pumps and TED hose Weight-Bearing as tolerated to left leg Patient needs to have a bowel movement and do the lap around station as well as steps. She can do this with encouragement. Patient needs to have a bowel movement. We'll need to give lactulose as ordered. Once patient has done the lap around station steps and had a bowel movement she may be discharged to home  [place]. [place] 10/10/2015, 7:32 AM

## 2015-10-11 LAB — SURGICAL PATHOLOGY

## 2015-10-11 NOTE — Anesthesia Postprocedure Evaluation (Signed)
Anesthesia Post Note  Patient: Patty Leon  Procedure(s) Performed: Procedure(s) (LRB): TOTAL KNEE ARTHROPLASTY (Left)  Patient location during evaluation: PACU Anesthesia Type: Spinal Level of consciousness: awake Pain management: pain level controlled Vital Signs Assessment: post-procedure vital signs reviewed and stable Respiratory status: spontaneous breathing Cardiovascular status: blood pressure returned to baseline Anesthetic complications: no    Last Vitals:  Vitals:   10/10/15 0321 10/10/15 0800  BP:  115/73  Pulse: (!) 112 (!) 115  Resp:  16  Temp:  36.8 C    Last Pain:  Vitals:   10/10/15 1049  TempSrc:   PainSc: 5                  VAN STAVEREN,Leviathan Macera

## 2015-11-02 ENCOUNTER — Encounter
Admission: RE | Admit: 2015-11-02 | Discharge: 2015-11-02 | Disposition: A | Discharge status: Home / Self Care | Payer: Medicaid Other | Source: Ambulatory Visit | Attending: Orthopedic Surgery | Admitting: Orthopedic Surgery

## 2015-11-02 NOTE — Patient Instructions (Signed)
  Your procedure is scheduled on: 11-04-15 Report to Same Day Surgery 2nd floor medical mall To find out your arrival time please call [phone] between 1PM - 3PM on 11-03-15  Remember: Instructions that are not followed completely may result in serious medical risk, up to and including death, or upon the discretion of your surgeon and anesthesiologist your surgery may need to be rescheduled.    _x___ 1. Do not eat food or drink liquids after midnight. No gum chewing or hard candies.     __x__ 2. No Alcohol for 24 hours before or after surgery.   __x__3. No Smoking for 24 prior to surgery.   ____  4. Bring all medications with you on the day of surgery if instructed.    __x__ 5. Notify your doctor if there is any change in your medical condition     (cold, fever, infections).     Do not wear jewelry, make-up, hairpins, clips or nail polish.  Do not wear lotions, powders, or perfumes. You may wear deodorant.  Do not shave 48 hours prior to surgery. Men may shave face and neck.  Do not bring valuables to the hospital.    [facility] is not responsible for any belongings or valuables.               Contacts, dentures or bridgework may not be worn into surgery.  Leave your suitcase in the car. After surgery it may be brought to your room.  For patients admitted to the hospital, discharge time is determined by your treatment team.   Patients discharged the day of surgery will not be allowed to drive home.    Please read over the following fact sheets that you were given:   [facility] Preparing for Surgery and or MRSA Information   _x___ Take these medicines the morning of surgery with A SIP OF WATER:    1. AMLODIPINE  2. METOPROLOL  3. DALIRESP  4. PRILOSEC  5. TAKE AN EXTRA PRILOSEC ON Wednesday NIGHT BEFORE BED  6.  ____ Fleet Enema (as directed)   ____ Use CHG Soap or sage wipes as directed on instruction sheet   _X___ Use inhalers on the day of surgery and bring to  hospital day of [place]  _X___ Stop metformin 2 days prior to surgery-LAST DOSE NOW (PT LAST TOOK THIS AM-11-02-15)    ____ Take 1/2 of usual insulin dose the night before surgery and none on the morning of surgery.   ____ Stop aspirin or coumadin, or plavix  _x__ Stop Anti-inflammatories such as Advil, Aleve, Ibuprofen, Motrin, Naproxen,          Naprosyn, Goodies powders or aspirin products. Ok to take Tylenol.   ____ Stop supplements until after surgery.    _X___ Bring C-Pap to the hospital.

## 2015-11-04 ENCOUNTER — Encounter: Payer: Self-pay | Admitting: *Deleted

## 2015-11-04 ENCOUNTER — Encounter
Admission: RE | Disposition: A | Discharge status: Home / Self Care | Payer: Self-pay | Source: Ambulatory Visit | Attending: Orthopedic Surgery

## 2015-11-04 ENCOUNTER — Ambulatory Visit: Payer: Medicaid Other | Admitting: Anesthesiology

## 2015-11-04 ENCOUNTER — Ambulatory Visit
Admission: RE | Admit: 2015-11-04 | Discharge: 2015-11-04 | Disposition: A | Discharge status: Home / Self Care | Payer: Medicaid Other | Source: Ambulatory Visit | Attending: Orthopedic Surgery | Admitting: Orthopedic Surgery

## 2015-11-04 DIAGNOSIS — Z6841 Body Mass Index (BMI) 40.0 and over, adult: Secondary | ICD-10-CM | POA: Insufficient documentation

## 2015-11-04 DIAGNOSIS — Z86010 Personal history of colonic polyps: Secondary | ICD-10-CM | POA: Diagnosis not present

## 2015-11-04 DIAGNOSIS — G4710 Hypersomnia, unspecified: Secondary | ICD-10-CM | POA: Diagnosis not present

## 2015-11-04 DIAGNOSIS — K219 Gastro-esophageal reflux disease without esophagitis: Secondary | ICD-10-CM | POA: Insufficient documentation

## 2015-11-04 DIAGNOSIS — M24662 Ankylosis, left knee: Secondary | ICD-10-CM | POA: Insufficient documentation

## 2015-11-04 DIAGNOSIS — Z79899 Other long term (current) drug therapy: Secondary | ICD-10-CM | POA: Diagnosis not present

## 2015-11-04 DIAGNOSIS — M9689 Other intraoperative and postprocedural complications and disorders of the musculoskeletal system: Secondary | ICD-10-CM | POA: Diagnosis not present

## 2015-11-04 DIAGNOSIS — Z888 Allergy status to other drugs, medicaments and biological substances status: Secondary | ICD-10-CM | POA: Insufficient documentation

## 2015-11-04 DIAGNOSIS — E669 Obesity, unspecified: Secondary | ICD-10-CM | POA: Diagnosis not present

## 2015-11-04 DIAGNOSIS — I10 Essential (primary) hypertension: Secondary | ICD-10-CM | POA: Insufficient documentation

## 2015-11-04 DIAGNOSIS — Z809 Family history of malignant neoplasm, unspecified: Secondary | ICD-10-CM | POA: Insufficient documentation

## 2015-11-04 DIAGNOSIS — G4730 Sleep apnea, unspecified: Secondary | ICD-10-CM | POA: Insufficient documentation

## 2015-11-04 DIAGNOSIS — J449 Chronic obstructive pulmonary disease, unspecified: Secondary | ICD-10-CM | POA: Insufficient documentation

## 2015-11-04 DIAGNOSIS — E118 Type 2 diabetes mellitus with unspecified complications: Secondary | ICD-10-CM | POA: Diagnosis not present

## 2015-11-04 DIAGNOSIS — M1990 Unspecified osteoarthritis, unspecified site: Secondary | ICD-10-CM | POA: Diagnosis not present

## 2015-11-04 HISTORY — PX: KNEE CLOSED REDUCTION: SHX995

## 2015-11-04 LAB — GLUCOSE, CAPILLARY: Glucose-Capillary: 108 mg/dL — ABNORMAL HIGH (ref 65–99)

## 2015-11-04 SURGERY — MANIPULATION, KNEE, CLOSED
Anesthesia: General | Site: Knee | Laterality: Left

## 2015-11-04 MED ORDER — FENTANYL CITRATE (PF) 100 MCG/2ML IJ SOLN
25.0000 ug | INTRAMUSCULAR | Status: AC | PRN
  Administered 2015-11-04 (×6): 25 ug via INTRAVENOUS

## 2015-11-04 MED ORDER — OXYCODONE HCL 5 MG/5ML PO SOLN: 5.0000 mg | Freq: Once | ORAL | Status: AC | PRN

## 2015-11-04 MED ORDER — ONDANSETRON HCL 4 MG/2ML IJ SOLN
INTRAMUSCULAR | Status: DC | PRN
  Administered 2015-11-04: 4 mg via INTRAVENOUS

## 2015-11-04 MED ORDER — OXYCODONE HCL 5 MG PO TABS
5.0000 mg | ORAL_TABLET | Freq: Once | ORAL | Status: AC | PRN
  Administered 2015-11-04: 5 mg via ORAL

## 2015-11-04 MED ORDER — LACTATED RINGERS IV SOLN
INTRAVENOUS | Status: DC | PRN
  Administered 2015-11-04: 13:00:00 via INTRAVENOUS

## 2015-11-04 MED ORDER — FENTANYL CITRATE (PF) 100 MCG/2ML IJ SOLN
INTRAMUSCULAR | Status: AC
  Administered 2015-11-04: 25 ug via INTRAVENOUS
  Filled 2015-11-04: qty 2

## 2015-11-04 MED ORDER — PROPOFOL 10 MG/ML IV BOLUS
INTRAVENOUS | Status: DC | PRN
  Administered 2015-11-04: 150 mg via INTRAVENOUS

## 2015-11-04 MED ORDER — FENTANYL CITRATE (PF) 100 MCG/2ML IJ SOLN
INTRAMUSCULAR | Status: DC | PRN
  Administered 2015-11-04: 50 ug via INTRAVENOUS
  Administered 2015-11-04 (×2): 25 ug via INTRAVENOUS

## 2015-11-04 MED ORDER — MEPERIDINE HCL 25 MG/ML IJ SOLN: 6.2500 mg | INTRAMUSCULAR | Status: DC | PRN

## 2015-11-04 MED ORDER — OXYCODONE HCL 5 MG PO TABS
ORAL_TABLET | ORAL | Status: AC
  Filled 2015-11-04: qty 1

## 2015-11-04 MED ORDER — LIDOCAINE HCL (CARDIAC) 20 MG/ML IV SOLN
INTRAVENOUS | Status: DC | PRN
  Administered 2015-11-04: 100 mg via INTRAVENOUS

## 2015-11-04 MED ORDER — PROMETHAZINE HCL 25 MG/ML IJ SOLN: 6.2500 mg | INTRAMUSCULAR | Status: DC | PRN

## 2015-11-04 MED ORDER — SODIUM CHLORIDE 0.9 % IV SOLN
INTRAVENOUS | Status: DC
  Administered 2015-11-04: 12:00:00 via INTRAVENOUS

## 2015-11-04 SURGICAL SUPPLY — 1 items: KIT RM TURNOVER STRD PROC AR (KITS) ×3 IMPLANT

## 2015-11-04 NOTE — Op Note (Signed)
11/04/2015  1:52 PM  PATIENT:  Patty Leon  55 y.o. female  PRE-OPERATIVE DIAGNOSIS:  arthrofibrosis of knee joint,s/p total knee   POST-OPERATIVE DIAGNOSIS:  arthrofibrosis of knee joint,s/p total knee   PROCEDURE:  Procedure(s): CLOSED MANIPULATION KNEE (Left)  SURGEON: Laurene Footman, MD  ASSISTANTS: None  ANESTHESIA:   general  EBL:  No intake/output data recorded.  BLOOD ADMINISTERED:none  DRAINS: none   LOCAL MEDICATIONS USED:  NONE  SPECIMEN:  No Specimen  DISPOSITION OF SPECIMEN:  N/A  COUNTS:  NO Case was done without opening any equipment so no count was required  TOURNIQUET:  * No tourniquets in log *  IMPLANTS: None  DICTATION: .Dragon Dictation patient was brought to the operating room and left on her stretcher. After patient identification and timeout procedures were completed and general anesthetic had been obtained the leg was placed out straight and had approximate 15 flexion contracture with gentle pressure on the kneecap. Staging could be obtained. The leg up into flexion there with flexion with approximate 65 and gentle manipulation was used to push on the proximal tibia getting back to 110. this was repeated several times to get this motion and the knee was stable without any complication.  PLAN OF CARE: Discharge to home after PACU  PATIENT DISPOSITION:  PACU - hemodynamically stable.

## 2015-11-04 NOTE — Transfer of Care (Signed)
Immediate Anesthesia Transfer of Care Note  Patient: Patty Leon  Procedure(s) Performed: Procedure(s): CLOSED MANIPULATION KNEE (Left)  Patient Location: PACU  Anesthesia Type:General  Level of Consciousness: awake and alert   Airway & Oxygen Therapy: Patient Spontanous Breathing and Patient connected to face mask oxygen  Post-op Assessment: Report given to RN and Post -op Vital signs reviewed and stable  Post vital signs: Reviewed and stable  Last Vitals:  Vitals:   11/04/15 1147 11/04/15 1359  BP: (!) 93/54 119/77  Pulse: 80 84  Resp: 17 17  Temp: 37.2 C     Last Pain:  Vitals:   11/04/15 1147  TempSrc: Oral  PainSc: 8       Patients Stated Pain Goal: 1 ([id number])  Complications: No apparent anesthesia complications

## 2015-11-04 NOTE — H&P (Signed)
Reviewed paper H+P, will be scanned into chart. No changes noted.  

## 2015-11-04 NOTE — Anesthesia Preprocedure Evaluation (Signed)
Anesthesia Evaluation  Patient identified by MRN, date of birth, ID band Patient awake    Reviewed: Allergy & Precautions, NPO status , Patient's Chart, lab work & pertinent test results, reviewed documented beta blocker date and time   History of Anesthesia Complications Negative for: history of anesthetic complications  Airway Mallampati: II  TM Distance: >3 FB Neck ROM: Full    Dental  (+) Upper Dentures, Missing   Pulmonary shortness of breath and with exertion, sleep apnea, Continuous Positive Airway Pressure Ventilation and Oxygen sleep apnea , COPD, Current Smoker,    breath sounds clear to auscultation- rhonchi (-) wheezing      Cardiovascular hypertension, Pt. on medications and Pt. on home beta blockers (-) CAD and (-) Past MI  Rhythm:Regular Rate:Normal - Systolic murmurs and - Diastolic murmurs    Neuro/Psych Anxiety negative neurological ROS     GI/Hepatic Neg liver ROS, GERD  ,  Endo/Other  diabetes, Type 2, Oral Hypoglycemic Agents  Renal/GU negative Renal ROS     Musculoskeletal  (+) Arthritis , Osteoarthritis,    Abdominal (+) + obese,   Peds  Hematology negative hematology ROS (+)   Anesthesia Other Findings Past Medical History: No date: Anxiety No date: Arthritis No date: COPD (chronic obstructive pulmonary disease) (* No date: Diabetes mellitus without complication (H[facility]) No date: Diverticulosis No date: Fluid retention No date: GERD (gastroesophageal reflux disease) No date: Hypercholesteremia No date: Hypertension No date: Shortness of breath dyspnea No date: Sleep apnea     Comment: Use C-PAP with oxygen   Reproductive/Obstetrics                             Anesthesia Physical Anesthesia Plan  ASA: III  Anesthesia Plan: General   Post-op Pain Management:    Induction: Intravenous  Airway Management Planned: LMA  Additional Equipment:   Intra-op  Plan:   Post-operative Plan:   Informed Consent: I have reviewed the patients History and Physical, chart, labs and discussed the procedure including the risks, benefits and alternatives for the proposed anesthesia with the patient or authorized representative who has indicated his/her understanding and acceptance.   Dental advisory given  Plan Discussed with: Anesthesiologist and CRNA  Anesthesia Plan Comments:         Anesthesia Quick Evaluation

## 2015-11-04 NOTE — Discharge Instructions (Signed)
Work on range of motion today. Use Polar Care care wrap received after surgery today to try to minimize swelling

## 2015-11-04 NOTE — Anesthesia Postprocedure Evaluation (Signed)
Anesthesia Post Note  Patient: Patty Leon  Procedure(s) Performed: Procedure(s) (LRB): CLOSED MANIPULATION KNEE (Left)  Patient location during evaluation: PACU Anesthesia Type: General Level of consciousness: awake and alert and oriented Pain management: pain level controlled Vital Signs Assessment: post-procedure vital signs reviewed and stable Respiratory status: spontaneous breathing, nonlabored ventilation and respiratory function stable Cardiovascular status: blood pressure returned to baseline and stable Postop Assessment: no signs of nausea or vomiting Anesthetic complications: no    Last Vitals:  Vitals:   11/04/15 1442 11/04/15 1447  BP: 109/67   Pulse: 86 82  Resp: 20 20  Temp:      Last Pain:  Vitals:   11/04/15 1447  TempSrc:   PainSc: 7                  Caralina Nop

## 2015-11-04 NOTE — Anesthesia Procedure Notes (Signed)
Procedure Name: LMA Insertion Performed by: Josefa [place] Pre-anesthesia Checklist: Patient identified, Patient being monitored, Timeout performed, Emergency Drugs available and Suction available Patient Re-evaluated:Patient Re-evaluated prior to inductionOxygen Delivery Method: Circle system utilized Preoxygenation: Pre-oxygenation with 100% oxygen Intubation Type: IV induction Ventilation: Mask ventilation without difficulty LMA: LMA inserted LMA Size: 4.0 Tube type: Oral Number of attempts: 1 Placement Confirmation: positive ETCO2 and breath sounds checked- equal and bilateral Tube secured with: Tape Dental Injury: Teeth and Oropharynx as per pre-operative assessment        

## 2015-11-05 ENCOUNTER — Encounter: Payer: Self-pay | Admitting: Orthopedic Surgery

## 2015-12-27 ENCOUNTER — Other Ambulatory Visit: Payer: Self-pay | Admitting: Orthopedic Surgery

## 2015-12-27 DIAGNOSIS — Z96652 Presence of left artificial knee joint: Principal | ICD-10-CM

## 2015-12-27 DIAGNOSIS — T8484XA Pain due to internal orthopedic prosthetic devices, implants and grafts, initial encounter: Secondary | ICD-10-CM

## 2016-01-04 ENCOUNTER — Encounter
Admission: RE | Admit: 2016-01-04 | Discharge: 2016-01-04 | Disposition: A | Discharge status: Home / Self Care | Payer: Medicaid Other | Source: Ambulatory Visit | Attending: Orthopedic Surgery | Admitting: Orthopedic Surgery

## 2016-01-04 ENCOUNTER — Ambulatory Visit
Admission: RE | Admit: 2016-01-04 | Discharge: 2016-01-04 | Disposition: A | Discharge status: Home / Self Care | Payer: Medicaid Other | Source: Ambulatory Visit | Attending: Orthopedic Surgery | Admitting: Orthopedic Surgery

## 2016-01-04 DIAGNOSIS — Z96659 Presence of unspecified artificial knee joint: Secondary | ICD-10-CM | POA: Insufficient documentation

## 2016-01-04 DIAGNOSIS — Y929 Unspecified place or not applicable: Secondary | ICD-10-CM | POA: Insufficient documentation

## 2016-01-04 DIAGNOSIS — M25562 Pain in left knee: Secondary | ICD-10-CM | POA: Diagnosis present

## 2016-01-04 DIAGNOSIS — X58XXXA Exposure to other specified factors, initial encounter: Secondary | ICD-10-CM | POA: Diagnosis not present

## 2016-01-04 DIAGNOSIS — Y939 Activity, unspecified: Secondary | ICD-10-CM | POA: Insufficient documentation

## 2016-01-04 DIAGNOSIS — Y999 Unspecified external cause status: Secondary | ICD-10-CM | POA: Insufficient documentation

## 2016-01-04 DIAGNOSIS — T8484XA Pain due to internal orthopedic prosthetic devices, implants and grafts, initial encounter: Secondary | ICD-10-CM

## 2016-01-04 DIAGNOSIS — Z96652 Presence of left artificial knee joint: Principal | ICD-10-CM

## 2016-01-04 MED ORDER — TECHNETIUM TC 99M MEDRONATE IV KIT
21.7600 | PACK | Freq: Once | INTRAVENOUS | Status: AC | PRN
  Administered 2016-01-04: 21.76 via INTRAVENOUS

## 2016-01-25 ENCOUNTER — Encounter
Admission: RE | Admit: 2016-01-25 | Discharge: 2016-01-25 | Disposition: A | Discharge status: Home / Self Care | Payer: Medicaid Other | Source: Ambulatory Visit | Attending: Orthopedic Surgery | Admitting: Orthopedic Surgery

## 2016-01-25 DIAGNOSIS — Z01812 Encounter for preprocedural laboratory examination: Secondary | ICD-10-CM | POA: Diagnosis not present

## 2016-01-25 LAB — SURGICAL PCR SCREEN
MRSA, PCR: NEGATIVE
Staphylococcus aureus: NEGATIVE

## 2016-01-25 LAB — URINALYSIS COMPLETE WITH MICROSCOPIC (ARMC ONLY)
BILIRUBIN URINE: NEGATIVE
GLUCOSE, UA: NEGATIVE mg/dL
Ketones, ur: NEGATIVE mg/dL
Leukocytes, UA: NEGATIVE
NITRITE: NEGATIVE
Protein, ur: NEGATIVE mg/dL
SPECIFIC GRAVITY, URINE: 1.006 (ref 1.005–1.030)
pH: 6 (ref 5.0–8.0)

## 2016-01-25 LAB — APTT: APTT: 49 s — AB (ref 24–36)

## 2016-01-25 LAB — BASIC METABOLIC PANEL
ANION GAP: 8 (ref 5–15)
BUN: 14 mg/dL (ref 6–20)
CALCIUM: 9.6 mg/dL (ref 8.9–10.3)
CO2: 34 mmol/L — ABNORMAL HIGH (ref 22–32)
Chloride: 95 mmol/L — ABNORMAL LOW (ref 101–111)
Creatinine, Ser: 0.63 mg/dL (ref 0.44–1.00)
GLUCOSE: 99 mg/dL (ref 65–99)
Potassium: 3.7 mmol/L (ref 3.5–5.1)
SODIUM: 137 mmol/L (ref 135–145)

## 2016-01-25 LAB — TYPE AND SCREEN
ABO/RH(D): O POS
Antibody Screen: NEGATIVE

## 2016-01-25 LAB — PROTIME-INR
INR: 1.61
Prothrombin Time: 19.3 seconds — ABNORMAL HIGH (ref 11.4–15.2)

## 2016-01-25 LAB — CBC
HCT: 42.2 % (ref 35.0–47.0)
Hemoglobin: 14.5 g/dL (ref 12.0–16.0)
MCH: 29.5 pg (ref 26.0–34.0)
MCHC: 34.4 g/dL (ref 32.0–36.0)
MCV: 85.9 fL (ref 80.0–100.0)
PLATELETS: 279 10*3/uL (ref 150–440)
RBC: 4.91 MIL/uL (ref 3.80–5.20)
RDW: 15.4 % — AB (ref 11.5–14.5)
WBC: 6.5 10*3/uL (ref 3.6–11.0)

## 2016-01-25 LAB — SEDIMENTATION RATE: Sed Rate: 59 mm/hr — ABNORMAL HIGH (ref 0–30)

## 2016-01-25 NOTE — Patient Instructions (Addendum)
  Your procedure is scheduled on: Tuesday, February 08, 2016 Report to Same Day Surgery 2nd floor medical mall To find out your arrival time please call [phone] between 1PM - 3PM on Monday, November 27th  Remember: Instructions that are not followed completely may result in serious medical risk, up to and including death, or upon the discretion of your surgeon and anesthesiologist your surgery may need to be rescheduled.    _x___ 1. Do not eat food or drink liquids after midnight. No gum chewing or hard candies.     __x__ 2. No Alcohol for 24 hours before or after surgery.   __x__3. No Smoking for 24 prior to surgery.   ____  4. Bring all medications with you on the day of surgery if instructed.    __x__ 5. Notify your doctor if there is any change in your medical condition     (cold, fever, infections).     Do not wear jewelry, make-up, hairpins, clips or nail polish.  Do not wear lotions, powders, or perfumes. You may wear deodorant.  Do not shave 48 hours prior to surgery. Men may shave face and neck.  Do not bring valuables to the hospital.    [facility] is not responsible for any belongings or valuables.               Contacts, dentures or bridgework may not be worn into surgery.  Leave your suitcase in the car. After surgery it may be brought to your room.  For patients admitted to the hospital, discharge time is determined by your treatment team.   Patients discharged the day of surgery will not be allowed to drive home.    Please read over the following fact sheets that you were given:   [facility] Preparing for Surgery and or MRSA Information   _x___ Take these medicines the morning of surgery with A SIP OF WATER:    1. AMLODIPINE  2. METOPROLOL  3. OMEPRAZOLE (TAKE 1 CAPSULE THE NIGHT BEFORE SURGERY AND 1 CAPSULE THE MORNING OF SURGERY)  4. DALIRESP  5. GRALISE  6.  ____Fleets enema or Magnesium Citrate as directed.   ___ Use CHG Soap or sage wipes as  directed on instruction sheet   _x___ Use inhalers on the day of surgery and bring to hospital day of surgery  __x__ Stop metformin 2 days prior to surgery (LAST DOSE ON Saturday NIGHT  02/05/16)   ____ Take 1/2 of usual insulin dose the night before surgery and none on the morning of  surgery.   __x__ [place] ON Friday, November 24TH (LAST DOSE WILL BE ON Friday November 24TH)  x__ Stop Anti-inflammatories such as Advil, Aleve, Ibuprofen, Motrin, Naproxen,          Naprosyn, Goodies powders or aspirin products. Ok to take Tylenol.   __x__ Stop supplements until after surgery.    ____ Bring C-Pap to the hospital.

## 2016-01-26 LAB — URINE CULTURE: Culture: NO GROWTH

## 2016-02-07 MED ORDER — CEFAZOLIN SODIUM-DEXTROSE 2-4 GM/100ML-% IV SOLN
2.0000 g | Freq: Once | INTRAVENOUS | Status: AC
  Administered 2016-02-08: 2 g via INTRAVENOUS

## 2016-02-07 MED ORDER — TRANEXAMIC ACID 1000 MG/10ML IV SOLN
1000.0000 mg | INTRAVENOUS | Status: DC
  Filled 2016-02-07: qty 10

## 2016-02-08 ENCOUNTER — Inpatient Hospital Stay: Payer: Medicaid Other

## 2016-02-08 ENCOUNTER — Encounter
Admission: RE | Disposition: A | Discharge status: Home Health | Payer: Self-pay | Source: Ambulatory Visit | Attending: Orthopedic Surgery

## 2016-02-08 ENCOUNTER — Inpatient Hospital Stay
Admission: RE | Admit: 2016-02-08 | Discharge: 2016-02-11 | DRG: 467 | Disposition: A | Discharge status: Home Health | Payer: Medicaid Other | Source: Ambulatory Visit | Attending: Orthopedic Surgery | Admitting: Orthopedic Surgery

## 2016-02-08 ENCOUNTER — Inpatient Hospital Stay: Payer: Medicaid Other | Admitting: Anesthesiology

## 2016-02-08 ENCOUNTER — Encounter: Payer: Self-pay | Admitting: *Deleted

## 2016-02-08 DIAGNOSIS — G4730 Sleep apnea, unspecified: Secondary | ICD-10-CM | POA: Diagnosis present

## 2016-02-08 DIAGNOSIS — T84018A Broken internal joint prosthesis, other site, initial encounter: Secondary | ICD-10-CM

## 2016-02-08 DIAGNOSIS — M6281 Muscle weakness (generalized): Secondary | ICD-10-CM

## 2016-02-08 DIAGNOSIS — E669 Obesity, unspecified: Secondary | ICD-10-CM | POA: Diagnosis present

## 2016-02-08 DIAGNOSIS — Y792 Prosthetic and other implants, materials and accessory orthopedic devices associated with adverse incidents: Secondary | ICD-10-CM | POA: Diagnosis present

## 2016-02-08 DIAGNOSIS — R000 Tachycardia, unspecified: Secondary | ICD-10-CM | POA: Diagnosis not present

## 2016-02-08 DIAGNOSIS — J449 Chronic obstructive pulmonary disease, unspecified: Secondary | ICD-10-CM | POA: Diagnosis present

## 2016-02-08 DIAGNOSIS — R262 Difficulty in walking, not elsewhere classified: Secondary | ICD-10-CM

## 2016-02-08 DIAGNOSIS — Z7901 Long term (current) use of anticoagulants: Secondary | ICD-10-CM | POA: Diagnosis not present

## 2016-02-08 DIAGNOSIS — Z79891 Long term (current) use of opiate analgesic: Secondary | ICD-10-CM

## 2016-02-08 DIAGNOSIS — K219 Gastro-esophageal reflux disease without esophagitis: Secondary | ICD-10-CM | POA: Diagnosis present

## 2016-02-08 DIAGNOSIS — E119 Type 2 diabetes mellitus without complications: Secondary | ICD-10-CM | POA: Diagnosis present

## 2016-02-08 DIAGNOSIS — Z79899 Other long term (current) drug therapy: Secondary | ICD-10-CM

## 2016-02-08 DIAGNOSIS — Z888 Allergy status to other drugs, medicaments and biological substances status: Secondary | ICD-10-CM

## 2016-02-08 DIAGNOSIS — I10 Essential (primary) hypertension: Secondary | ICD-10-CM | POA: Diagnosis present

## 2016-02-08 DIAGNOSIS — F17210 Nicotine dependence, cigarettes, uncomplicated: Secondary | ICD-10-CM | POA: Diagnosis present

## 2016-02-08 DIAGNOSIS — E7800 Pure hypercholesterolemia, unspecified: Secondary | ICD-10-CM | POA: Diagnosis present

## 2016-02-08 DIAGNOSIS — F419 Anxiety disorder, unspecified: Secondary | ICD-10-CM | POA: Diagnosis present

## 2016-02-08 DIAGNOSIS — Z6841 Body Mass Index (BMI) 40.0 and over, adult: Secondary | ICD-10-CM | POA: Diagnosis not present

## 2016-02-08 DIAGNOSIS — G8918 Other acute postprocedural pain: Secondary | ICD-10-CM

## 2016-02-08 DIAGNOSIS — T8484XA Pain due to internal orthopedic prosthetic devices, implants and grafts, initial encounter: Secondary | ICD-10-CM | POA: Diagnosis present

## 2016-02-08 DIAGNOSIS — Z7984 Long term (current) use of oral hypoglycemic drugs: Secondary | ICD-10-CM

## 2016-02-08 DIAGNOSIS — Z96659 Presence of unspecified artificial knee joint: Secondary | ICD-10-CM

## 2016-02-08 DIAGNOSIS — T84093A Other mechanical complication of internal left knee prosthesis, initial encounter: Secondary | ICD-10-CM | POA: Diagnosis present

## 2016-02-08 HISTORY — PX: TOTAL KNEE REVISION: SHX996

## 2016-02-08 LAB — PROTIME-INR
INR: 0.81
PROTHROMBIN TIME: 11.2 s — AB (ref 11.4–15.2)

## 2016-02-08 LAB — GLUCOSE, CAPILLARY
GLUCOSE-CAPILLARY: 158 mg/dL — AB (ref 65–99)
GLUCOSE-CAPILLARY: 226 mg/dL — AB (ref 65–99)
Glucose-Capillary: 123 mg/dL — ABNORMAL HIGH (ref 65–99)
Glucose-Capillary: 164 mg/dL — ABNORMAL HIGH (ref 65–99)

## 2016-02-08 SURGERY — TOTAL KNEE REVISION
Anesthesia: Spinal | Laterality: Left | Wound class: Clean

## 2016-02-08 MED ORDER — DOCUSATE SODIUM 100 MG PO CAPS: 100.0000 mg | ORAL_CAPSULE | Freq: Every day | ORAL | Status: DC

## 2016-02-08 MED ORDER — MIDAZOLAM HCL 5 MG/5ML IJ SOLN
INTRAMUSCULAR | Status: DC | PRN
  Administered 2016-02-08: 2 mg via INTRAVENOUS

## 2016-02-08 MED ORDER — HYDROMORPHONE HCL 1 MG/ML IJ SOLN
INTRAMUSCULAR | Status: AC
  Administered 2016-02-08: 0.5 mg via INTRAVENOUS
  Filled 2016-02-08: qty 1

## 2016-02-08 MED ORDER — PANTOPRAZOLE SODIUM 40 MG PO TBEC
80.0000 mg | DELAYED_RELEASE_TABLET | Freq: Every day | ORAL | Status: DC
  Administered 2016-02-09 – 2016-02-11 (×3): 80 mg via ORAL
  Filled 2016-02-08 (×4): qty 2

## 2016-02-08 MED ORDER — PHENOL 1.4 % MT LIQD
1.0000 | OROMUCOSAL | Status: DC | PRN
  Filled 2016-02-08: qty 177

## 2016-02-08 MED ORDER — MORPHINE SULFATE (PF) 4 MG/ML IV SOLN
4.0000 mg | INTRAVENOUS | Status: DC | PRN
  Administered 2016-02-08 – 2016-02-09 (×9): 4 mg via INTRAVENOUS
  Filled 2016-02-08 (×10): qty 1

## 2016-02-08 MED ORDER — SODIUM CHLORIDE 0.9 % IV SOLN
INTRAVENOUS | Status: DC | PRN
  Administered 2016-02-08: 1000 mg via INTRAVENOUS

## 2016-02-08 MED ORDER — METHOCARBAMOL 500 MG PO TABS
500.0000 mg | ORAL_TABLET | Freq: Four times a day (QID) | ORAL | Status: DC | PRN
  Administered 2016-02-08 – 2016-02-10 (×2): 500 mg via ORAL
  Filled 2016-02-08 (×2): qty 1

## 2016-02-08 MED ORDER — METHOCARBAMOL 1000 MG/10ML IJ SOLN
500.0000 mg | Freq: Four times a day (QID) | INTRAMUSCULAR | Status: DC | PRN
  Filled 2016-02-08: qty 5

## 2016-02-08 MED ORDER — SUGAMMADEX SODIUM 200 MG/2ML IV SOLN
INTRAVENOUS | Status: DC | PRN
  Administered 2016-02-08: 235.8 mg via INTRAVENOUS

## 2016-02-08 MED ORDER — NON FORMULARY: 2.0000 | Freq: Two times a day (BID) | Status: DC

## 2016-02-08 MED ORDER — PROPOFOL 10 MG/ML IV BOLUS
INTRAVENOUS | Status: DC | PRN
  Administered 2016-02-08: 200 mg via INTRAVENOUS

## 2016-02-08 MED ORDER — NON FORMULARY: 2.0000 | Status: DC | PRN

## 2016-02-08 MED ORDER — MAGNESIUM HYDROXIDE 400 MG/5ML PO SUSP: 30.0000 mL | Freq: Every day | ORAL | Status: DC | PRN

## 2016-02-08 MED ORDER — BUPIVACAINE-EPINEPHRINE (PF) 0.5% -1:200000 IJ SOLN
INTRAMUSCULAR | Status: DC | PRN
  Administered 2016-02-08: 30 mL via PERINEURAL

## 2016-02-08 MED ORDER — METOPROLOL TARTRATE 50 MG PO TABS
50.0000 mg | ORAL_TABLET | ORAL | Status: DC
  Administered 2016-02-09 – 2016-02-11 (×3): 50 mg via ORAL
  Filled 2016-02-08 (×3): qty 1

## 2016-02-08 MED ORDER — IPRATROPIUM-ALBUTEROL 0.5-2.5 (3) MG/3ML IN SOLN
RESPIRATORY_TRACT | Status: AC
  Administered 2016-02-08: 3 mL via RESPIRATORY_TRACT
  Filled 2016-02-08: qty 3

## 2016-02-08 MED ORDER — ACETAMINOPHEN 10 MG/ML IV SOLN
INTRAVENOUS | Status: AC
  Filled 2016-02-08: qty 100

## 2016-02-08 MED ORDER — OXYCODONE HCL 5 MG/5ML PO SOLN: 5.0000 mg | Freq: Once | ORAL | Status: DC | PRN

## 2016-02-08 MED ORDER — METOCLOPRAMIDE HCL 10 MG PO TABS: 5.0000 mg | ORAL_TABLET | Freq: Three times a day (TID) | ORAL | Status: DC | PRN

## 2016-02-08 MED ORDER — FENTANYL CITRATE (PF) 100 MCG/2ML IJ SOLN
INTRAMUSCULAR | Status: AC
  Administered 2016-02-08: 25 ug via INTRAVENOUS
  Filled 2016-02-08: qty 2

## 2016-02-08 MED ORDER — POLYETHYLENE GLYCOL 3350 17 G PO PACK: 17.0000 g | PACK | Freq: Every day | ORAL | Status: DC | PRN

## 2016-02-08 MED ORDER — VITAMIN D 1000 UNITS PO TABS
2000.0000 [IU] | ORAL_TABLET | Freq: Every day | ORAL | Status: DC
  Administered 2016-02-09 – 2016-02-11 (×3): 2000 [IU] via ORAL
  Filled 2016-02-08 (×3): qty 2

## 2016-02-08 MED ORDER — ONDANSETRON HCL 4 MG/2ML IJ SOLN: 4.0000 mg | Freq: Four times a day (QID) | INTRAMUSCULAR | Status: DC | PRN

## 2016-02-08 MED ORDER — GLYCOPYRROLATE 0.2 MG/ML IJ SOLN
INTRAMUSCULAR | Status: DC | PRN
  Administered 2016-02-08: 0.2 mg via INTRAVENOUS

## 2016-02-08 MED ORDER — FENTANYL CITRATE (PF) 100 MCG/2ML IJ SOLN
25.0000 ug | INTRAMUSCULAR | Status: DC | PRN
  Administered 2016-02-08 (×4): 25 ug via INTRAVENOUS

## 2016-02-08 MED ORDER — IPRATROPIUM-ALBUTEROL 0.5-2.5 (3) MG/3ML IN SOLN
3.0000 mL | Freq: Once | RESPIRATORY_TRACT | Status: AC | PRN
  Administered 2016-02-08: 3 mL via RESPIRATORY_TRACT

## 2016-02-08 MED ORDER — BISACODYL 10 MG RE SUPP: 10.0000 mg | Freq: Every day | RECTAL | Status: DC | PRN

## 2016-02-08 MED ORDER — MORPHINE SULFATE (PF) 10 MG/ML IV SOLN
INTRAVENOUS | Status: AC
  Filled 2016-02-08: qty 1

## 2016-02-08 MED ORDER — ROFLUMILAST 500 MCG PO TABS
500.0000 ug | ORAL_TABLET | ORAL | Status: DC
  Administered 2016-02-09 – 2016-02-11 (×3): 500 ug via ORAL
  Filled 2016-02-08 (×4): qty 1

## 2016-02-08 MED ORDER — SODIUM CHLORIDE 0.9 % IV SOLN
INTRAVENOUS | Status: DC
  Administered 2016-02-08: 15:00:00 via INTRAVENOUS

## 2016-02-08 MED ORDER — BUPIVACAINE LIPOSOME 1.3 % IJ SUSP
INTRAMUSCULAR | Status: AC
  Filled 2016-02-08: qty 20

## 2016-02-08 MED ORDER — INSULIN ASPART 100 UNIT/ML ~~LOC~~ SOLN
0.0000 [IU] | Freq: Three times a day (TID) | SUBCUTANEOUS | Status: DC
  Administered 2016-02-08: 5 [IU] via SUBCUTANEOUS
  Administered 2016-02-09 (×3): 2 [IU] via SUBCUTANEOUS
  Filled 2016-02-08 (×2): qty 2
  Filled 2016-02-08: qty 5
  Filled 2016-02-08: qty 2

## 2016-02-08 MED ORDER — SODIUM CHLORIDE 0.9 % IJ SOLN
INTRAMUSCULAR | Status: AC
  Filled 2016-02-08: qty 50

## 2016-02-08 MED ORDER — DIPHENHYDRAMINE HCL 50 MG/ML IJ SOLN
12.5000 mg | Freq: Once | INTRAMUSCULAR | Status: AC
  Administered 2016-02-08: 12.5 mg via INTRAVENOUS

## 2016-02-08 MED ORDER — VITAMIN D 50 MCG (2000 UT) PO CAPS: 2000.0000 [IU] | ORAL_CAPSULE | Freq: Every day | ORAL | Status: DC

## 2016-02-08 MED ORDER — ACETAMINOPHEN 325 MG PO TABS: 650.0000 mg | ORAL_TABLET | Freq: Four times a day (QID) | ORAL | Status: DC | PRN

## 2016-02-08 MED ORDER — OXYCODONE HCL 5 MG PO TABS
15.0000 mg | ORAL_TABLET | ORAL | Status: DC | PRN
  Administered 2016-02-08 – 2016-02-11 (×11): 15 mg via ORAL
  Filled 2016-02-08 (×11): qty 3

## 2016-02-08 MED ORDER — HYDROMORPHONE HCL 1 MG/ML IJ SOLN
0.2500 mg | INTRAMUSCULAR | Status: DC | PRN
  Administered 2016-02-08 (×4): 0.5 mg via INTRAVENOUS

## 2016-02-08 MED ORDER — MENTHOL 3 MG MT LOZG
1.0000 | LOZENGE | OROMUCOSAL | Status: DC | PRN
  Filled 2016-02-08: qty 9

## 2016-02-08 MED ORDER — MAGNESIUM CITRATE PO SOLN
1.0000 | Freq: Once | ORAL | Status: AC | PRN
  Administered 2016-02-10: 1 via ORAL
  Filled 2016-02-08 (×2): qty 296

## 2016-02-08 MED ORDER — SUCCINYLCHOLINE CHLORIDE 20 MG/ML IJ SOLN
INTRAMUSCULAR | Status: DC | PRN
  Administered 2016-02-08: 120 mg via INTRAVENOUS

## 2016-02-08 MED ORDER — CEFAZOLIN SODIUM-DEXTROSE 2-4 GM/100ML-% IV SOLN
2.0000 g | Freq: Four times a day (QID) | INTRAVENOUS | Status: AC
  Administered 2016-02-08 (×2): 2 g via INTRAVENOUS
  Filled 2016-02-08 (×3): qty 100

## 2016-02-08 MED ORDER — METOCLOPRAMIDE HCL 5 MG/ML IJ SOLN: 5.0000 mg | Freq: Three times a day (TID) | INTRAMUSCULAR | Status: DC | PRN

## 2016-02-08 MED ORDER — DEXAMETHASONE SODIUM PHOSPHATE 10 MG/ML IJ SOLN
INTRAMUSCULAR | Status: DC | PRN
  Administered 2016-02-08: 8 mg via INTRAVENOUS

## 2016-02-08 MED ORDER — NEOMYCIN-POLYMYXIN B GU 40-200000 IR SOLN
Status: AC
  Filled 2016-02-08: qty 4

## 2016-02-08 MED ORDER — ZOLPIDEM TARTRATE 5 MG PO TABS
5.0000 mg | ORAL_TABLET | Freq: Every evening | ORAL | Status: DC | PRN
  Administered 2016-02-08 – 2016-02-10 (×2): 5 mg via ORAL
  Filled 2016-02-08 (×2): qty 1

## 2016-02-08 MED ORDER — DIPHENHYDRAMINE HCL 50 MG/ML IJ SOLN
INTRAMUSCULAR | Status: AC
  Filled 2016-02-08: qty 1

## 2016-02-08 MED ORDER — ONDANSETRON HCL 4 MG PO TABS: 4.0000 mg | ORAL_TABLET | Freq: Four times a day (QID) | ORAL | Status: DC | PRN

## 2016-02-08 MED ORDER — AMLODIPINE BESYLATE 5 MG PO TABS
5.0000 mg | ORAL_TABLET | ORAL | Status: DC
  Administered 2016-02-09 – 2016-02-11 (×3): 5 mg via ORAL
  Filled 2016-02-08 (×3): qty 1

## 2016-02-08 MED ORDER — SODIUM CHLORIDE 0.9 % IV SOLN
INTRAVENOUS | Status: DC | PRN
  Administered 2016-02-08: 60 mL

## 2016-02-08 MED ORDER — ACETAMINOPHEN 650 MG RE SUPP: 650.0000 mg | Freq: Four times a day (QID) | RECTAL | Status: DC | PRN

## 2016-02-08 MED ORDER — POLYETHYLENE GLYCOL 3350 17 GM/SCOOP PO POWD: 17.0000 g | Freq: Every day | ORAL | Status: DC | PRN

## 2016-02-08 MED ORDER — ROCURONIUM BROMIDE 100 MG/10ML IV SOLN
INTRAVENOUS | Status: DC | PRN
  Administered 2016-02-08: 20 mg via INTRAVENOUS
  Administered 2016-02-08: 40 mg via INTRAVENOUS
  Administered 2016-02-08: 10 mg via INTRAVENOUS
  Administered 2016-02-08: 20 mg via INTRAVENOUS
  Administered 2016-02-08: 10 mg via INTRAVENOUS
  Administered 2016-02-08: 20 mg via INTRAVENOUS

## 2016-02-08 MED ORDER — ONDANSETRON HCL 4 MG/2ML IJ SOLN
INTRAMUSCULAR | Status: DC | PRN
  Administered 2016-02-08: 4 mg via INTRAVENOUS

## 2016-02-08 MED ORDER — NEOMYCIN-POLYMYXIN B GU 40-200000 IR SOLN
Status: DC | PRN
  Administered 2016-02-08: 12 mL

## 2016-02-08 MED ORDER — SODIUM CHLORIDE 0.9 % IV SOLN
INTRAVENOUS | Status: DC
  Administered 2016-02-08 (×4): via INTRAVENOUS

## 2016-02-08 MED ORDER — BUPIVACAINE-EPINEPHRINE (PF) 0.5% -1:200000 IJ SOLN
INTRAMUSCULAR | Status: AC
  Filled 2016-02-08: qty 30

## 2016-02-08 MED ORDER — CEFAZOLIN SODIUM-DEXTROSE 2-4 GM/100ML-% IV SOLN
INTRAVENOUS | Status: AC
  Filled 2016-02-08: qty 100

## 2016-02-08 MED ORDER — FENTANYL CITRATE (PF) 100 MCG/2ML IJ SOLN
INTRAMUSCULAR | Status: DC | PRN
  Administered 2016-02-08: 50 ug via INTRAVENOUS
  Administered 2016-02-08: 100 ug via INTRAVENOUS
  Administered 2016-02-08 (×5): 50 ug via INTRAVENOUS

## 2016-02-08 MED ORDER — HYDROCHLOROTHIAZIDE 25 MG PO TABS
25.0000 mg | ORAL_TABLET | Freq: Every day | ORAL | Status: DC
  Administered 2016-02-09 – 2016-02-11 (×3): 25 mg via ORAL
  Filled 2016-02-08 (×3): qty 1

## 2016-02-08 MED ORDER — DOCUSATE SODIUM 100 MG PO CAPS
100.0000 mg | ORAL_CAPSULE | Freq: Two times a day (BID) | ORAL | Status: DC
Start: 2016-02-08 — End: 2016-02-11
  Administered 2016-02-09 – 2016-02-11 (×5): 100 mg via ORAL
  Filled 2016-02-08 (×5): qty 1

## 2016-02-08 MED ORDER — ROSUVASTATIN CALCIUM 5 MG PO TABS
10.0000 mg | ORAL_TABLET | Freq: Every evening | ORAL | Status: DC
  Administered 2016-02-09 – 2016-02-10 (×2): 10 mg via ORAL
  Filled 2016-02-08 (×2): qty 2

## 2016-02-08 MED ORDER — OXYCODONE HCL 5 MG PO TABS: 5.0000 mg | ORAL_TABLET | Freq: Once | ORAL | Status: DC | PRN

## 2016-02-08 MED ORDER — METFORMIN HCL 500 MG PO TABS
500.0000 mg | ORAL_TABLET | Freq: Two times a day (BID) | ORAL | Status: DC
  Administered 2016-02-08 – 2016-02-11 (×4): 500 mg via ORAL
  Filled 2016-02-08 (×4): qty 1

## 2016-02-08 MED ORDER — ACETAMINOPHEN 10 MG/ML IV SOLN
INTRAVENOUS | Status: DC | PRN
  Administered 2016-02-08: 1000 mg via INTRAVENOUS

## 2016-02-08 MED ORDER — RIVAROXABAN 10 MG PO TABS
10.0000 mg | ORAL_TABLET | Freq: Every day | ORAL | Status: DC
  Administered 2016-02-09 – 2016-02-11 (×3): 10 mg via ORAL
  Filled 2016-02-08 (×3): qty 1

## 2016-02-08 SURGICAL SUPPLY — 69 items
ADAPTER BOLT FEMORAL +2/-2 (Knees) ×3 IMPLANT
BANDAGE ACE 6X5 VEL STRL LF (GAUZE/BANDAGES/DRESSINGS) ×3 IMPLANT
BLADE SAW 1 (BLADE) ×3 IMPLANT
BLADE SAW 1/2 (BLADE) ×3 IMPLANT
BONE CEMENT GENTAMICIN (Cement) ×9 IMPLANT
CANISTER SUCT 1200ML W/VALVE (MISCELLANEOUS) ×3 IMPLANT
CANISTER SUCT 3000ML (MISCELLANEOUS) ×6 IMPLANT
CATH FOL LEG HOLDER (MISCELLANEOUS) ×3 IMPLANT
CATH TRAY METER 16FR LF (MISCELLANEOUS) ×3 IMPLANT
CEMENT BONE GENTAMICIN 40 (Cement) ×3 IMPLANT
CHLORAPREP W/TINT 26ML (MISCELLANEOUS) ×6 IMPLANT
COOLER POLAR GLACIER W/PUMP (MISCELLANEOUS) ×3 IMPLANT
COVER TABLE BACK 60X90 (DRAPES) ×3 IMPLANT
CUFF TOURN 30 STER DUAL PORT (MISCELLANEOUS) ×3 IMPLANT
DRAPE SHEET LG 3/4 BI-LAMINATE (DRAPES) ×12 IMPLANT
ELECT CAUTERY BLADE 6.4 (BLADE) ×3 IMPLANT
ELECT REM PT RETURN 9FT ADLT (ELECTROSURGICAL) ×3
ELECTRODE REM PT RTRN 9FT ADLT (ELECTROSURGICAL) ×1 IMPLANT
FEMORAL ADAPTER (Orthopedic Implant) ×3 IMPLANT
FEMORAL PFC TC3 (Orthopedic Implant) ×3 IMPLANT
GAUZE PETRO XEROFOAM 1X8 (MISCELLANEOUS) ×3 IMPLANT
GAUZE SPONGE 4X4 12PLY STRL (GAUZE/BANDAGES/DRESSINGS) ×3 IMPLANT
GLOVE BIOGEL PI IND STRL 9 (GLOVE) ×3 IMPLANT
GLOVE BIOGEL PI INDICATOR 9 (GLOVE) ×6
GLOVE INDICATOR 8.0 STRL GRN (GLOVE) ×12 IMPLANT
GLOVE SURG ORTHO 8.0 STRL STRW (GLOVE) ×12 IMPLANT
GLOVE SURG SYN 9.0  PF PI (GLOVE) ×6
GLOVE SURG SYN 9.0 PF PI (GLOVE) ×3 IMPLANT
GOWN SRG 2XL LVL 4 RGLN SLV (GOWNS) ×1 IMPLANT
GOWN STRL NON-REIN 2XL LVL4 (GOWNS) ×2
GOWN STRL REUS W/ TWL LRG LVL3 (GOWN DISPOSABLE) ×1 IMPLANT
GOWN STRL REUS W/ TWL XL LVL3 (GOWN DISPOSABLE) ×1 IMPLANT
GOWN STRL REUS W/TWL LRG LVL3 (GOWN DISPOSABLE) ×2
GOWN STRL REUS W/TWL XL LVL3 (GOWN DISPOSABLE) ×2
HANDLE YANKAUER SUCT BULB TIP (MISCELLANEOUS) ×6 IMPLANT
HANDPIECE INTERPULSE COAX TIP (DISPOSABLE) ×2
HOOD PEEL AWAY FLYTE STAYCOOL (MISCELLANEOUS) ×6 IMPLANT
IMMBOLIZER KNEE 19 BLUE UNIV (SOFTGOODS) ×3 IMPLANT
INSERT TIBIAL TC3 RP SZ 2.5 (Knees) ×3 IMPLANT
KIT PREVENA INCISION MGT20CM45 (CANNISTER) ×3 IMPLANT
KIT RM TURNOVER STRD PROC AR (KITS) ×3 IMPLANT
KNIFE SCULPS 14X20 (INSTRUMENTS) ×3 IMPLANT
NEEDLE SPNL 18GX3.5 QUINCKE PK (NEEDLE) ×3 IMPLANT
NEEDLE SPNL 20GX3.5 QUINCKE YW (NEEDLE) ×3 IMPLANT
NS IRRIG 1000ML POUR BTL (IV SOLUTION) ×3 IMPLANT
PACK TOTAL KNEE (MISCELLANEOUS) ×3 IMPLANT
PAD WRAPON POLAR KNEE (MISCELLANEOUS) ×1 IMPLANT
PATELLA DOME PFC 35MM (Knees) ×3 IMPLANT
SET HNDPC FAN SPRY TIP SCT (DISPOSABLE) ×1 IMPLANT
SLEEVE MBT PROUS ML 29MM (Knees) ×3 IMPLANT
SLEEVE UNIV FEM DIST PRO SZ 31 (Sleeve) ×3 IMPLANT
SOL .9 NS 3000ML IRR  AL (IV SOLUTION) ×2
SOL .9 NS 3000ML IRR UROMATIC (IV SOLUTION) ×1 IMPLANT
SPONGE LAP 18X18 5 PK (GAUZE/BANDAGES/DRESSINGS) ×3 IMPLANT
STAPLER SKIN PROX 35W (STAPLE) ×3 IMPLANT
STEM UNIVERSAL REVISION 75X14 (Stem) ×6 IMPLANT
SUCTION FRAZIER HANDLE 10FR (MISCELLANEOUS) ×2
SUCTION TUBE FRAZIER 10FR DISP (MISCELLANEOUS) ×1 IMPLANT
SUT DVC 2 QUILL PDO  T11 36X36 (SUTURE) ×2
SUT DVC 2 QUILL PDO T11 36X36 (SUTURE) ×1 IMPLANT
SUT DVC QUILL MONODERM 30X30 (SUTURE) ×3 IMPLANT
SYR 20CC LL (SYRINGE) ×3 IMPLANT
SYR 50ML LL SCALE MARK (SYRINGE) ×6 IMPLANT
TOWEL OR 17X26 4PK STRL BLUE (TOWEL DISPOSABLE) ×3 IMPLANT
TOWER CARTRIDGE SMART MIX (DISPOSABLE) ×3 IMPLANT
TRAY TIB SZ 2 REVISION (Knees) ×3 IMPLANT
TUBING CONNECTING 10 (TUBING) ×2 IMPLANT
TUBING CONNECTING 10' (TUBING) ×1
WRAPON POLAR PAD KNEE (MISCELLANEOUS) ×3

## 2016-02-08 NOTE — H&P (Signed)
Reviewed paper H+P, will be scanned into chart. No changes noted.  

## 2016-02-08 NOTE — Evaluation (Signed)
Physical Therapy Evaluation Patient Details Name: Patty Leon MRN: [id number] DOB: 31-Oct-1960 Today's Date: 02/08/2016   History of Present Illness  55 y/o female here for L total knee revision.  She had the initial replacement a few months ago this summer.  Clinical Impression  Pt did very well with POD0 PT exam.  She was able to participate well with ~10 minutes of exercises and showed good AROM with hip ab/ad, SAQ, and had good quad set engagement.  She was also able to go supine<>sit with only CGA and cuing.  Pt with no safety issues in standing and showed good effort with side to side and fwd/back stepping.  Overall pt did well regarding post TKA revision expectations for day of surgery.      Follow Up Recommendations Home health PT    Equipment Recommendations       Recommendations for Other Services       Precautions / Restrictions Precautions Precautions: Fall Restrictions Weight Bearing Restrictions: Yes LLE Weight Bearing: Weight bearing as tolerated      Mobility  Bed Mobility Overal bed mobility: Modified Independent             General bed mobility comments: Pt is able to slide L LE off EOB and slowly get to sitting w/o assist.  She is also able to use R LE to lift L LE back into bed and showed great determination  Transfers Overall transfer level: Modified independent Equipment used: Rolling walker (2 wheeled)             General transfer comment: Pt was able to rise to standing w/o direct assist, she did well with the effort and overall showed a lot of good effort.   Ambulation/Gait Ambulation/Gait assistance: Min guard Ambulation Distance (Feet): 6 Feet Assistive device: Rolling walker (2 wheeled)       General Gait Details: Pt able to take multiple sides steps each way along EOB and also took 3 steps forward and back.  She had no buckling in the L knee and overall did well with safety and relative confidence  Stairs             Wheelchair Mobility    Modified Rankin (Stroke Patients Only)       Balance Overall balance assessment: Modified Independent                                           Pertinent Vitals/Pain Pain Assessment: 0-10 Pain Score: 8  Pain Location: L knee    Home Living Family/patient expects to be discharged to:: Private residence Living Arrangements: Spouse/significant other     Home Access: Ramped entrance       [place]: Walker - 2 wheels      Prior Function Level of Independence: Independent         Comments: Pt was independent until before TKA this summer, has not been able to get off the [facility] since that time     Hand Dominance        Extremity/Trunk Assessment   Upper Extremity Assessment: Overall WFL for tasks assessed           Lower Extremity Assessment: Overall WFL for tasks assessed         Communication   Communication: No difficulties  Cognition Arousal/Alertness: Awake/alert Behavior During Therapy: WFL for tasks assessed/performed Overall Cognitive Status: Within  Functional Limits for tasks assessed                      General Comments      Exercises Total Joint Exercises Ankle Circles/Pumps: Left;10 reps;AROM Quad Sets: Strengthening;10 reps Gluteal Sets: Strengthening;10 reps Short Arc Quad: AROM;10 reps Heel Slides: 10 reps;AAROM Hip ABduction/ADduction: Strengthening;10 reps Straight Leg Raises: PROM;5 reps (no AROM SLR) Knee Flexion: PROM;5 reps Goniometric ROM: 1-62   Assessment/Plan    PT Assessment Patient needs continued PT services  PT Problem List Decreased strength          PT Treatment Interventions DME instruction;Gait training;Functional mobility training;Therapeutic activities;Therapeutic exercise;Balance training;Neuromuscular re-education;Patient/family education    PT Goals (Current goals can be found in the Care Plan section)  Acute Rehab PT Goals Patient Stated  Goal: get this knee working again PT Goal Formulation: With patient Time For Goal Achievement: 02/22/16 Potential to Achieve Goals: Good    Frequency BID   Barriers to discharge        Co-evaluation               End of Session Equipment Utilized During Treatment: Gait belt Activity Tolerance: Patient limited by pain;Patient limited by fatigue Patient left: with bed alarm set;with family/visitor present;with call bell/phone within reach           Time: [id number] PT Time Calculation (min) (ACUTE ONLY): 29 min   Charges:   PT Evaluation $PT Eval Low Complexity: 1 Procedure PT Treatments $Therapeutic Exercise: 8-22 mins   PT G Codes:        Kreg Shropshire, DPT 02/08/2016, 5:23 PM

## 2016-02-08 NOTE — Progress Notes (Signed)
Patient has 2 inhalers that are from a trial, no name on them, will make anesthesia aware.

## 2016-02-08 NOTE — Anesthesia Preprocedure Evaluation (Signed)
Anesthesia Evaluation  Patient identified by MRN, date of birth, ID band Patient awake    Reviewed: Allergy & Precautions, H&P , NPO status , Patient's Chart, lab work & pertinent test results  History of Anesthesia Complications Negative for: history of anesthetic complications  Airway Mallampati: III  TM Distance: >3 FB Neck ROM: full    Dental no notable dental hx. (+) Poor Dentition, Missing, Upper Dentures   Pulmonary shortness of breath and with exertion, sleep apnea , COPD,  COPD inhaler, Current Smoker,    Pulmonary exam normal breath sounds clear to auscultation       Cardiovascular Exercise Tolerance: Poor hypertension, (-) anginaNormal cardiovascular exam Rhythm:regular Rate:Normal     Neuro/Psych PSYCHIATRIC DISORDERS Anxiety negative neurological ROS     GI/Hepatic Neg liver ROS, GERD  Controlled,  Endo/Other  diabetes, Type 2  Renal/GU      Musculoskeletal  (+) Arthritis ,   Abdominal   Peds  Hematology negative hematology ROS (+)   Anesthesia Other Findings Past Medical History: No date: Anxiety No date: Arthritis No date: COPD (chronic obstructive pulmonary disease) (* No date: Diabetes mellitus without complication (HCC) No date: Diverticulosis No date: Fluid retention No date: GERD (gastroesophageal reflux disease) No date: Hypercholesteremia No date: Hypertension No date: Shortness of breath dyspnea No date: Sleep apnea     Comment: Use C-PAP with oxygen  Past Surgical History: No date: BILATERAL CARPAL TUNNEL RELEASE No date: COLONOSCOPY 01/22/2015: COLONOSCOPY N/A     Comment: Procedure: COLONOSCOPY;  Surgeon: Josefine Class, MD;  Location: [facility] ENDOSCOPY;                Service: Endoscopy;  Laterality: N/A; 09/2015: JOINT REPLACEMENT Left No date: KNEE ARTHROSCOPY Left 11/04/2015: KNEE CLOSED REDUCTION Left     Comment: Procedure: CLOSED MANIPULATION KNEE;   Surgeon:              Hessie Knows, MD;  Location: [facility] ORS;                Service: Orthopedics;  Laterality: Left; No date: SHOULDER ARTHROSCOPY W/ ROTATOR CUFF REPAIR Right 10/07/2015: TOTAL KNEE ARTHROPLASTY Left     Comment: Procedure: TOTAL KNEE ARTHROPLASTY;  Surgeon:               Hessie Knows, MD;  Location: [facility] ORS;                Service: Orthopedics;  Laterality: Left; No date: TUBAL LIGATION     Reproductive/Obstetrics negative OB ROS                             Anesthesia Physical Anesthesia Plan  ASA: III  Anesthesia Plan: Spinal   Post-op Pain Management:    Induction:   Airway Management Planned:   Additional Equipment:   Intra-op Plan:   Post-operative Plan:   Informed Consent: I have reviewed the patients History and Physical, chart, labs and discussed the procedure including the risks, benefits and alternatives for the proposed anesthesia with the patient or authorized representative who has indicated his/her understanding and acceptance.   Dental Advisory Given  Plan Discussed with: Anesthesiologist, CRNA and Surgeon  Anesthesia Plan Comments: (Patient reports that last xarelto dose was greater than 3 days ago)        Anesthesia Quick Evaluation

## 2016-02-08 NOTE — NC FL2 (Signed)
[place] LEVEL OF CARE SCREENING TOOL     IDENTIFICATION  Patient Name: Patty Leon Birthdate: [birth year] Sex: female Admission Date (Current Location): 02/08/2016  [facility] and [place] Number:  Patty Leon  ([id number]) Facility and Address:  [facility], [address], [place], [place] [address]      Provider Number: [id number]  Attending Physician Name and Address:  Hessie Knows, MD  Relative Name and Phone Number:       Current Level of Care: Hospital Recommended Level of Care: [place] Prior Approval Number:    Date Approved/Denied:   PASRR Number:  ([id number])  Discharge Plan: SNF    Current Diagnoses: Patient Active Problem List   Diagnosis Date Noted  . 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 Primary localized osteoarthritis of left knee 10/07/2015    Orientation RESPIRATION BLADDER Height & Weight     Self, Time, Situation, Place  O2 (Nasal Cannula: 3L/min) External catheter Weight:   Height:     BEHAVIORAL SYMPTOMS/MOOD NEUROLOGICAL BOWEL NUTRITION STATUS   (None. )  (None.) Continent Diet (Diet: Clear Liquid)  AMBULATORY STATUS COMMUNICATION OF NEEDS Skin   Extensive Assist Verbally Surgical wounds (Incision: Left Knee)                       Personal Care Assistance Level of Assistance  Bathing, Feeding, Dressing Bathing Assistance: Limited assistance Feeding assistance: Independent Dressing Assistance: Limited assistance     Functional Limitations Info  Sight, Hearing, Speech Sight Info: Adequate Hearing Info: Adequate Speech Info: Adequate    SPECIAL CARE FACTORS FREQUENCY  PT (By licensed PT), OT (By licensed OT)     PT Frequency:  (5) OT Frequency:  (5)            Contractures      Additional Factors Info  Code Status, Allergies, Insulin Sliding Scale Code Status Info:  (Full Code) Allergies Info:  (Celebrex Celecoxib)   Insulin Sliding Scale Info:   (NovoLog)       Current Medications (02/08/2016):  This is the current hospital active medication list Current Facility-Administered Medications  Medication Dose Route Frequency Provider Last Rate Last Dose  . 0.9 %  sodium chloride infusion   Intravenous Continuous Hessie Knows, MD      . acetaminophen (TYLENOL) tablet 650 mg  650 mg Oral Q6H PRN Hessie Knows, MD       Or  . acetaminophen (TYLENOL) suppository 650 mg  650 mg Rectal Q6H PRN Hessie Knows, MD      . Derrill Memo ON 02/09/2016] amLODipine (NORVASC) tablet 5 mg  5 mg Oral Tedra Senegal, MD      . bisacodyl (DULCOLAX) suppository 10 mg  10 mg Rectal Daily PRN Hessie Knows, MD      . ceFAZolin (ANCEF) IVPB 2g/100 mL premix  2 g Intravenous Q6H Hessie Knows, MD      . diphenhydrAMINE (BENADRYL) 50 MG/ML injection           . docusate sodium (COLACE) capsule 100 mg  100 mg Oral Daily Hessie Knows, MD      . docusate sodium (COLACE) capsule 100 mg  100 mg Oral BID Hessie Knows, MD      . hydrochlorothiazide (HYDRODIURIL) tablet 25 mg  25 mg Oral Daily Hessie Knows, MD      . insulin aspart (novoLOG) injection 0-15 Units  0-15 Units Subcutaneous TID WC Hessie Knows, MD      .  magnesium citrate solution 1 Bottle  1 Bottle Oral Once PRN Hessie Knows, MD      . magnesium hydroxide (MILK OF MAGNESIA) suspension 30 mL  30 mL Oral Daily PRN Hessie Knows, MD      . menthol-cetylpyridinium (CEPACOL) lozenge 3 mg  1 lozenge Oral PRN Hessie Knows, MD       Or  . phenol (CHLORASEPTIC) mouth spray 1 spray  1 spray Mouth/Throat PRN Hessie Knows, MD      . metFORMIN (GLUCOPHAGE) tablet 500 mg  500 mg Oral BID Hessie Knows, MD      . methocarbamol (ROBAXIN) tablet 500 mg  500 mg Oral Q6H PRN Hessie Knows, MD       Or  . methocarbamol (ROBAXIN) 500 mg in dextrose 5 % 50 mL IVPB  500 mg Intravenous Q6H PRN Hessie Knows, MD      . metoCLOPramide (REGLAN) tablet 5-10 mg  5-10 mg Oral Q8H PRN Hessie Knows, MD       Or  . metoCLOPramide (REGLAN)  injection 5-10 mg  5-10 mg Intravenous Q8H PRN Hessie Knows, MD      . Derrill Memo ON 02/09/2016] metoprolol (LOPRESSOR) tablet 50 mg  50 mg Oral Tedra Senegal, MD      . morphine 4 MG/ML injection 4 mg  4 mg Intravenous Q2H PRN Hessie Knows, MD      . NON FORMULARY 2 puff  2 puff Oral BID Hessie Knows, MD      . NON FORMULARY 2 puff  2 puff Inhalation Q4H PRN Hessie Knows, MD      . ondansetron [facility]) tablet 4 mg  4 mg Oral Q6H PRN Hessie Knows, MD       Or  . ondansetron [facility]) injection 4 mg  4 mg Intravenous Q6H PRN Hessie Knows, MD      . oxyCODONE (Oxy IR/ROXICODONE) immediate release tablet 15 mg  15 mg Oral Q4H PRN Hessie Knows, MD      . pantoprazole (PROTONIX) EC tablet 80 mg  80 mg Oral Daily Hessie Knows, MD      . polyethylene glycol powder (GLYCOLAX/MIRALAX) container 17 g  17 g Oral Daily PRN Hessie Knows, MD      . Derrill Memo ON 02/09/2016] rivaroxaban (XARELTO) tablet 10 mg  10 mg Oral Q breakfast Hessie Knows, MD      . Derrill Memo ON 02/09/2016] roflumilast (DALIRESP) tablet 500 mcg  500 mcg Oral Tedra Senegal, MD      . rosuvastatin (CRESTOR) tablet 10 mg  10 mg Oral QPM Hessie Knows, MD      . Vitamin D CAPS 2,000 Units  2,000 Units Oral Daily Hessie Knows, MD      . zolpidem [facility]) tablet 5 mg  5 mg Oral QHS PRN Hessie Knows, MD         Discharge Medications: Please see discharge summary for a list of discharge medications.  Relevant Imaging Results:  Relevant Lab Results:   Additional Information  (SSN: [id number])  Danie Chandler, Student-Social Work

## 2016-02-08 NOTE — Transfer of Care (Signed)
Immediate Anesthesia Transfer of Care Note  Patient: Patty Leon  Procedure(s) Performed: Procedure(s): TOTAL KNEE REVISION (Left)  Patient Location: PACU  Anesthesia Type:General  Level of Consciousness: sedated  Airway & Oxygen Therapy: Patient Spontanous Breathing and Patient connected to face mask oxygen  Post-op Assessment: Report given to RN and Post -op Vital signs reviewed and stable  Post vital signs: Reviewed and stable  Last Vitals:  Vitals:   02/08/16 0611  BP: 104/88  Pulse: 84  Resp: 16  Temp: 37.1 C    Last Pain:  Vitals:   02/08/16 0611  TempSrc: Oral  PainSc: 4          Complications: No apparent anesthesia complications

## 2016-02-08 NOTE — Op Note (Signed)
02/08/2016  11:12 AM  PATIENT:  Patty Leon  55 y.o. female  PRE-OPERATIVE DIAGNOSIS:  PAIN DUE TO TOTAL KNEE REPLACEMENT  POST-OPERATIVE DIAGNOSIS:  PAIN DUE TO TOTAL KNEE REPLACEMENT  PROCEDURE:  Procedure(s): TOTAL KNEE REVISION (Left)  All components  SURGEON: Laurene Footman, MD  ASSISTANTS:  none  ANESTHESIA:   general  EBL:  Total I/O In: 2000 [I.V.:2000] Out: 650 [Urine:150; Blood:500]  BLOOD ADMINISTERED:none  DRAINS: none   LOCAL MEDICATIONS USED:  MARCAINE    and OTHER Exparel  SPECIMEN:  No Specimen  DISPOSITION OF SPECIMEN:  N/A  COUNTS:  YES  TOURNIQUET:  81 min 300 mg mercury  IMPLANTS: To PACU MBT revision metaphyseal sleeve 28 mm with Sigma femoral TC 3 left 2.5 femoral component with +2 adapter and 5 adapter. A 31 mm femoral sleeve 20 mm on the tibial side a 75 x 14 mm fluted stem 2 of the MBT revision size 2 cemented tibial tray with a 15 mm insert. And a 3 peg 35 mm patella all components cemented with antibiotic cement  DICTATION: .Dragon Dictation patient brought the operating room and after adequate anesthesia was obtained, with a general anesthetic with failure of spinal anesthesia the left leg is prepped draped in sterile fashion tourniquet by the upper thigh. After patient identification and timeout procedures were completed, a midline skin incision was made followed by medial parapatellar arthrotomy. Hemostasis achieved with electrocautery throughout the case. There is extensive scarring around the joint. This was released to allow for access to gutters and repeat mobilization of the patella scar was removed on the posterior aspect of the patellar tendon but the tendon itself was not elevated during the procedure. The screw out for that set the polyethylene component in place was removed and the polyethylene component removed with a thin flexible osteotome the cement was separated from the metal on the femoral side first and the femoral component  removed without difficulty followed by identical procedure on the tibia with most the cement being left in place cement removal equipment was then used to remove the cement from the tibial tunnel and tibial preparation was carried out with a hand reaming followed by proximal tibia cut and then broaching for the sleeve Trout with the trial placed and fit well and this was chosen for those components were chosen. The distal femur was approached with a hand rongeur to open up the distal femur followed by hand reaming and a distal femoral freshen up cut 2.5 cutting block applied anterior posterior and chamfer cuts made as well as the notch cut for the revision system. Broaching was carried out for the TC 3 system with a trials placed with the femoral and tibial component of the 15 mm insert gave the best stability throughout a range of motion. After getting all these components set the patella was examined and noted to be loose as well and was removed and patella a saw used to remove the cement 3 joint holes made through the template and 35 mm dome patella chosen the femoral and tibial components were cemented into place after thoroughly irrigating the knee and after having injected the above local the tourniquet was raised prior to this the knee was held in extension with a 15 mm trial and then secondarily the patella clamped with a second batch of cement after all the cement had set the final component was inserted and the knee was stable to range of motion with full extension and flexion 220  the wound was thoroughly irrigated with pulsatile lavage with a dilute Betadine solution the arthrotomy was repaired using a heavy Tycron to repair the medial capsule followed by a heavy Quill to close the entire capsule,3-0 V-loc for subcutaneous closure and then skin staples. Provena incisional wound VAC applied  PLAN OF CARE: Admit to inpatient   PATIENT DISPOSITION:  PACU - hemodynamically stable.

## 2016-02-08 NOTE — Anesthesia Procedure Notes (Signed)
Procedure Name: Intubation Date/Time: 02/08/2016 7:45 AM Performed by: Nelda Marseille Pre-anesthesia Checklist: Patient identified, Patient being monitored, Timeout performed, Emergency Drugs available and Suction available Patient Re-evaluated:Patient Re-evaluated prior to inductionOxygen Delivery Method: Circle system utilized Preoxygenation: Pre-oxygenation with 100% oxygen Intubation Type: IV induction Ventilation: Mask ventilation without difficulty Laryngoscope Size: Mac and 3 Grade View: Grade I Tube type: Oral Tube size: 7.0 mm Number of attempts: 1 Airway Equipment and Method: Stylet Placement Confirmation: ETT inserted through vocal cords under direct vision,  positive ETCO2 and breath sounds checked- equal and bilateral Secured at: 21 cm Tube secured with: Tape Dental Injury: Teeth and Oropharynx as per pre-operative assessment

## 2016-02-08 NOTE — Anesthesia Postprocedure Evaluation (Signed)
Anesthesia Post Note  Patient: NIEMAH DEARMAN  Procedure(s) Performed: Procedure(s) (LRB): TOTAL KNEE REVISION (Left)  Patient location during evaluation: PACU Anesthesia Type: General Level of consciousness: awake and alert Pain management: pain level controlled Vital Signs Assessment: post-procedure vital signs reviewed and stable Respiratory status: spontaneous breathing, nonlabored ventilation, respiratory function stable and patient connected to nasal cannula oxygen Cardiovascular status: blood pressure returned to baseline and stable Postop Assessment: no signs of nausea or vomiting Anesthetic complications: no    Last Vitals:  Vitals:   02/08/16 1318 02/08/16 1421  BP: (!) 143/91 131/71  Pulse:  (!) 106  Resp:  17  Temp:  36.3 C    Last Pain:  Vitals:   02/08/16 1421  TempSrc: Oral  PainSc:                  Precious Haws Jovani Colquhoun

## 2016-02-08 NOTE — Progress Notes (Signed)
Called lab for a 2nd time for pt draw.  States someone Is coming.

## 2016-02-09 LAB — GLUCOSE, CAPILLARY
GLUCOSE-CAPILLARY: 127 mg/dL — AB (ref 65–99)
GLUCOSE-CAPILLARY: 138 mg/dL — AB (ref 65–99)
GLUCOSE-CAPILLARY: 146 mg/dL — AB (ref 65–99)
Glucose-Capillary: 125 mg/dL — ABNORMAL HIGH (ref 65–99)

## 2016-02-09 LAB — CBC
HCT: 35.2 % (ref 35.0–47.0)
Hemoglobin: 11.7 g/dL — ABNORMAL LOW (ref 12.0–16.0)
MCH: 28.7 pg (ref 26.0–34.0)
MCHC: 33.3 g/dL (ref 32.0–36.0)
MCV: 86.1 fL (ref 80.0–100.0)
PLATELETS: 208 10*3/uL (ref 150–440)
RBC: 4.08 MIL/uL (ref 3.80–5.20)
RDW: 15.6 % — ABNORMAL HIGH (ref 11.5–14.5)
WBC: 9.2 10*3/uL (ref 3.6–11.0)

## 2016-02-09 LAB — BASIC METABOLIC PANEL
ANION GAP: 3 — AB (ref 5–15)
BUN: 8 mg/dL (ref 6–20)
CO2: 38 mmol/L — ABNORMAL HIGH (ref 22–32)
Calcium: 8.6 mg/dL — ABNORMAL LOW (ref 8.9–10.3)
Chloride: 97 mmol/L — ABNORMAL LOW (ref 101–111)
Creatinine, Ser: 0.47 mg/dL (ref 0.44–1.00)
Glucose, Bld: 153 mg/dL — ABNORMAL HIGH (ref 65–99)
POTASSIUM: 3.7 mmol/L (ref 3.5–5.1)
SODIUM: 138 mmol/L (ref 135–145)

## 2016-02-09 MED ORDER — TRAMADOL HCL 50 MG PO TABS
50.0000 mg | ORAL_TABLET | Freq: Four times a day (QID) | ORAL | Status: DC | PRN
  Administered 2016-02-09 – 2016-02-10 (×3): 50 mg via ORAL
  Filled 2016-02-09 (×3): qty 1

## 2016-02-09 MED ORDER — HOME MED STORE IN PYXIS
2.0000 | Freq: Two times a day (BID) | Status: DC
  Administered 2016-02-09 – 2016-02-11 (×4): 2 via RESPIRATORY_TRACT
  Filled 2016-02-09: qty 1

## 2016-02-09 MED ORDER — HOME MED STORE IN PYXIS
2.0000 | Status: DC | PRN
  Filled 2016-02-09: qty 1

## 2016-02-09 NOTE — Progress Notes (Signed)
Physical Therapy Treatment Patient Details Name: Patty Leon MRN: [id number] DOB: [birth year] Today's Date: 02/09/2016    History of Present Illness 55 y/o female here for L total knee revision.  She had the initial replacement a few months ago this summer.    PT Comments    Pt presents with deficits in strength, transfers, mobility, gait, balance, and activity tolerance.    Pt required min A with bed mobility to move LLE in/out of bed and requires extensive amount of time to perform all bed mobility tasks during training secondary to LLE pain.  Extra time and effort required during transfers with CGA.  Pt able to ambulate 100' with RW and CGA along with several shorter distances this session with pt reporting decreased L knee pain when up and walking.  Pt will benefit from PT services to address above deficits for decreased caregiver assistance upon discharge.    Follow Up Recommendations  Home health PT     Equipment Recommendations       Recommendations for Other Services       Precautions / Restrictions Precautions Precautions: Fall Required Braces or Orthoses: Knee Immobilizer - Left Knee Immobilizer - Left: On when out of bed or walking Restrictions Weight Bearing Restrictions: Yes LLE Weight Bearing: Weight bearing as tolerated    Mobility  Bed Mobility Overal bed mobility: Needs Assistance Bed Mobility: Supine to Sit;Sit to Supine     Supine to sit: Min assist Sit to supine: Min assist   General bed mobility comments: Pt required min A to move LLE in/out of bed  Transfers Overall transfer level: Needs assistance Equipment used: Rolling walker (2 wheeled) Transfers: Sit to/from Stand Sit to Stand: Min guard         General transfer comment: Min verbal cues with transfers for sequencing with KI donned to LLE  Ambulation/Gait Ambulation/Gait assistance: Min guard Ambulation Distance (Feet): 100 Feet Assistive device: Rolling walker (2 wheeled) Gait  Pattern/deviations: Step-through pattern;Decreased step length - right;Antalgic;Trunk flexed   Gait velocity interpretation: Below normal speed for age/gender General Gait Details: Pt eager to amb and required min verbal cues for sequencing with RW for safety, antalgic on LLE, KI donned to LLE   Stairs            Wheelchair Mobility    Modified Rankin (Stroke Patients Only)       Balance Overall balance assessment: Needs assistance Sitting-balance support: No upper extremity supported Sitting balance-Leahy Scale: Normal     Standing balance support: Bilateral upper extremity supported Standing balance-Leahy Scale: Good                      Cognition Arousal/Alertness: Awake/alert Behavior During Therapy: WFL for tasks assessed/performed Overall Cognitive Status: Within Functional Limits for tasks assessed                      Exercises Total Joint Exercises Ankle Circles/Pumps: AROM;Both;10 reps;15 reps Quad Sets: AROM;Left;10 reps;15 reps Gluteal Sets: AROM;Both;10 reps Heel Slides: AAROM;Left;10 reps Hip ABduction/ADduction: AAROM;Left;10 reps Straight Leg Raises: AAROM;Left;10 reps;15 reps Long Arc Quad: AROM;AAROM;Left;10 reps;15 reps Knee Flexion: AROM;AAROM;Left;10 reps;15 reps Goniometric ROM: L knee A/AAROM:  ext -5/-3 deg; flex 60/64 deg but limited by pain Other Exercises Other Exercises: HEP review for L knee QS and LAQ, knee replacement exercises reviewed per handout    General Comments        Pertinent Vitals/Pain Pain Assessment: 0-10 Pain Score: 10-Worst pain ever  Pain Location: L knee Pain Descriptors / Indicators: Aching;Constant (Improved during session) Pain Intervention(s): Monitored during session;Limited activity within patient's tolerance;Premedicated before session    Home Living                      Prior Function            PT Goals (current goals can now be found in the care plan section) Progress  towards PT goals: Progressing toward goals    Frequency    BID      PT Plan Current plan remains appropriate    Co-evaluation             End of Session Equipment Utilized During Treatment: Gait belt;Oxygen Activity Tolerance: Patient tolerated treatment well Patient left: in bed;with bed alarm set;with call bell/phone within reach (L foot in bone foam, R heel floated, polar care donned)     Time: [id number] PT Time Calculation (min) (ACUTE ONLY): 56 min  Charges:  $Gait Training: 8-22 mins $Therapeutic Exercise: 23-37 mins $Therapeutic Activity: 8-22 mins                    G Codes:      DRoyetta Asal PT, DPT 02/09/16, 5:06 PM

## 2016-02-09 NOTE — Progress Notes (Signed)
Subjective: 1 Day Post-Op Procedure(s) (LRB): TOTAL KNEE REVISION (Left) Patient reports pain as 10 on 0-10 scale.   Patient is well, but has had some minor complaints of pain. Plan is to go Skilled nursing facility after hospital stay. Negative for chest pain and shortness of breath Fever: no Gastrointestinal:Negative for nausea and vomiting Pt using CPAP machine upon arrival this AM.  Objective: Vital signs in last 24 hours: Temp:  [97.4 F (36.3 C)-99.1 F (37.3 C)] 98.3 F (36.8 C) (11/29 0746) Pulse Rate:  [86-112] 88 (11/29 0746) Resp:  [13-28] 19 (11/29 0746) BP: (104-151)/(51-100) 110/57 (11/29 0746) SpO2:  [90 %-100 %] 92 % (11/29 0746) FiO2 (%):  [28 %] 28 % (11/28 1338) Weight:  [123.3 kg (271 lb 12.8 oz)] 123.3 kg (271 lb 12.8 oz) (11/28 1518)  Intake/Output from previous day:  Intake/Output Summary (Last 24 hours) at 02/09/16 0753 Last data filed at 02/09/16 0500  Gross per 24 hour  Intake             2700 ml  Output             3180 ml  Net             -480 ml    Intake/Output this shift: No intake/output data recorded.  Labs:  Recent Labs  02/09/16 0349  HGB 11.7*    Recent Labs  02/09/16 0349  WBC 9.2  RBC 4.08  HCT 35.2  PLT 208    Recent Labs  02/09/16 0349  NA 138  K 3.7  CL 97*  CO2 38*  BUN 8  CREATININE 0.47  GLUCOSE 153*  CALCIUM 8.6*    Recent Labs  02/08/16 0626  INR 0.81     EXAM General - Patient is Alert, Appropriate and Oriented Extremity - ABD soft Neurovascular intact Dorsiflexion/Plantar flexion intact Incision: Woundvac in place with moderate bloody drainage. No cellulitis present Dressing/Incision - blood tinged drainage, Woundvac in place with appropriate seal. Motor Function - intact, moving foot and toes well on exam.   Past Medical History:  Diagnosis Date  . Anxiety   . Arthritis   . COPD (chronic obstructive pulmonary disease) ([place])   . Diabetes mellitus without complication ([place])   .  Diverticulosis   . Fluid retention   . GERD (gastroesophageal reflux disease)   . Hypercholesteremia   . Hypertension   . Shortness of breath dyspnea   . Sleep apnea    Use C-PAP with oxygen    Assessment/Plan: 1 Day Post-Op Procedure(s) (LRB): TOTAL KNEE REVISION (Left) Active Problems:   Failed total knee arthroplasty (HCC)  Estimated body mass index is 49.71 kg/m as calculated from the following:   Height as of this encounter: 5\' 2"  (1.575 m).   Weight as of this encounter: 123.3 kg (271 lb 12.8 oz). Advance diet Up with therapy D/C IV fluids when tolerating PO intake.  Labs reviewed, Hg stable at 11.7.  CBC and BMP ordered for tomorrow morning. Foley to be removed today. Pt reports 10 out of 10 knee pain, Pt currenly receiving oxycodone 15mg  every 4 hours.  Will add on tramadol at this time.  DVT Prophylaxis - Foot Pumps, TED hose and Xarelto Weight-Bearing as tolerated to left leg  J. Cameron Proud, PA-C [facility] 02/09/2016, 7:53 AM

## 2016-02-09 NOTE — Progress Notes (Signed)
Patient refused CPM machine when RN offered to put it on

## 2016-02-09 NOTE — Progress Notes (Signed)
Clinical Social Worker (CSW) received SNF consult. PT is recommending home health. RN case manager is aware of above. Please reconsult if future social work needs arise. CSW signing off.   Prince Couey, LCSW [phone] 

## 2016-02-09 NOTE — Progress Notes (Signed)
Patient's home study inhalers labeled by pharmacy and delivered to floor.  Lenis Noon, PharmD Clinical Pharmacist 02/09/16 6:21 PM

## 2016-02-09 NOTE — Progress Notes (Signed)
Patient refused CPM when offered to put it on by RN.

## 2016-02-09 NOTE — Progress Notes (Signed)
Pt in bed on CPM at 0630 at 0-35, this is what the patient can tolerate.

## 2016-02-09 NOTE — Progress Notes (Signed)
Physical Therapy Treatment Patient Details Name: Patty Leon MRN: [id number] DOB: March 12, 1961 Today's Date: 02/09/2016    History of Present Illness 55 y/o female here for L total knee revision.  She had the initial replacement a few months ago this summer.    PT Comments    Pt was Mod I with bed mobility with use of rails, trapeze bar, and RLE to assist LLE in/out of bed.  Pt eager to amb and participate in therapy but c/o 10/10 L knee pain at rest.  No increase in pain with activity but unable to measure knee flex ROM this session secondary to pain.  Pt ambulated several short distances of 5-10' then progressed to 57' with no increase in pain.  KI donned to LLE during standing activities as pt is unable to perform ind LLE SLR at this time.  Pt will benefit from PT services to address deficits in strength, L knee ROM, gait, and mobility for decreased caregiver assistance upon discharge.   Follow Up Recommendations  Home health PT     Equipment Recommendations       Recommendations for Other Services       Precautions / Restrictions Precautions Precautions: Fall Required Braces or Orthoses: Knee Immobilizer - Left Knee Immobilizer - Left: On when out of bed or walking Restrictions Weight Bearing Restrictions: Yes LLE Weight Bearing: Weight bearing as tolerated    Mobility  Bed Mobility Overal bed mobility: Modified Independent             General bed mobility comments: Pt able to use RLE to assist LLE during bed mobility, used trapeze bar for scooting to [facility].   Transfers Overall transfer level: Needs assistance Equipment used: Rolling walker (2 wheeled) Transfers: Sit to/from Stand Sit to Stand: Min guard         General transfer comment: Min verbal cues with transfers for sequencing with KI donned to LLE  Ambulation/Gait Ambulation/Gait assistance: Min guard Ambulation Distance (Feet): 30 Feet Assistive device: Rolling walker (2 wheeled) Gait  Pattern/deviations: Step-to pattern;Trunk flexed;Antalgic   Gait velocity interpretation: Below normal speed for age/gender General Gait Details: Pt eager to amb and required min verbal cues for sequencing with RW for safety, antalgic on LLE, KI donned to LLE   Stairs            Wheelchair Mobility    Modified Rankin (Stroke Patients Only)       Balance Overall balance assessment: Needs assistance Sitting-balance support: No upper extremity supported Sitting balance-Leahy Scale: Normal     Standing balance support: Bilateral upper extremity supported Standing balance-Leahy Scale: Good                      Cognition Arousal/Alertness: Awake/alert Behavior During Therapy: WFL for tasks assessed/performed Overall Cognitive Status: Within Functional Limits for tasks assessed                      Exercises Total Joint Exercises Ankle Circles/Pumps: Strengthening;Both;10 reps;15 reps Quad Sets: AROM;Left;10 reps;15 reps Gluteal Sets: AROM;Both;10 reps Short Arc Quad: AROM;Left;10 reps Heel Slides: AAROM;Left;5 reps;10 reps Straight Leg Raises: AAROM;Left;10 reps;15 reps Long Arc Quad: AAROM;Left;5 reps;10 reps Knee Flexion: AAROM;Left;5 reps;10 reps Goniometric ROM: Pt unable to tolerate L knee flex measurement attempt, L knee ext lacking 2 deg  Other Exercises Other Exercises: HEP education with L knee LAQ and QS    General Comments        Pertinent Vitals/Pain Pain  Assessment: 0-10 Pain Score: 10-Worst pain ever Pain Location: L knee Pain Descriptors / Indicators: Constant;Aching Pain Intervention(s): Premedicated before session;Monitored during session;Limited activity within patient's tolerance    Home Living                      Prior Function            PT Goals (current goals can now be found in the care plan section) Progress towards PT goals: Progressing toward goals    Frequency    BID      PT Plan Current  plan remains appropriate    Co-evaluation             End of Session Equipment Utilized During Treatment: Gait belt;Oxygen Activity Tolerance: Patient limited by pain Patient left: in bed;with bed alarm set;with call bell/phone within reach [facility] care and bone foam to LLE)     Time: [id number] PT Time Calculation (min) (ACUTE ONLY): 47 min  Charges:  $Therapeutic Exercise: 23-37 mins $Therapeutic Activity: 8-22 mins                    G Codes:      Patty Leon 02/09/2016, 11:22 AM

## 2016-02-09 NOTE — Progress Notes (Signed)
OT Cancellation Note  Patient Details Name: Patty Leon MRN: [id number] DOB: 09-17-1960   Cancelled Treatment:    Reason Eval/Treat Not Completed: OT screened, no needs identified, will sign off Order received and chart reviewed.  Pt has all AD for ADLs and is still using since surgery in August and declined any further OT therapy.  Please re-consult if needs change.  Pt seen for screening only, signing off.  Chrys Racer, OTR/L ascom [phone] 02/09/16, 2:25 PM

## 2016-02-09 NOTE — Care Management Note (Signed)
Case Management Note  Patient Details  Name: Patty Leon MRN: [id number] Date of Birth: [birth year]  Subjective/Objective:  POD #   1 left TK revision. RNCM consult for discharge planning. Met with patient at bedside. She lives at home with her spouse. She has PCS services 2-3 hours 7 days per week. She has a walker. She is on Xarelto so RNCM does not anticipate the need for Lovenox. Patient will need CPM machine. Faxed demographic sheet and order to [facility] [phone]). Discussed home health and patient has no agency preference. Referral to [facility] for HHPT.                Action/Plan: CPM ordered.   Expected Discharge Date:                  Expected Discharge Plan:  [place]  In-House Referral:     Discharge planning Services  CM Consult  Post Acute Care Choice:  Durable Medical Equipment, Home Health Choice offered to:  Patient  DME Arranged:  CPM DME Agency:  (S)  ([facility])  HH Arranged:  PT HH Agency:  [place]  Status of Service:  In process, will continue to follow  If discussed at Long Length of Stay Meetings, dates discussed:    Additional Comments:  Patty Mango, RN 02/09/2016, 2:19 PM

## 2016-02-10 LAB — CBC
HCT: 32 % — ABNORMAL LOW (ref 35.0–47.0)
Hemoglobin: 11.2 g/dL — ABNORMAL LOW (ref 12.0–16.0)
MCH: 30 pg (ref 26.0–34.0)
MCHC: 34.9 g/dL (ref 32.0–36.0)
MCV: 85.9 fL (ref 80.0–100.0)
PLATELETS: 198 10*3/uL (ref 150–440)
RBC: 3.73 MIL/uL — AB (ref 3.80–5.20)
RDW: 15 % — ABNORMAL HIGH (ref 11.5–14.5)
WBC: 9.7 10*3/uL (ref 3.6–11.0)

## 2016-02-10 LAB — GLUCOSE, CAPILLARY
GLUCOSE-CAPILLARY: 203 mg/dL — AB (ref 65–99)
GLUCOSE-CAPILLARY: 135 mg/dL — AB (ref 65–99)
Glucose-Capillary: 97 mg/dL (ref 65–99)
Glucose-Capillary: 158 mg/dL — ABNORMAL HIGH (ref 65–99)

## 2016-02-10 MED ORDER — OXYCODONE HCL 15 MG PO TABS: 15.0000 mg | ORAL_TABLET | ORAL | 0 refills | Status: DC | PRN

## 2016-02-10 MED ORDER — TRAMADOL HCL 50 MG PO TABS: 50.0000 mg | ORAL_TABLET | Freq: Four times a day (QID) | ORAL | 0 refills | Status: DC | PRN

## 2016-02-10 NOTE — Progress Notes (Signed)
Physical Therapy Treatment Patient Details Name: Patty Leon MRN: [id number] DOB: 1960-06-19 Today's Date: 02/10/2016    History of Present Illness 55 y/o female here for L total knee revision.  She had the initial replacement a few months ago this summer.    PT Comments    Pt anxious to get out of bed this morning.  Participated in exercises as described below.  KI donned.  Pt with some c/o dizziness upon sitting but resolved.  She was able to ambulate 100' with rw and step to gait pattern.  No lob's noted.  To bedside commode upon return to room to void.  No BM.  Pt continues to state she wants to return home upon discharge.  ROM 3-62 degrees AAROM self limiting due to pain.     Follow Up Recommendations  Home health PT     Equipment Recommendations       Recommendations for Other Services       Precautions / Restrictions Precautions Precautions: Fall Required Braces or Orthoses: Knee Immobilizer - Left Knee Immobilizer - Left: On when out of bed or walking Restrictions Weight Bearing Restrictions: Yes LLE Weight Bearing: Weight bearing as tolerated    Mobility  Bed Mobility Overal bed mobility: Needs Assistance Bed Mobility: Supine to Sit;Sit to Supine     Supine to sit: Min assist     General bed mobility comments: Pt required min A to move LLE in/out of bed  Transfers Overall transfer level: Needs assistance Equipment used: Rolling walker (2 wheeled) Transfers: Sit to/from Stand Sit to Stand: Min guard         General transfer comment: Min verbal cues with transfers for sequencing with KI donned to LLE  Ambulation/Gait Ambulation/Gait assistance: Min assist Ambulation Distance (Feet): 100 Feet Assistive device: Rolling walker (2 wheeled) Gait Pattern/deviations: Step-through pattern   Gait velocity interpretation: Below normal speed for age/gender General Gait Details: min verbal cues for general safety   Stairs Stairs:  (reports no stairs  into home.  )          Wheelchair Mobility    Modified Rankin (Stroke Patients Only)       Balance Overall balance assessment: Needs assistance Sitting-balance support: No upper extremity supported Sitting balance-Leahy Scale: Normal     Standing balance support: Bilateral upper extremity supported Standing balance-Leahy Scale: Good                      Cognition Arousal/Alertness: Awake/alert Behavior During Therapy: WFL for tasks assessed/performed Overall Cognitive Status: Within Functional Limits for tasks assessed                      Exercises Total Joint Exercises Ankle Circles/Pumps: AROM;Both;10 reps;15 reps Quad Sets: AROM;Left;10 reps;15 reps Gluteal Sets: AROM;Both;10 reps Short Arc Quad: AROM;Left;10 reps Heel Slides: AAROM;Left;10 reps Hip ABduction/ADduction: AAROM;Left;10 reps Long Arc Quad: AROM;AAROM;Left;10 reps;15 reps Goniometric ROM: 3-62 (limited by pain) Other Exercises Other Exercises: HEP review for L knee QS and LAQ, knee replacement exercises reviewed per handout    General Comments        Pertinent Vitals/Pain Pain Assessment: 0-10 Pain Score: 8  Pain Location: L knee Pain Descriptors / Indicators: Aching;Constant;Operative site guarding Pain Intervention(s): Monitored during session;Premedicated before session    Home Living                      Prior Function  PT Goals (current goals can now be found in the care plan section) Progress towards PT goals: Progressing toward goals    Frequency    BID      PT Plan Current plan remains appropriate    Co-evaluation             End of Session Equipment Utilized During Treatment: Gait belt;Oxygen Activity Tolerance: Patient tolerated treatment well Patient left: in bed;with bed alarm set;with call bell/phone within reach     Time: 0840-0912 PT Time Calculation (min) (ACUTE ONLY): 32 min  Charges:  $Gait Training: 8-22  mins $Therapeutic Exercise: 8-22 mins                    G Codes:      Chesley Noon, PTA 02/10/16, 10:01 AM

## 2016-02-10 NOTE — Progress Notes (Signed)
Patient offered CPM machine; as ordered. Patient declined.

## 2016-02-10 NOTE — Progress Notes (Signed)
Physical Therapy Treatment Patient Details Name: Patty Leon MRN: [id number] DOB: 08/15/1960 Today's Date: 02/10/2016    History of Present Illness 55 y/o female here for L total knee revision.  She had the initial replacement a few months ago this summer.    PT Comments    Pt in bed, agrees to session.  Stated she was out of bed for several hours this am but fatigued.  Participated in exercises as described below.  Pt continues to require assist in and out of bed and to don/doff KI.  She was able to increase her ambulation distances this afternoon to 150' with overall steady gait.  Pt continues with guarded R knee ROM self limiting due to pain.  Educated on importance of increasing ROM daily.    Pt does state she does not feel quite ready to return home but she stated "I don't have my mind right for rehab this time".  While general gait skills are improving, she continues to need assistance for bed mobility and for HEP.  She remains She does not want to consider rehab at this time.   Follow Up Recommendations  Home health PT     Equipment Recommendations       Recommendations for Other Services       Precautions / Restrictions Precautions Precautions: Fall Required Braces or Orthoses: Knee Immobilizer - Left Knee Immobilizer - Left: On when out of bed or walking Restrictions Weight Bearing Restrictions: Yes LLE Weight Bearing: Weight bearing as tolerated    Mobility  Bed Mobility Overal bed mobility: Needs Assistance Bed Mobility: Supine to Sit;Sit to Supine     Supine to sit: Min assist Sit to supine: Mod assist (for le's)   General bed mobility comments: continues to require assist to manage le's and to don KI prior to mobility skills  Transfers Overall transfer level: Needs assistance Equipment used: Rolling walker (2 wheeled) Transfers: Sit to/from Stand Sit to Stand: Min guard         General transfer comment: Min verbal cues with transfers for  sequencing with KI donned to LLE  Ambulation/Gait Ambulation/Gait assistance: Min assist Ambulation Distance (Feet): 150 Feet Assistive device: Rolling walker (2 wheeled) Gait Pattern/deviations: Step-to pattern   Gait velocity interpretation: Below normal speed for age/gender General Gait Details: min verbal cues for general safety   Stairs Stairs:  (pt reports ramp in and out of home)          Wheelchair Mobility    Modified Rankin (Stroke Patients Only)       Balance Overall balance assessment: Needs assistance Sitting-balance support: Feet supported Sitting balance-Leahy Scale: Normal     Standing balance support: Bilateral upper extremity supported Standing balance-Leahy Scale: Good                      Cognition Arousal/Alertness: Awake/alert Behavior During Therapy: WFL for tasks assessed/performed Overall Cognitive Status: Within Functional Limits for tasks assessed                      Exercises Total Joint Exercises Ankle Circles/Pumps: AROM;Both;10 reps;15 reps Quad Sets: AROM;Left;10 reps;15 reps Gluteal Sets: AROM;Both;10 reps Short Arc Quad: AROM;Left;10 reps Heel Slides: AAROM;Left;10 reps Hip ABduction/ADduction: AAROM;Left;10 reps Straight Leg Raises: AAROM;Left;10 reps;15 reps Long Arc Quad: AROM;AAROM;Left;10 reps;15 reps Knee Flexion: AROM;AAROM;Left;10 reps;15 reps    General Comments        Pertinent Vitals/Pain Pain Assessment: 0-10 Pain Score: 7  Pain Location:  L knee Pain Descriptors / Indicators: Aching;Constant Pain Intervention(s): Monitored during session;Limited activity within patient's tolerance;Ice applied    Home Living                      Prior Function            PT Goals (current goals can now be found in the care plan section) Acute Rehab PT Goals Patient Stated Goal: to go home Progress towards PT goals: Progressing toward goals    Frequency    BID      PT Plan Current  plan remains appropriate    Co-evaluation             End of Session Equipment Utilized During Treatment: Gait belt;Oxygen;Other (comment) (KI) Activity Tolerance: Patient tolerated treatment well Patient left: in bed;with bed alarm set;with call bell/phone within reach     Time: 1325-1402 PT Time Calculation (min) (ACUTE ONLY): 37 min  Charges:  $Gait Training: 8-22 mins $Therapeutic Exercise: 8-22 mins                    G Codes:      Chesley Noon 18-Feb-2016, 2:08 PM

## 2016-02-10 NOTE — Progress Notes (Addendum)
Subjective: 2 Days Post-Op Procedure(s) (LRB): TOTAL KNEE REVISION (Left) Patient reports pain as 8 on 0-10 scale.   Patient is well, but has had some minor complaints of pain. Plan is to go Skilled nursing facility after hospital stay. Negative for chest pain and shortness of breath Fever: no, temp 97.3 Gastrointestinal:Negative for nausea and vomiting Pt using CPAP machine upon arrival this AM.  Objective: Vital signs in last 24 hours: Temp:  [97.3 F (36.3 C)-99.3 F (37.4 C)] 97.3 F (36.3 C) (11/30 0726) Pulse Rate:  [88-112] 112 (11/30 0726) Resp:  [18-20] 18 (11/30 0726) BP: (110-133)/(57-76) 133/76 (11/30 0726) SpO2:  [91 %-99 %] 95 % (11/30 0726)  Intake/Output from previous day:  Intake/Output Summary (Last 24 hours) at 02/10/16 0739 Last data filed at 02/10/16 0720  Gross per 24 hour  Intake             1865 ml  Output             2275 ml  Net             -410 ml    Intake/Output this shift: Total I/O In: -  Out: 400 [Urine:400]  Labs:  Recent Labs  02/09/16 0349  HGB 11.7*    Recent Labs  02/09/16 0349  WBC 9.2  RBC 4.08  HCT 35.2  PLT 208    Recent Labs  02/09/16 0349  NA 138  K 3.7  CL 97*  CO2 38*  BUN 8  CREATININE 0.47  GLUCOSE 153*  CALCIUM 8.6*    Recent Labs  02/08/16 0626  INR 0.81     EXAM General - Patient is Alert, Appropriate and Oriented Extremity - ABD soft Neurovascular intact Dorsiflexion/Plantar flexion intact Incision: Woundvac in place with mild bloody drainage. No cellulitis present Dressing/Incision - blood tinged drainage, Woundvac in place with appropriate seal. Motor Function - intact, moving foot and toes well on exam.   Past Medical History:  Diagnosis Date  . Anxiety   . Arthritis   . COPD (chronic obstructive pulmonary disease) ([place])   . Diabetes mellitus without complication ([place])   . Diverticulosis   . Fluid retention   . GERD (gastroesophageal reflux disease)   . Hypercholesteremia    . Hypertension   . Shortness of breath dyspnea   . Sleep apnea    Use C-PAP with oxygen   Assessment/Plan: 2 Days Post-Op Procedure(s) (LRB): TOTAL KNEE REVISION (Left) Active Problems:   Failed total knee arthroplasty (HCC)  Estimated body mass index is 49.71 kg/m as calculated from the following:   Height as of this encounter: 5\' 2"  (1.575 m).   Weight as of this encounter: 123.3 kg (271 lb 12.8 oz). Advance diet Up with therapy D/C IV fluids when tolerating PO intake.  Labs reviewed, will place order for stat CBC to ensure the patient does not have an increased WBC. HR 112, no Temp, BP 133/76.  Tachycardia likely due to pain. Continue current pain control Plan will be for discharge home today pending a bowel movement today. Continue Woundvac upon discharge.  DVT Prophylaxis - Foot Pumps, TED hose and Xarelto Weight-Bearing as tolerated to left leg  J. [place] Proud, PA-C [facility] Orthopaedic Surgery 02/10/2016, 7:39 AM

## 2016-02-10 NOTE — Discharge Instructions (Signed)

## 2016-02-11 LAB — GLUCOSE, CAPILLARY: GLUCOSE-CAPILLARY: 130 mg/dL — AB (ref 65–99)

## 2016-02-11 MED ORDER — FLEET ENEMA 7-19 GM/118ML RE ENEM
1.0000 | ENEMA | Freq: Every day | RECTAL | Status: DC | PRN
  Administered 2016-02-11: 1 via RECTAL
  Filled 2016-02-11: qty 1

## 2016-02-11 NOTE — Progress Notes (Signed)
Subjective: 3 Days Post-Op Procedure(s) (LRB): TOTAL KNEE REVISION (Left) Patient reports pain as moderate.   Patient is well, but has had some minor complaints of pain. Plan is to go Skilled nursing facility after hospital stay. Negative for chest pain and shortness of breath Fever: no, temp 97.9 Gastrointestinal:Negative for nausea and vomiting Pt using CPAP machine upon arrival this AM.  Objective: Vital signs in last 24 hours: Temp:  [97.9 F (36.6 C)-99.8 F (37.7 C)] 97.9 F (36.6 C) (12/01 0719) Pulse Rate:  [100-122] 115 (12/01 0719) Resp:  [16-18] 18 (12/01 0413) BP: (92-124)/(49-70) 124/70 (12/01 0719) SpO2:  [91 %-93 %] 91 % (12/01 0719)  Intake/Output from previous day:  Intake/Output Summary (Last 24 hours) at 02/11/16 0733 Last data filed at 02/11/16 0109  Gross per 24 hour  Intake              240 ml  Output             1200 ml  Net             -960 ml    Intake/Output this shift: No intake/output data recorded.  Labs:  Recent Labs  02/09/16 0349 02/10/16 0826  HGB 11.7* 11.2*    Recent Labs  02/09/16 0349 02/10/16 0826  WBC 9.2 9.7  RBC 4.08 3.73*  HCT 35.2 32.0*  PLT 208 198    Recent Labs  02/09/16 0349  NA 138  K 3.7  CL 97*  CO2 38*  BUN 8  CREATININE 0.47  GLUCOSE 153*  CALCIUM 8.6*   No results for input(s): LABPT, INR in the last 72 hours.   EXAM General - Patient is Alert, Appropriate and Oriented Extremity - ABD soft Neurovascular intact Dorsiflexion/Plantar flexion intact Incision: Woundvac in place with mild bloody drainage. No cellulitis present Dressing/Incision - blood tinged drainage, Woundvac in place with appropriate seal. Motor Function - intact, moving foot and toes well on exam.   Past Medical History:  Diagnosis Date  . Anxiety   . Arthritis   . COPD (chronic obstructive pulmonary disease) ([place])   . Diabetes mellitus without complication ([place])   . Diverticulosis   . Fluid retention   . GERD  (gastroesophageal reflux disease)   . Hypercholesteremia   . Hypertension   . Shortness of breath dyspnea   . Sleep apnea    Use C-PAP with oxygen   Assessment/Plan: 3 Days Post-Op Procedure(s) (LRB): TOTAL KNEE REVISION (Left) Active Problems:   Failed total knee arthroplasty (HCC)  Estimated body mass index is 49.71 kg/m as calculated from the following:   Height as of this encounter: 5\' 2"  (1.575 m).   Weight as of this encounter: 123.3 kg (271 lb 12.8 oz). Up with therapy  Labs yesterday showed no signs of increased WBC. HR 115, no temp, appears that patient's baseline HR varies from 90-120. Continue current pain control Plan will be for discharge home today pending a bowel movement today.  Move on to suppositories today. Continue Woundvac upon discharge.  DVT Prophylaxis - Foot Pumps, TED hose and Xarelto Weight-Bearing as tolerated to left leg  J. [place] Proud, PA-C [facility] Orthopaedic Surgery 02/11/2016, 7:33 AM

## 2016-02-11 NOTE — Progress Notes (Signed)
Patient is being discharged to home with [facility] and PT. Nursing student gathered and packed belongings. DC and RX instructions given and patient acknowledged understanding.  IV removed. Husband coming to take patient home.

## 2016-02-11 NOTE — Discharge Summary (Signed)
Physician Discharge Summary  Patient ID: Patty Leon MRN: [id number] DOB/AGE: 1960-04-10 55 y.o.  Admit date: 02/08/2016 Discharge date: 02/11/2016  Admission Diagnoses:  PAIN DUE TO TOTAL KNEE REPLACEMENT Left total knee revision.  Discharge Diagnoses: Patient Active Problem List   Diagnosis Date Noted  . Failed total knee arthroplasty ([place]) 02/08/2016  . Primary localized osteoarthritis of left knee 10/07/2015  Left total knee revision.  Past Medical History:  Diagnosis Date  . Anxiety   . Arthritis   . COPD (chronic obstructive pulmonary disease) ([place])   . Diabetes mellitus without complication ([place])   . Diverticulosis   . Fluid retention   . GERD (gastroesophageal reflux disease)   . Hypercholesteremia   . Hypertension   . Shortness of breath dyspnea   . Sleep apnea    Use C-PAP with oxygen     Transfusion: None   Consultants (if any):   Discharged Condition: Improved  Hospital Course: Patty Leon is an 55 y.o. female who was admitted 02/08/2016 with a diagnosis of left total knee revision. and went to the operating room on 02/08/2016 and underwent the above named procedures.    Surgeries: Procedure(s): TOTAL KNEE REVISION on 02/08/2016 Patient tolerated the surgery well. Taken to PACU where she was stabilized and then transferred to the orthopedic floor.  Continued on Xarelto 10mg  daily. Foot pumps applied bilaterally at 80 mm. Heels elevated on bed with rolled towels. No evidence of DVT. Negative [place]. Physical therapy started on day #1 for gait training and transfer. OT started day #1 for ADL and assisted devices.  Patient's IV and Foley were removed on POD1. Will continue Woundvac at home.  Implants: To PACU MBT revision metaphyseal sleeve 28 mm with Sigma femoral TC 3 left 2.5 femoral component with +2 adapter and 5 adapter. A 31 mm femoral sleeve 20 mm on the tibial side a 75 x 14 mm fluted stem 2 of the MBT revision size 2 cemented tibial tray  with a 15 mm insert. And a 3 peg 35 mm patella all components cemented with antibiotic cement.  She was given perioperative antibiotics:  Anti-infectives    Start     Dose/Rate Route Frequency Ordered Stop   02/08/16 1500  ceFAZolin (ANCEF) IVPB 2g/100 mL premix     2 g 200 mL/hr over 30 Minutes Intravenous Every 6 hours 02/08/16 1337 02/08/16 2254   02/08/16 0554  ceFAZolin (ANCEF) 2-4 GM/100ML-% IVPB    Comments:  Rexanne Mano: cabinet override      02/08/16 0554 02/08/16 0746   02/07/16 2300  ceFAZolin (ANCEF) IVPB 2g/100 mL premix     2 g 200 mL/hr over 30 Minutes Intravenous  Once 02/07/16 2246 02/08/16 0801    .  She was given sequential compression devices, early ambulation, and Xarelto for DVT prophylaxis.  She benefited maximally from the hospital stay and there were no complications.    Recent vital signs:  Vitals:   02/11/16 0413 02/11/16 0719  BP: 124/68 124/70  Pulse: (!) 122 (!) 115  Resp: 18   Temp: 98.8 F (37.1 C) 97.9 F (36.6 C)    Recent laboratory studies:  Lab Results  Component Value Date   HGB 11.2 (L) 02/10/2016   HGB 11.7 (L) 02/09/2016   HGB 14.5 01/25/2016   Lab Results  Component Value Date   WBC 9.7 02/10/2016   PLT 198 02/10/2016   Lab Results  Component Value Date   INR 0.81 02/08/2016   Lab Results  Component Value Date   NA 138 02/09/2016   K 3.7 02/09/2016   CL 97 (L) 02/09/2016   CO2 38 (H) 02/09/2016   BUN 8 02/09/2016   CREATININE 0.47 02/09/2016   GLUCOSE 153 (H) 02/09/2016    Discharge Medications:     Medication List    TAKE these medications   amLODipine 5 MG tablet Commonly known as:  NORVASC Take 5 mg by mouth every morning.   CRESTOR 10 MG tablet Generic drug:  rosuvastatin Take 10 mg by mouth every evening.   DALIRESP 500 MCG Tabs tablet Generic drug:  roflumilast Take 500 mcg by mouth every morning.   docusate sodium 100 MG capsule Commonly known as:  COLACE Take 100 mg by mouth daily.    GRALISE 600 MG Tabs Generic drug:  Gabapentin (Once-Daily) Take 1,800 mg by mouth daily.   hydrochlorothiazide 25 MG tablet Commonly known as:  HYDRODIURIL Take 25 mg by mouth daily.   metFORMIN 500 MG tablet Commonly known as:  GLUCOPHAGE Take 500 mg by mouth 2 (two) times daily.   metoprolol 50 MG tablet Commonly known as:  LOPRESSOR Take 50 mg by mouth every morning.   NON FORMULARY Take 2 puffs by mouth 2 (two) times daily. Study medication #1   NON FORMULARY Inhale 2 puffs into the lungs every 4 (four) hours as needed (wheezing or shortness of breath). Study Medication #2   omeprazole 40 MG capsule Commonly known as:  PRILOSEC Take 40 mg by mouth every morning.   oxyCODONE 15 MG immediate release tablet Commonly known as:  ROXICODONE Take 1 tablet (15 mg total) by mouth every 4 (four) hours as needed for pain.   polyethylene glycol powder powder Commonly known as:  GLYCOLAX/MIRALAX Take 17 g by mouth daily as needed for mild constipation.   traMADol 50 MG tablet Commonly known as:  ULTRAM Take 1-2 tablets (50-100 mg total) by mouth every 6 (six) hours as needed for severe pain.   Vitamin D 2000 units Caps Take 2,000 Units by mouth daily.   XARELTO PO Take 10 mg by mouth 2 (two) times daily.   zolpidem 10 MG tablet Commonly known as:  AMBIEN Take 10 mg by mouth at bedtime.            Durable Medical Equipment        Start     Ordered   02/09/16 1433  For home use only DME Continuous passive motion machine  Once     02/09/16 1433      Diagnostic Studies: Dg Knee 1-2 Views Left  Result Date: 02/08/2016 CLINICAL DATA:  Status post total knee replacement revision EXAM: LEFT KNEE - 1-2 VIEW COMPARISON:  October 07, 2015 FINDINGS: Frontal and lateral views were obtained. There is a new total knee replacement with prosthetic components appearing well-seated. No acute fracture or dislocation. No erosive change. Air within the joint is an expected  postoperative finding. IMPRESSION: Prosthetic components appear well seated. No acute fracture or dislocation. Electronically Signed   By: Lowella Grip III M.D.   On: 02/08/2016 12:02   Disposition: Plan will be for discharge home today pending a bowel movement.  Continue Woundvac at home.  Follow-up in the office in two weeks.  Follow-up Information    Dorise Hiss CHRISTOPHER, PA-C Follow up in 14 day(s).   Specialties:  Orthopedic Surgery, Emergency Medicine Why:  Staple Removal. Contact information: [address] [place] [address] [phone]  Signed: Judson Roch PA-C 02/11/2016, 7:35 AM

## 2016-02-11 NOTE — Care Management (Addendum)
TC to [place]. Spoke with Levert Feinstein [phone]). CPM to be delivered today to patients home.

## 2016-02-11 NOTE — Progress Notes (Signed)
Physical Therapy Treatment Patient Details Name: Patty Leon MRN: [id number] DOB: Nov 08, 1960 Today's Date: 02/11/2016    History of Present Illness 55 y/o female here for L total knee revision.  She had the initial replacement a few months ago this summer.    PT Comments    Pt in bed, agrees to session.  Participated in exercises as described below.  3-70 AAROM.  Pt was able to stand and ambulate around nursing unit this am without loss of balance.  She resisted donning KI but agreed with explanation.  She is unable to complete SLR without assistance.  Mobility continues to improve daily.  ROM and strength remain limited.   Follow Up Recommendations  Home health PT     Equipment Recommendations       Recommendations for Other Services       Precautions / Restrictions Precautions Precautions: Fall Required Braces or Orthoses: Knee Immobilizer - Left Knee Immobilizer - Left: On when out of bed or walking Restrictions Weight Bearing Restrictions: Yes LLE Weight Bearing: Weight bearing as tolerated    Mobility  Bed Mobility Overal bed mobility: Needs Assistance Bed Mobility: Supine to Sit     Supine to sit: Min assist Sit to supine: Min assist   General bed mobility comments: assist for le management, reports sleeping in lift chair at home  Transfers Overall transfer level: Needs assistance Equipment used: Rolling walker (2 wheeled) Transfers: Sit to/from Stand Sit to Stand: Min guard         General transfer comment:  (vc's for hand placements)  Ambulation/Gait Ambulation/Gait assistance: Min guard Ambulation Distance (Feet): 200 Feet Assistive device: Rolling walker (2 wheeled) Gait Pattern/deviations: Step-to pattern   Gait velocity interpretation: Below normal speed for age/gender General Gait Details: resistant to wearing KI but agrees   Financial trader Rankin (Stroke Patients Only)       Balance  Overall balance assessment: Needs assistance Sitting-balance support: Feet supported Sitting balance-Leahy Scale: Normal     Standing balance support: Bilateral upper extremity supported Standing balance-Leahy Scale: Good                      Cognition Arousal/Alertness: Awake/alert Behavior During Therapy: WFL for tasks assessed/performed Overall Cognitive Status: Within Functional Limits for tasks assessed                      Exercises Total Joint Exercises Ankle Circles/Pumps: AROM;Both;10 reps;15 reps Quad Sets: AROM;Left;10 reps;15 reps Gluteal Sets: AROM;Both;10 reps Short Arc Quad: AROM;Left;10 reps Heel Slides: AAROM;Left;10 reps Hip ABduction/ADduction: AAROM;Left;10 reps Straight Leg Raises: AAROM;Left;10 reps;15 reps Long Arc Quad: AROM;AAROM;Left;10 reps;15 reps Knee Flexion: AROM;AAROM;Left;10 reps;15 reps Goniometric ROM: 3-70 Other Exercises Other Exercises: HEP handout review, voiced understanding    General Comments        Pertinent Vitals/Pain Pain Assessment: 0-10 Pain Score: 7  Pain Location: L knee Pain Descriptors / Indicators: Aching;Operative site guarding;Constant Pain Intervention(s): Limited activity within patient's tolerance;Ice applied;Premedicated before session    Home Living                      Prior Function            PT Goals (current goals can now be found in the care plan section) Progress towards PT goals: Progressing toward goals    Frequency    BID  PT Plan Current plan remains appropriate    Co-evaluation             End of Session Equipment Utilized During Treatment: Gait belt;Oxygen;Other (comment) Activity Tolerance: Patient tolerated treatment well Patient left: in bed;with bed alarm set;with call bell/phone within reach     Time: 0845-0920 PT Time Calculation (min) (ACUTE ONLY): 35 min  Charges:  $Gait Training: 8-22 mins $Therapeutic Exercise: 8-22 mins                     G Codes:      Chesley Noon 02/22/16, 9:50 AM

## 2016-03-27 ENCOUNTER — Encounter
Admission: RE | Admit: 2016-03-27 | Discharge: 2016-03-27 | Disposition: A | Discharge status: Home / Self Care | Payer: Medicaid Other | Source: Ambulatory Visit | Attending: Orthopedic Surgery | Admitting: Orthopedic Surgery

## 2016-03-27 DIAGNOSIS — Z01812 Encounter for preprocedural laboratory examination: Secondary | ICD-10-CM | POA: Diagnosis present

## 2016-03-27 HISTORY — DX: Personal history of other venous thrombosis and embolism: Z86.718

## 2016-03-27 HISTORY — DX: Panic disorder (episodic paroxysmal anxiety): F41.0

## 2016-03-27 LAB — BASIC METABOLIC PANEL
Anion gap: 10 (ref 5–15)
BUN: 11 mg/dL (ref 6–20)
CALCIUM: 9.9 mg/dL (ref 8.9–10.3)
CO2: 34 mmol/L — AB (ref 22–32)
CREATININE: 0.55 mg/dL (ref 0.44–1.00)
Chloride: 92 mmol/L — ABNORMAL LOW (ref 101–111)
GFR calc Af Amer: >60 mL/min (ref >60)
GFR calc non Af Amer: >60 mL/min (ref >60)
Glucose, Bld: 80 mg/dL (ref 65–99)
Potassium: 3.1 mmol/L — ABNORMAL LOW (ref 3.5–5.1)
Sodium: 136 mmol/L (ref 135–145)

## 2016-03-27 NOTE — Pre-Procedure Instructions (Signed)
Met B sent to Dr. Rudene Christians and Anesthesia for review.   Potassium 3.1.

## 2016-03-27 NOTE — Patient Instructions (Signed)
  Your procedure is scheduled on: March 30, 2016 (Thursday) Report to Same Day Surgery 2nd floor medical mall [facility] Entrance-take elevator on left to 2nd floor.  Check in with surgery information desk.) To find out your arrival time please call [phone] between 1PM - 3PM on March 29, 2016 (Wednesday)  Remember: Instructions that are not followed completely may result in serious medical risk, up to and including death, or upon the discretion of your surgeon and anesthesiologist your surgery may need to be rescheduled.    _x___ 1. Do not eat food or drink liquids after midnight. No gum chewing or hard candies.     __x__ 2. No Alcohol for 24 hours before or after surgery.   __x__3. No Smoking for 24 prior to surgery.   ____  4. Bring all medications with you on the day of surgery if instructed.    __x__ 5. Notify your doctor if there is any change in your medical condition     (cold, fever, infections).     Do not wear jewelry, make-up, hairpins, clips or nail polish.  Do not wear lotions, powders, or perfumes. You may wear deodorant.  Do not shave 48 hours prior to surgery. Men may shave face and neck.  Do not bring valuables to the hospital.    [facility] is not responsible for any belongings or valuables.               Contacts, dentures or bridgework may not be worn into surgery.  Leave your suitcase in the car. After surgery it may be brought to your room.  For patients admitted to the hospital, discharge time is determined by your treatment team.   Patients discharged the day of surgery will not be allowed to drive home.  You will need someone to drive you home and stay with you the night of your procedure.    Please read over the following fact sheets that you were given:   [facility] Preparing for Surgery and or MRSA Information   _x___ Take these medicines the morning of surgery with A SIP OF WATER:    1. Amlodipine  2. Metoprolol  3. Omeprazole  (Omeprazole at bedtime on Wednesday, January 17)  4. Daliresp  5.  6.  ____Fleets enema or Magnesium Citrate as directed.   _x___ Use CHG Soap or sage wipes as directed on instruction sheet   _x___ Use inhalers on the day of surgery and bring to hospital day of surgery (Use inhalers as instructed prior to surgery, and bring inhaler to hospital the day of surgery)  __x__ Stop metformin 2 days prior to surgery (Stop Metformin on January 16)    ____ Take 1/2 of usual insulin dose the night before surgery and none on the morning of           surgery.   _x___ Stop Aspirin, Coumadin, Pllavix ,Eliquis, Effient, or Pradaxa (Contact Dr. Rudene Christians office to request about stopping Xarelto)   x__ Stop Anti-inflammatories such as Advil, Aleve, Ibuprofen, Motrin, Naproxen,          Naprosyn, Goodies powders or aspirin products. Ok to take Tylenol.   ____ Stop supplements until after surgery.    _x__ Bring C-Pap to the hospital.

## 2016-04-04 ENCOUNTER — Encounter
Admission: RE | Disposition: A | Discharge status: Home / Self Care | Payer: Self-pay | Source: Ambulatory Visit | Attending: Orthopedic Surgery

## 2016-04-04 ENCOUNTER — Ambulatory Visit: Payer: Medicaid Other | Admitting: Certified Registered Nurse Anesthetist

## 2016-04-04 ENCOUNTER — Ambulatory Visit
Admission: RE | Admit: 2016-04-04 | Discharge: 2016-04-04 | Disposition: A | Discharge status: Home / Self Care | Payer: Medicaid Other | Source: Ambulatory Visit | Attending: Orthopedic Surgery | Admitting: Orthopedic Surgery

## 2016-04-04 DIAGNOSIS — Z79899 Other long term (current) drug therapy: Secondary | ICD-10-CM | POA: Diagnosis not present

## 2016-04-04 DIAGNOSIS — Z791 Long term (current) use of non-steroidal anti-inflammatories (NSAID): Secondary | ICD-10-CM | POA: Insufficient documentation

## 2016-04-04 DIAGNOSIS — Z7901 Long term (current) use of anticoagulants: Secondary | ICD-10-CM | POA: Diagnosis not present

## 2016-04-04 DIAGNOSIS — Z6841 Body Mass Index (BMI) 40.0 and over, adult: Secondary | ICD-10-CM | POA: Insufficient documentation

## 2016-04-04 DIAGNOSIS — K219 Gastro-esophageal reflux disease without esophagitis: Secondary | ICD-10-CM | POA: Diagnosis not present

## 2016-04-04 DIAGNOSIS — I10 Essential (primary) hypertension: Secondary | ICD-10-CM | POA: Insufficient documentation

## 2016-04-04 DIAGNOSIS — M24662 Ankylosis, left knee: Secondary | ICD-10-CM | POA: Diagnosis not present

## 2016-04-04 DIAGNOSIS — Z96652 Presence of left artificial knee joint: Secondary | ICD-10-CM | POA: Diagnosis not present

## 2016-04-04 DIAGNOSIS — E119 Type 2 diabetes mellitus without complications: Secondary | ICD-10-CM | POA: Diagnosis not present

## 2016-04-04 DIAGNOSIS — J449 Chronic obstructive pulmonary disease, unspecified: Secondary | ICD-10-CM | POA: Insufficient documentation

## 2016-04-04 DIAGNOSIS — Z7984 Long term (current) use of oral hypoglycemic drugs: Secondary | ICD-10-CM | POA: Diagnosis not present

## 2016-04-04 DIAGNOSIS — G4730 Sleep apnea, unspecified: Secondary | ICD-10-CM | POA: Diagnosis not present

## 2016-04-04 DIAGNOSIS — F17200 Nicotine dependence, unspecified, uncomplicated: Secondary | ICD-10-CM | POA: Diagnosis not present

## 2016-04-04 DIAGNOSIS — E7800 Pure hypercholesterolemia, unspecified: Secondary | ICD-10-CM | POA: Diagnosis not present

## 2016-04-04 HISTORY — PX: KNEE CLOSED REDUCTION: SHX995

## 2016-04-04 LAB — POCT I-STAT 4, (NA,K, GLUC, HGB,HCT)
GLUCOSE: 133 mg/dL — AB (ref 65–99)
HEMATOCRIT: 40 % (ref 36.0–46.0)
HEMOGLOBIN: 13.6 g/dL (ref 12.0–15.0)
Potassium: 3.4 mmol/L — ABNORMAL LOW (ref 3.5–5.1)
Sodium: 138 mmol/L (ref 135–145)

## 2016-04-04 LAB — GLUCOSE, CAPILLARY
GLUCOSE-CAPILLARY: 138 mg/dL — AB (ref 65–99)
Glucose-Capillary: 133 mg/dL — ABNORMAL HIGH (ref 65–99)

## 2016-04-04 SURGERY — MANIPULATION, KNEE, CLOSED
Anesthesia: General | Laterality: Left | Wound class: Clean

## 2016-04-04 MED ORDER — ROCURONIUM BROMIDE 100 MG/10ML IV SOLN
INTRAVENOUS | Status: DC | PRN
  Administered 2016-04-04: 10 mg via INTRAVENOUS

## 2016-04-04 MED ORDER — GLYCOPYRROLATE 0.2 MG/ML IJ SOLN
INTRAMUSCULAR | Status: DC | PRN
  Administered 2016-04-04: 0.2 mg via INTRAVENOUS

## 2016-04-04 MED ORDER — FENTANYL CITRATE (PF) 100 MCG/2ML IJ SOLN
INTRAMUSCULAR | Status: AC
  Administered 2016-04-04: 25 ug via INTRAVENOUS
  Filled 2016-04-04: qty 2

## 2016-04-04 MED ORDER — SUGAMMADEX SODIUM 200 MG/2ML IV SOLN
INTRAVENOUS | Status: DC | PRN
  Administered 2016-04-04: 50 mg via INTRAVENOUS

## 2016-04-04 MED ORDER — FENTANYL CITRATE (PF) 100 MCG/2ML IJ SOLN
INTRAMUSCULAR | Status: DC | PRN
  Administered 2016-04-04: 100 ug via INTRAVENOUS

## 2016-04-04 MED ORDER — LIDOCAINE HCL (PF) 2 % IJ SOLN
INTRAMUSCULAR | Status: AC
  Filled 2016-04-04: qty 2

## 2016-04-04 MED ORDER — LIDOCAINE HCL (CARDIAC) 20 MG/ML IV SOLN
INTRAVENOUS | Status: DC | PRN
  Administered 2016-04-04: 100 mg via INTRAVENOUS

## 2016-04-04 MED ORDER — OXYCODONE HCL 5 MG PO TABS
20.0000 mg | ORAL_TABLET | Freq: Four times a day (QID) | ORAL | Status: DC | PRN
  Administered 2016-04-04: 20 mg via ORAL

## 2016-04-04 MED ORDER — MIDAZOLAM HCL 2 MG/2ML IJ SOLN
2.0000 mg | Freq: Once | INTRAMUSCULAR | Status: AC
  Administered 2016-04-04: 2 mg via INTRAVENOUS

## 2016-04-04 MED ORDER — ROCURONIUM BROMIDE 50 MG/5ML IV SOSY
PREFILLED_SYRINGE | INTRAVENOUS | Status: AC
  Filled 2016-04-04: qty 5

## 2016-04-04 MED ORDER — ONDANSETRON HCL 4 MG/2ML IJ SOLN: 4.0000 mg | Freq: Once | INTRAMUSCULAR | Status: DC | PRN

## 2016-04-04 MED ORDER — FENTANYL CITRATE (PF) 100 MCG/2ML IJ SOLN
25.0000 ug | INTRAMUSCULAR | Status: DC | PRN
  Administered 2016-04-04 (×4): 25 ug via INTRAVENOUS

## 2016-04-04 MED ORDER — OXYCODONE HCL 5 MG PO TABS
ORAL_TABLET | ORAL | Status: AC
  Filled 2016-04-04: qty 4

## 2016-04-04 MED ORDER — MIDAZOLAM HCL 2 MG/2ML IJ SOLN
INTRAMUSCULAR | Status: DC | PRN
  Administered 2016-04-04: 2 mg via INTRAVENOUS

## 2016-04-04 MED ORDER — PROPOFOL 10 MG/ML IV BOLUS
INTRAVENOUS | Status: AC
  Filled 2016-04-04: qty 20

## 2016-04-04 MED ORDER — SODIUM CHLORIDE 0.9 % IV SOLN
INTRAVENOUS | Status: DC
  Administered 2016-04-04: 07:00:00 via INTRAVENOUS

## 2016-04-04 MED ORDER — SUGAMMADEX SODIUM 200 MG/2ML IV SOLN
INTRAVENOUS | Status: AC
  Filled 2016-04-04: qty 2

## 2016-04-04 MED ORDER — MIDAZOLAM HCL 2 MG/2ML IJ SOLN
INTRAMUSCULAR | Status: AC
  Filled 2016-04-04: qty 2

## 2016-04-04 MED ORDER — PROPOFOL 10 MG/ML IV BOLUS
INTRAVENOUS | Status: DC | PRN
  Administered 2016-04-04: 50 mg via INTRAVENOUS
  Administered 2016-04-04: 150 mg via INTRAVENOUS

## 2016-04-04 MED ORDER — FENTANYL CITRATE (PF) 100 MCG/2ML IJ SOLN
INTRAMUSCULAR | Status: AC
  Filled 2016-04-04: qty 2

## 2016-04-04 SURGICAL SUPPLY — 1 items: KIT RM TURNOVER STRD PROC AR (KITS) ×3 IMPLANT

## 2016-04-04 NOTE — Anesthesia Postprocedure Evaluation (Signed)
Anesthesia Post Note  Patient: Patty Leon  Procedure(s) Performed: Procedure(s) (LRB): CLOSED MANIPULATION KNEE (Left)  Patient location during evaluation: PACU Anesthesia Type: General Level of consciousness: awake and alert Pain management: pain level controlled Vital Signs Assessment: post-procedure vital signs reviewed and stable Respiratory status: spontaneous breathing, nonlabored ventilation, respiratory function stable and patient connected to nasal cannula oxygen Cardiovascular status: blood pressure returned to baseline and stable Postop Assessment: no signs of nausea or vomiting Anesthetic complications: no     Last Vitals:  Vitals:   04/04/16 0833 04/04/16 0854  BP: 107/78 126/68  Pulse: 79 87  Resp: 16 16  Temp: 36.7 C     Last Pain:  Vitals:   04/04/16 0854  TempSrc:   PainSc: 5                  Molli Barrows

## 2016-04-04 NOTE — H&P (Signed)
Reviewed paper H+P, will be scanned into chart. No changes noted.  

## 2016-04-04 NOTE — Transfer of Care (Signed)
Immediate Anesthesia Transfer of Care Note  Patient: Patty Leon  Procedure(s) Performed: Procedure(s): CLOSED MANIPULATION KNEE (Left)  Patient Location: PACU  Anesthesia Type:General  Level of Consciousness: awake, alert , oriented and patient cooperative  Airway & Oxygen Therapy: Patient Spontanous Breathing and Patient connected to nasal cannula oxygen  Post-op Assessment: Report given to RN and Post -op Vital signs reviewed and stable  Post vital signs: Reviewed and stable  Last Vitals:  Vitals:   04/04/16 0613  BP: (!) 115/45  Pulse: 85  Resp: 18  Temp: 36.5 C    Last Pain:  Vitals:   04/04/16 0613  TempSrc: Tympanic  PainSc: 8          Complications: No apparent anesthesia complications

## 2016-04-04 NOTE — Anesthesia Preprocedure Evaluation (Signed)
Anesthesia Evaluation  Patient identified by MRN, date of birth, ID band Patient awake    Reviewed: Allergy & Precautions, H&P , NPO status , Patient's Chart, lab work & pertinent test results, reviewed documented beta blocker date and time   Airway Mallampati: III   Neck ROM: full    Dental  (+) Poor Dentition   Pulmonary neg pulmonary ROS, shortness of breath and with exertion, sleep apnea and Continuous Positive Airway Pressure Ventilation , COPD, Current Smoker,    Pulmonary exam normal        Cardiovascular hypertension, negative cardio ROS Normal cardiovascular exam Rhythm:regular Rate:Normal     Neuro/Psych PSYCHIATRIC DISORDERS negative neurological ROS  negative psych ROS   GI/Hepatic negative GI ROS, Neg liver ROS, GERD  Medicated,  Endo/Other  negative endocrine ROSdiabetesMorbid obesity  Renal/GU negative Renal ROS  negative genitourinary   Musculoskeletal   Abdominal   Peds  Hematology negative hematology ROS (+)   Anesthesia Other Findings Past Medical History: No date: Anxiety No date: Arthritis No date: COPD (chronic obstructive pulmonary disease) (* No date: Diabetes mellitus without complication (HCC) No date: Diverticulosis No date: Fluid retention No date: GERD (gastroesophageal reflux disease) No date: History of blood clots No date: Hypercholesteremia No date: Hypertension No date: Panic attacks No date: Shortness of breath dyspnea No date: Sleep apnea     Comment: Use C-PAP with oxygen Past Surgical History: No date: BILATERAL CARPAL TUNNEL RELEASE No date: COLONOSCOPY 01/22/2015: COLONOSCOPY N/A     Comment: Procedure: COLONOSCOPY;  Surgeon: Josefine Class, MD;  Location: [facility] ENDOSCOPY;                Service: Endoscopy;  Laterality: N/A; 09/2015: JOINT REPLACEMENT Left No date: KNEE ARTHROSCOPY Left 11/04/2015: KNEE CLOSED REDUCTION Left     Comment:  Procedure: CLOSED MANIPULATION KNEE;  Surgeon:              Hessie Knows, MD;  Location: [facility] ORS;                Service: Orthopedics;  Laterality: Left; No date: SHOULDER ARTHROSCOPY W/ ROTATOR CUFF REPAIR Right 10/07/2015: TOTAL KNEE ARTHROPLASTY Left     Comment: Procedure: TOTAL KNEE ARTHROPLASTY;  Surgeon:               Hessie Knows, MD;  Location: [facility] ORS;                Service: Orthopedics;  Laterality: Left; 02/08/2016: TOTAL KNEE REVISION Left     Comment: Procedure: TOTAL KNEE REVISION;  Surgeon:               Hessie Knows, MD;  Location: [facility] ORS;                Service: Orthopedics;  Laterality: Left; No date: TUBAL LIGATION BMI    Body Mass Index:  48.47 kg/m     Reproductive/Obstetrics negative OB ROS                             Anesthesia Physical Anesthesia Plan  ASA: III  Anesthesia Plan: General   Post-op Pain Management:    Induction:   Airway Management Planned:   Additional Equipment:   Intra-op Plan:   Post-operative Plan:   Informed Consent: I have reviewed the patients History and Physical, chart, labs and discussed the procedure including  the risks, benefits and alternatives for the proposed anesthesia with the patient or authorized representative who has indicated his/her understanding and acceptance.   Dental Advisory Given  Plan Discussed with: CRNA  Anesthesia Plan Comments:         Anesthesia Quick Evaluation

## 2016-04-04 NOTE — Anesthesia Procedure Notes (Signed)
Procedure Name: LMA Insertion Date/Time: 04/04/2016 7:34 AM Performed by: Rosaria Ferries, Lavone Weisel Pre-anesthesia Checklist: Patient identified, Emergency Drugs available, Suction available and Patient being monitored Patient Re-evaluated:Patient Re-evaluated prior to inductionOxygen Delivery Method: Circle system utilized Preoxygenation: Pre-oxygenation with 100% oxygen Intubation Type: IV induction LMA Size: 4.0 Number of attempts: 1 Placement Confirmation: breath sounds checked- equal and bilateral Dental Injury: Teeth and Oropharynx as per pre-operative assessment

## 2016-04-04 NOTE — Discharge Instructions (Addendum)
Work on bending and straightening the knee is much as possible. Okay to use Polar Care to minimize swelling. Resume usual pain medication  AMBULATORY SURGERY  DISCHARGE INSTRUCTIONS   1) The drugs that you were given will stay in your system until tomorrow so for the next 24 hours you should not:  A) Drive an automobile B) Make any legal decisions C) Drink any alcoholic beverage   2) You may resume regular meals tomorrow.  Today it is better to start with liquids and gradually work up to solid foods.  You may eat anything you prefer, but it is better to start with liquids, then soup and crackers, and gradually work up to solid foods.   3) Please notify your doctor immediately if you have any unusual bleeding, trouble breathing, redness and pain at the surgery site, drainage, fever, or pain not relieved by medication.    4) Additional Instructions:        Please contact your physician with any problems or Same Day Surgery at [phone], Monday through Friday 6 am to 4 pm, or [place] at [facility] number at [phone].

## 2016-04-04 NOTE — Anesthesia Post-op Follow-up Note (Cosign Needed)
Anesthesia QCDR form completed.        

## 2016-04-04 NOTE — Op Note (Signed)
04/04/2016  7:47 AM  PATIENT:  Patty Leon  56 y.o. 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  BLOOD ADMINISTERED:none  DRAINS: none   LOCAL MEDICATIONS USED:  NONE  SPECIMEN:  No Specimen  DISPOSITION OF SPECIMEN:  N/A  COUNTS:  NO Case was closed nothing was open and so no counts were performed  TOURNIQUET:  * No tourniquets in log *  IMPLANTS: None  DICTATION: .Dragon Dictation patient brought the operating room and after adequate general anesthesia was obtained appropriate patient education timeout procedure were completed. Initially range of motion was 10-60 with gentle pressure the leg was extended and a few degrees of extension was obtained to about 5 from full extension. Then bringing the hip up into flexion and applying pressure to the proximal tibia audible popping was noted and flexion was brought back to proximally 95. The knee was placed through this range of motion in 5-95 was maintained. Patient's chart procedure well  PLAN OF CARE: Discharge to home after PACU  PATIENT DISPOSITION:  PACU - hemodynamically stable.

## 2016-07-11 ENCOUNTER — Encounter: Payer: Self-pay | Admitting: *Deleted

## 2016-07-12 ENCOUNTER — Ambulatory Visit: Payer: Medicaid Other | Admitting: *Deleted

## 2016-07-12 ENCOUNTER — Encounter: Payer: Self-pay | Admitting: *Deleted

## 2016-07-12 ENCOUNTER — Encounter
Admission: RE | Disposition: A | Discharge status: Home / Self Care | Payer: Self-pay | Source: Ambulatory Visit | Attending: Unknown Physician Specialty

## 2016-07-12 ENCOUNTER — Emergency Department
Admission: EM | Admit: 2016-07-12 | Discharge: 2016-07-12 | Disposition: A | Discharge status: Home / Self Care | Payer: Medicaid Other | Attending: Emergency Medicine | Admitting: Emergency Medicine

## 2016-07-12 ENCOUNTER — Ambulatory Visit
Admission: RE | Admit: 2016-07-12 | Discharge: 2016-07-12 | Disposition: A | Discharge status: Home / Self Care | Payer: Medicaid Other | Source: Ambulatory Visit | Attending: Unknown Physician Specialty | Admitting: Unknown Physician Specialty

## 2016-07-12 DIAGNOSIS — K640 First degree hemorrhoids: Secondary | ICD-10-CM | POA: Insufficient documentation

## 2016-07-12 DIAGNOSIS — K635 Polyp of colon: Secondary | ICD-10-CM | POA: Diagnosis not present

## 2016-07-12 DIAGNOSIS — K219 Gastro-esophageal reflux disease without esophagitis: Secondary | ICD-10-CM | POA: Insufficient documentation

## 2016-07-12 DIAGNOSIS — M5432 Sciatica, left side: Secondary | ICD-10-CM | POA: Diagnosis not present

## 2016-07-12 DIAGNOSIS — G4730 Sleep apnea, unspecified: Secondary | ICD-10-CM | POA: Insufficient documentation

## 2016-07-12 DIAGNOSIS — E7800 Pure hypercholesterolemia, unspecified: Secondary | ICD-10-CM | POA: Insufficient documentation

## 2016-07-12 DIAGNOSIS — F17210 Nicotine dependence, cigarettes, uncomplicated: Secondary | ICD-10-CM | POA: Insufficient documentation

## 2016-07-12 DIAGNOSIS — F419 Anxiety disorder, unspecified: Secondary | ICD-10-CM | POA: Diagnosis not present

## 2016-07-12 DIAGNOSIS — D125 Benign neoplasm of sigmoid colon: Secondary | ICD-10-CM | POA: Insufficient documentation

## 2016-07-12 DIAGNOSIS — I10 Essential (primary) hypertension: Secondary | ICD-10-CM | POA: Insufficient documentation

## 2016-07-12 DIAGNOSIS — E119 Type 2 diabetes mellitus without complications: Secondary | ICD-10-CM | POA: Insufficient documentation

## 2016-07-12 DIAGNOSIS — M5431 Sciatica, right side: Secondary | ICD-10-CM | POA: Diagnosis not present

## 2016-07-12 DIAGNOSIS — J449 Chronic obstructive pulmonary disease, unspecified: Secondary | ICD-10-CM | POA: Insufficient documentation

## 2016-07-12 DIAGNOSIS — K5730 Diverticulosis of large intestine without perforation or abscess without bleeding: Secondary | ICD-10-CM | POA: Insufficient documentation

## 2016-07-12 DIAGNOSIS — Z1211 Encounter for screening for malignant neoplasm of colon: Secondary | ICD-10-CM | POA: Insufficient documentation

## 2016-07-12 DIAGNOSIS — Z7984 Long term (current) use of oral hypoglycemic drugs: Secondary | ICD-10-CM | POA: Diagnosis not present

## 2016-07-12 DIAGNOSIS — Z86718 Personal history of other venous thrombosis and embolism: Secondary | ICD-10-CM | POA: Diagnosis not present

## 2016-07-12 DIAGNOSIS — Z6841 Body Mass Index (BMI) 40.0 and over, adult: Secondary | ICD-10-CM | POA: Diagnosis not present

## 2016-07-12 DIAGNOSIS — Z79899 Other long term (current) drug therapy: Secondary | ICD-10-CM | POA: Insufficient documentation

## 2016-07-12 DIAGNOSIS — Z86010 Personal history of colonic polyps: Secondary | ICD-10-CM | POA: Diagnosis not present

## 2016-07-12 DIAGNOSIS — M1990 Unspecified osteoarthritis, unspecified site: Secondary | ICD-10-CM | POA: Insufficient documentation

## 2016-07-12 DIAGNOSIS — M545 Low back pain: Secondary | ICD-10-CM | POA: Diagnosis present

## 2016-07-12 HISTORY — PX: COLONOSCOPY WITH PROPOFOL: SHX5780

## 2016-07-12 LAB — GLUCOSE, CAPILLARY: Glucose-Capillary: 121 mg/dL — ABNORMAL HIGH (ref 65–99)

## 2016-07-12 SURGERY — COLONOSCOPY WITH PROPOFOL
Anesthesia: General

## 2016-07-12 MED ORDER — SODIUM CHLORIDE 0.9 % IV SOLN
INTRAVENOUS | Status: DC
  Administered 2016-07-12: 12:00:00 via INTRAVENOUS

## 2016-07-12 MED ORDER — PIPERACILLIN-TAZOBACTAM 3.375 G IVPB 30 MIN
3.3750 g | Freq: Once | INTRAVENOUS | Status: AC
  Administered 2016-07-12: 3.375 g via INTRAVENOUS
  Filled 2016-07-12: qty 50

## 2016-07-12 MED ORDER — PREDNISONE 10 MG PO TABS: 10.0000 mg | ORAL_TABLET | Freq: Every day | ORAL | 0 refills | Status: DC

## 2016-07-12 MED ORDER — PHENYLEPHRINE HCL 10 MG/ML IJ SOLN
INTRAMUSCULAR | Status: DC | PRN
  Administered 2016-07-12: 100 ug via INTRAVENOUS

## 2016-07-12 MED ORDER — PROPOFOL 500 MG/50ML IV EMUL
INTRAVENOUS | Status: DC | PRN
  Administered 2016-07-12: 100 ug/kg/min via INTRAVENOUS

## 2016-07-12 MED ORDER — METHOCARBAMOL 500 MG PO TABS: 500.0000 mg | ORAL_TABLET | Freq: Four times a day (QID) | ORAL | 0 refills | Status: DC

## 2016-07-12 MED ORDER — PROPOFOL 500 MG/50ML IV EMUL
INTRAVENOUS | Status: AC
  Filled 2016-07-12: qty 50

## 2016-07-12 MED ORDER — KETOROLAC TROMETHAMINE 60 MG/2ML IM SOLN
60.0000 mg | Freq: Once | INTRAMUSCULAR | Status: AC
  Administered 2016-07-12: 60 mg via INTRAMUSCULAR
  Filled 2016-07-12: qty 2

## 2016-07-12 MED ORDER — SODIUM CHLORIDE 0.9 % IV SOLN
INTRAVENOUS | Status: DC
  Administered 2016-07-12: 1000 mL via INTRAVENOUS

## 2016-07-12 MED ORDER — LIDOCAINE HCL (PF) 1 % IJ SOLN
2.0000 mL | Freq: Once | INTRAMUSCULAR | Status: AC
  Administered 2016-07-12: 0.3 mL via INTRADERMAL
  Filled 2016-07-12: qty 2

## 2016-07-12 NOTE — Anesthesia Postprocedure Evaluation (Signed)
Anesthesia Post Note  Patient: Patty Leon  Procedure(s) Performed: Procedure(s) (LRB): COLONOSCOPY WITH PROPOFOL (N/A)  Patient location during evaluation: PACU Anesthesia Type: General Level of consciousness: awake and alert and oriented Pain management: pain level controlled Vital Signs Assessment: post-procedure vital signs reviewed and stable Respiratory status: spontaneous breathing Cardiovascular status: blood pressure returned to baseline Anesthetic complications: no     Last Vitals:  Vitals:   07/12/16 1234 07/12/16 1244  BP: 126/84 135/78  Pulse: 86 84  Resp: (!) 28 (!) 32  Temp:      Last Pain:  Vitals:   07/12/16 1216  TempSrc: Tympanic  PainSc:                  Kijana Cromie

## 2016-07-12 NOTE — Transfer of Care (Signed)
Immediate Anesthesia Transfer of Care Note  Patient: Patty Leon  Procedure(s) Performed: Procedure(s): COLONOSCOPY WITH PROPOFOL (N/A)  Patient Location: PACU  Anesthesia Type:General  Level of Consciousness: awake, alert  and oriented  Airway & Oxygen Therapy: Patient Spontanous Breathing and Patient connected to nasal cannula oxygen  Post-op Assessment: Report given to RN and Post -op Vital signs reviewed and stable  Post vital signs: Reviewed and stable  Last Vitals:  Vitals:   07/12/16 1051  BP: 133/82  Pulse: 83  Resp: 17  Temp: 36.1 C    Last Pain:  Vitals:   07/12/16 1051  TempSrc: Tympanic  PainSc: 3          Complications: No apparent anesthesia complications

## 2016-07-12 NOTE — Anesthesia Preprocedure Evaluation (Addendum)
Anesthesia Evaluation  Patient identified by MRN, date of birth, ID band  Reviewed: Allergy & Precautions, NPO status , Patient's Chart, lab work & pertinent test results  Airway Mallampati: II       Dental  (+) Upper Dentures   Pulmonary shortness of breath and with exertion, sleep apnea , COPD, Current Smoker,     + decreased breath sounds      Cardiovascular hypertension, Pt. on medications Normal cardiovascular exam     Neuro/Psych Anxiety    GI/Hepatic negative GI ROS, Neg liver ROS, GERD  Medicated,  Endo/Other  diabetes, Well Controlled, Type 2, Oral Hypoglycemic AgentsMorbid obesity  Renal/GU negative Renal ROS     Musculoskeletal  (+) Arthritis , Osteoarthritis,    Abdominal (+) + obese,   Peds negative pediatric ROS (+)  Hematology   Anesthesia Other Findings Past Medical History: No date: Anxiety No date: Arthritis No date: COPD (chronic obstructive pulmonary disease) (* No date: Diabetes mellitus without complication (HCC) No date: Diverticulosis No date: Fluid retention No date: GERD (gastroesophageal reflux disease) No date: History of blood clots No date: Hypercholesteremia No date: Hypertension No date: Panic attacks No date: Shortness of breath dyspnea No date: Sleep apnea     Comment: Use C-PAP with oxygen  Reproductive/Obstetrics                             Anesthesia Physical  Anesthesia Plan  ASA: III  Anesthesia Plan: General   Post-op Pain Management:    Induction: Intravenous  Airway Management Planned: Nasal Cannula  Additional Equipment:   Intra-op Plan:   Post-operative Plan:   Informed Consent: I have reviewed the patients History and Physical, chart, labs and discussed the procedure including the risks, benefits and alternatives for the proposed anesthesia with the patient or authorized representative who has indicated his/her understanding  and acceptance.     Plan Discussed with: CRNA  Anesthesia Plan Comments:         Anesthesia Quick Evaluation

## 2016-07-12 NOTE — Anesthesia Post-op Follow-up Note (Cosign Needed)
Anesthesia QCDR form completed.        

## 2016-07-12 NOTE — Op Note (Signed)
[facility] Gastroenterology Patient Name: Patty Leon Procedure Date: 07/12/2016 11:28 AM MRN: [id number] Account #: [id number] Date of Birth: June 17, 1960 Admit Type: Outpatient Age: 56 Room: [facility] ENDO ROOM 4 Gender: Female Note Status: Finalized Procedure:            Colonoscopy Indications:          High risk colon cancer surveillance: Personal history                        of colonic polyps Providers:            Manya Silvas, MD Referring MD:         , Perrin Maltese, MD (Referring MD) Medicines:            Propofol per Anesthesia Complications:        No immediate complications. Procedure:            Pre-Anesthesia Assessment:                       - After reviewing the risks and benefits, the patient                        was deemed in satisfactory condition to undergo the                        procedure.                       After obtaining informed consent, the colonoscope was                        passed under direct vision. Throughout the procedure,                        the patient's blood pressure, pulse, and oxygen                        saturations were monitored continuously. The                        Colonoscope was introduced through the anus and                        advanced to the the cecum, identified by appendiceal                        orifice and ileocecal valve. The colonoscopy was                        performed without difficulty. The patient tolerated the                        procedure well. The quality of the bowel preparation                        was good. Findings:      A small polyp was found in the sigmoid colon. The polyp was sessile. The       polyp was removed with a hot snare. Resection and retrieval were       complete.      A diminutive polyp was  found in the sigmoid colon. The polyp was       sessile. The polyp was removed with a cold snare. Resection and       retrieval were complete.      A small  polyp was found in the descending colon. The polyp was sessile.       The polyp was removed with a hot snare. Resection and retrieval were       complete. To prevent bleeding post-intervention, one hemostatic clip was       successfully placed. There was no bleeding during the procedure.      Three sessile polyps were found in the rectum. The polyps were       diminutive in size. These polyps were removed with a cold snare.       Resection and retrieval were complete.      A few small-mouthed diverticula were found in the sigmoid colon.      Internal hemorrhoids were found during endoscopy. The hemorrhoids were       small and Grade I (internal hemorrhoids that do not prolapse).      The exam was otherwise without abnormality. Impression:           - One small polyp in the sigmoid colon, removed with a                        hot snare. Resected and retrieved.                       - One diminutive polyp in the sigmoid colon, removed                        with a cold snare. Resected and retrieved.                       - One small polyp in the descending colon, removed with                        a hot snare. Resected and retrieved. Clip was placed.                       - Three diminutive polyps in the rectum, removed with a                        cold snare. Resected and retrieved.                       - Diverticulosis in the sigmoid colon.                       - Internal hemorrhoids.                       - The examination was otherwise normal. Recommendation:       - Await pathology results. Manya Silvas, MD 07/12/2016 12:14:58 PM This report has been signed electronically. Number of Addenda: 0 Note Initiated On: 07/12/2016 11:28 AM Scope Withdrawal Time: 0 hours 14 minutes 18 seconds  Total Procedure Duration: 0 hours 19 minutes 54 seconds       [employer]

## 2016-07-12 NOTE — ED Triage Notes (Addendum)
Pt in with co lower back pain since last night that radiates into buttocks and bilat posterior legs. Hx of back problems in the past. Pt also has hx of sciatica states feels the same.

## 2016-07-12 NOTE — H&P (Signed)
Primary Care Physician:  Danelle Berry, NP Primary Gastroenterologist:  Dr. Vira Agar  Pre-Procedure History & Physical: HPI:  Patty Leon is a 56 y.o. female is here for an colonoscopy.   Past Medical History:  Diagnosis Date  . Anxiety   . Arthritis   . COPD (chronic obstructive pulmonary disease) ([place])   . Diabetes mellitus without complication ([place])   . Diverticulosis   . Fluid retention   . GERD (gastroesophageal reflux disease)   . History of blood clots   . Hypercholesteremia   . Hypertension   . Panic attacks   . Shortness of breath dyspnea   . Sleep apnea    Use C-PAP with oxygen    Past Surgical History:  Procedure Laterality Date  . BILATERAL CARPAL TUNNEL RELEASE    . COLONOSCOPY    . COLONOSCOPY N/A 01/22/2015   Procedure: COLONOSCOPY;  Surgeon: Josefine Class, MD;  Location: [facility] ENDOSCOPY;  Service: Endoscopy;  Laterality: N/A;  . JOINT REPLACEMENT Left 09/2015  . KNEE ARTHROSCOPY Left   . KNEE CLOSED REDUCTION Left 11/04/2015   Procedure: CLOSED MANIPULATION KNEE;  Surgeon: Hessie Knows, MD;  Location: [facility] ORS;  Service: Orthopedics;  Laterality: Left;  . KNEE CLOSED REDUCTION Left 04/04/2016   Procedure: CLOSED MANIPULATION KNEE;  Surgeon: Hessie Knows, MD;  Location: [facility] ORS;  Service: Orthopedics;  Laterality: Left;  . SHOULDER ARTHROSCOPY W/ ROTATOR CUFF REPAIR Right   . TOTAL KNEE ARTHROPLASTY Left 10/07/2015   Procedure: TOTAL KNEE ARTHROPLASTY;  Surgeon: Hessie Knows, MD;  Location: [facility] ORS;  Service: Orthopedics;  Laterality: Left;  . TOTAL KNEE REVISION Left 02/08/2016   Procedure: TOTAL KNEE REVISION;  Surgeon: Hessie Knows, MD;  Location: [facility] ORS;  Service: Orthopedics;  Laterality: Left;  . TUBAL LIGATION      Prior to Admission medications   Medication Sig Start Date End Date Taking? Authorizing Provider  amLODipine (NORVASC) 5 MG tablet Take 5 mg by mouth every morning.     Historical Provider, MD  Cholecalciferol (VITAMIN D)  2000 units CAPS Take 2,000 Units by mouth daily.    Historical Provider, MD  docusate sodium (COLACE) 100 MG capsule Take 100 mg by mouth daily. 08/12/15   Historical Provider, MD  GRALISE 600 MG TABS Take 1,800 mg by mouth daily as needed (for pain.).  11/25/15   Historical Provider, MD  hydrochlorothiazide (HYDRODIURIL) 25 MG tablet Take 25 mg by mouth daily. 09/13/15   Historical Provider, MD  metFORMIN (GLUCOPHAGE) 500 MG tablet Take 500 mg by mouth 2 (two) times daily. 07/26/15   Historical Provider, MD  methocarbamol (ROBAXIN) 750 MG tablet Take 750 mg by mouth 4 (four) times daily as needed for muscle spasms. 03/14/16   Historical Provider, MD  metoprolol (LOPRESSOR) 50 MG tablet Take 50 mg by mouth every morning.  08/29/15   Historical Provider, MD  NON FORMULARY Take 2 puffs by mouth 2 (two) times daily. Study medication #1    Historical Provider, MD  NON FORMULARY Inhale 2 puffs into the lungs every 4 (four) hours as needed (wheezing or shortness of breath). Study Medication #2    Historical Provider, MD  omeprazole (PRILOSEC) 40 MG capsule Take 40 mg by mouth every morning.  07/27/15   Historical Provider, MD  Oxycodone HCl 20 MG TABS Take 20 mg by mouth every 6 (six) hours as needed. 02/29/16   Historical Provider, MD  polyethylene glycol powder (GLYCOLAX/MIRALAX) powder Take 17 g by mouth daily as needed  for mild constipation.  10/27/15   Historical Provider, MD  roflumilast (DALIRESP) 500 MCG TABS tablet Take 500 mcg by mouth every morning.     Historical Provider, MD  rosuvastatin (CRESTOR) 10 MG tablet Take 10 mg by mouth every evening. 11/05/13   Historical Provider, MD  XARELTO 20 MG TABS tablet Take 20 mg by mouth daily. 03/01/16   Historical Provider, MD  zolpidem (AMBIEN) 10 MG tablet Take 10 mg by mouth at bedtime. 09/16/15   Historical Provider, MD    Allergies as of 04/05/2016 - Review Complete 04/04/2016  Allergen Reaction Noted  . Celebrex [celecoxib] Swelling 10/23/2014  . Dilaudid  [hydromorphone] Itching 04/04/2016    Family History  Problem Relation Age of Onset  . Lung cancer Mother   . Ovarian cancer Paternal Grandmother     Social History   Social History  . Marital status: Married    Spouse name: N/A  . Number of children: N/A  . Years of education: N/A   Occupational History  . Not on file.   Social History Main Topics  . Smoking status: Current Every Day Smoker    Packs/day: 0.50    Years: 30.00    Types: Cigarettes  . Smokeless tobacco: Never Used  . Alcohol use No  . Drug use: Yes    Types: Oxycodone     Comment: as prescribed by MD  . Sexual activity: Yes   Other Topics Concern  . Not on file   Social History Narrative  . No narrative on file    Review of Systems: See HPI, otherwise negative ROS  Physical Exam: BP 133/82   Pulse 83   Temp 97 F (36.1 C) (Tympanic)   Resp 17   Ht 5\' 2"  (1.575 m)   Wt 120.2 kg (265 lb)   LMP 04/24/2014 (Approximate)   SpO2 95%   BMI 48.47 kg/m  General:   Alert,  pleasant and cooperative in NAD Head:  Normocephalic and atraumatic. Neck:  Supple; no masses or thyromegaly. Lungs:  Clear throughout to auscultation.    Heart:  Regular rate and rhythm. Abdomen:  Soft, nontender and nondistended. Normal bowel sounds, without guarding, and without rebound.   Neurologic:  Alert and  oriented x4;  grossly normal neurologically.  Impression/Plan: Patty Leon is here for an colonoscopy to be performed for [facility] colon polyps.  Risks, benefits, limitations, and alternatives regarding  colonoscopy have been reviewed with the patient.  Questions have been answered.  All parties agreeable.   Gaylyn Cheers, MD  07/12/2016, 11:36 AM

## 2016-07-12 NOTE — ED Provider Notes (Signed)
[facility] Emergency Department Provider Note  ____________________________________________  Time seen: Approximately 11:18 PM  I have reviewed the triage vital signs and the nursing notes.   HISTORY  Chief Complaint Back Pain    HPI Patty Leon is a 56 y.o. female who presents to emergency department complaining of flare for sciatica. Patient states that intermittently she will have increase in her sciatica symptoms. She is on chronic pain medication for multiple pain complaints. Patient reports that generally shot of Toradol emergency department followed by steroid course is very effective in reducing her sciatica symptoms. She denies any bowel or bladder retention, saddle anesthesia, paresthesias. No recent trauma. No urinary symptoms. Patient reports the pain originates in lower back and radiates to bilateral sciatic notch regions. Pain is sharp and burning.   Past Medical History:  Diagnosis Date  . Anxiety   . Arthritis   . COPD (chronic obstructive pulmonary disease) ([place])   . Diabetes mellitus without complication ([place])   . Diverticulosis   . Fluid retention   . GERD (gastroesophageal reflux disease)   . History of blood clots   . Hypercholesteremia   . Hypertension   . Panic attacks   . Shortness of breath dyspnea   . Sleep apnea    Use C-PAP with oxygen    Patient Active Problem List   Diagnosis Date Noted  . 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 Primary localized osteoarthritis of left knee 10/07/2015    Past Surgical History:  Procedure Laterality Date  . BILATERAL CARPAL TUNNEL RELEASE    . COLONOSCOPY    . COLONOSCOPY N/A 01/22/2015   Procedure: COLONOSCOPY;  Surgeon: Josefine Class, MD;  Location: [facility] ENDOSCOPY;  Service: Endoscopy;  Laterality: N/A;  . JOINT REPLACEMENT Left 09/2015  . KNEE ARTHROSCOPY Left   . KNEE CLOSED REDUCTION Left 11/04/2015   Procedure: CLOSED MANIPULATION KNEE;  Surgeon: Hessie Knows, MD;  Location: [facility] ORS;  Service: Orthopedics;  Laterality: Left;  . KNEE CLOSED REDUCTION Left 04/04/2016   Procedure: CLOSED MANIPULATION KNEE;  Surgeon: Hessie Knows, MD;  Location: [facility] ORS;  Service: Orthopedics;  Laterality: Left;  . SHOULDER ARTHROSCOPY W/ ROTATOR CUFF REPAIR Right   . TOTAL KNEE ARTHROPLASTY Left 10/07/2015   Procedure: TOTAL KNEE ARTHROPLASTY;  Surgeon: Hessie Knows, MD;  Location: [facility] ORS;  Service: Orthopedics;  Laterality: Left;  . TOTAL KNEE REVISION Left 02/08/2016   Procedure: TOTAL KNEE REVISION;  Surgeon: Hessie Knows, MD;  Location: [facility] ORS;  Service: Orthopedics;  Laterality: Left;  . TUBAL LIGATION      Prior to Admission medications   Medication Sig Start Date End Date Taking? Authorizing Provider  amLODipine (NORVASC) 5 MG tablet Take 5 mg by mouth every morning.     Historical Provider, MD  Cholecalciferol (VITAMIN D) 2000 units CAPS Take 2,000 Units by mouth daily.    Historical Provider, MD  docusate sodium (COLACE) 100 MG capsule Take 100 mg by mouth daily. 08/12/15   Historical Provider, MD  GRALISE 600 MG TABS Take 1,800 mg by mouth daily as needed (for pain.).  11/25/15   Historical Provider, MD  hydrochlorothiazide (HYDRODIURIL) 25 MG tablet Take 25 mg by mouth daily. 09/13/15   Historical Provider, MD  metFORMIN (GLUCOPHAGE) 500 MG tablet Take 500 mg by mouth 2 (two) times daily. 07/26/15   Historical Provider, MD  methocarbamol (ROBAXIN) 500 MG tablet Take 1 tablet (500 mg total) by mouth 4 (four) times daily. 07/12/16  Charline Bills Dorina Ribaudo, PA-C  metoprolol (LOPRESSOR) 50 MG tablet Take 50 mg by mouth every morning.  08/29/15   Historical Provider, MD  NON FORMULARY Take 2 puffs by mouth 2 (two) times daily. Study medication #1    Historical Provider, MD  NON FORMULARY Inhale 2 puffs into the lungs every 4 (four) hours as needed (wheezing or shortness of breath). Study Medication #2    Historical Provider, MD  omeprazole (PRILOSEC) 40 MG capsule  Take 40 mg by mouth every morning.  07/27/15   Historical Provider, MD  Oxycodone HCl 20 MG TABS Take 20 mg by mouth every 6 (six) hours as needed. 02/29/16   Historical Provider, MD  polyethylene glycol powder (GLYCOLAX/MIRALAX) powder Take 17 g by mouth daily as needed for mild constipation.  10/27/15   Historical Provider, MD  predniSONE (DELTASONE) 10 MG tablet Take 1 tablet (10 mg total) by mouth daily. 07/12/16   Charline Bills Alaria Oconnor, PA-C  roflumilast (DALIRESP) 500 MCG TABS tablet Take 500 mcg by mouth every morning.     Historical Provider, MD  rosuvastatin (CRESTOR) 10 MG tablet Take 10 mg by mouth every evening. 11/05/13   Historical Provider, MD  XARELTO 20 MG TABS tablet Take 20 mg by mouth daily. 03/01/16   Historical Provider, MD  zolpidem (AMBIEN) 10 MG tablet Take 10 mg by mouth at bedtime. 09/16/15   Historical Provider, MD    Allergies Celebrex [celecoxib] and Dilaudid [hydromorphone]  Family History  Problem Relation Age of Onset  . Lung cancer Mother   . Ovarian cancer Paternal Grandmother     Social History Social History  Substance Use Topics  . Smoking status: Current Every Day Smoker    Packs/day: 0.50    Years: 30.00    Types: Cigarettes  . Smokeless tobacco: Never Used  . Alcohol use No     Review of Systems  Constitutional: No fever/chills Cardiovascular: no chest pain. Respiratory: no cough. No SOB. Gastrointestinal: No abdominal pain.  No nausea, no vomiting.  No diarrhea.  No constipation. Genitourinary: Negative for dysuria. No hematuria Musculoskeletal: Positive for lower back pain with radicular symptoms bilaterally. Skin: Negative for rash, abrasions, lacerations, ecchymosis. Neurological: Negative for headaches, focal weakness or numbness. 10-point ROS otherwise negative.  ____________________________________________   PHYSICAL EXAM:  VITAL SIGNS: ED Triage Vitals  Enc Vitals Group     BP 07/12/16 2301 130/67     Pulse Rate 07/12/16 2259  (!) 108     Resp 07/12/16 2259 20     Temp 07/12/16 2259 98.7 F (37.1 C)     Temp Source 07/12/16 2259 Oral     SpO2 07/12/16 2259 95 %     Weight 07/12/16 2259 265 lb (120.2 kg)     Height 07/12/16 2259 5\' 2"  (1.575 m)     Head Circumference --      Peak Flow --      Pain Score 07/12/16 2258 10     Pain Loc --      Pain Edu? --      Excl. in [place]? --      Constitutional: Alert and oriented. Well appearing and in no acute distress. Eyes: Conjunctivae are normal. PERRL. EOMI. Head: Atraumatic. ENeck: No stridor.    Cardiovascular: Normal rate, regular rhythm. Normal S1 and S2.  Good peripheral circulation. Respiratory: Normal respiratory effort without tachypnea or retractions. Lungs CTAB. Good air entry to the bases with no decreased or absent breath sounds. Gastrointestinal: Bowel sounds 4  quadrants. Soft and nontender to palpation. No guarding or rigidity. No palpable masses. No distention. No CVA tenderness. Musculoskeletal: Full range of motion to all extremities. No gross deformities appreciated. No diaphoresis but upon inspection. Full range of motion. Patient is mildly tender palpation in bilateral paraspinal muscle groups and more so in bilateral sciatic notches. Negative straight leg raise bilaterally. Dorsalis pedis pulse intact bilateral lower extremities. Sensation intact and equal bilateral lower extremities. Neurologic:  Normal speech and language. No gross focal neurologic deficits are appreciated.  Skin:  Skin is warm, dry and intact. No rash noted. Psychiatric: Mood and affect are normal. Speech and behavior are normal. Patient exhibits appropriate insight and judgement.   ____________________________________________   LABS (all labs ordered are listed, but only abnormal results are displayed)  Labs Reviewed - No data to display ____________________________________________  EKG   ____________________________________________  RADIOLOGY   No results  found.  ____________________________________________    PROCEDURES  Procedure(s) performed:    Procedures    Medications  ketorolac (TORADOL) injection 60 mg (not administered)     ____________________________________________   INITIAL IMPRESSION / ASSESSMENT AND PLAN / ED COURSE  Pertinent labs & imaging results that were available during my care of the patient were reviewed by me and considered in my medical decision making (see chart for details).  Review of the [place] CSRS was performed in accordance of the [place] prior to dispensing any controlled drugs.     Patient's diagnosis is consistent with Sciatica. No trauma. No urinary symptoms. No indication for labs or imaging at this time. Patient request Toradol shot followed by prescription for steroids.. Patient is given an injection of Toradol. Prescribed steroids and muscle relaxer. Patient is given ED precautions to return to the ED for any worsening or new symptoms.     ____________________________________________  FINAL CLINICAL IMPRESSION(S) / ED DIAGNOSES  Final diagnoses:  Bilateral sciatica      NEW MEDICATIONS STARTED DURING THIS VISIT:  New Prescriptions   METHOCARBAMOL (ROBAXIN) 500 MG TABLET    Take 1 tablet (500 mg total) by mouth 4 (four) times daily.   PREDNISONE (DELTASONE) 10 MG TABLET    Take 1 tablet (10 mg total) by mouth daily.        This chart was dictated using voice recognition software/Dragon. Despite best efforts to proofread, errors can occur which can change the meaning. Any change was purely unintentional.    Darletta Moll, PA-C 07/12/16 2329    Harvest Dark, MD 07/15/16 [phone]

## 2016-07-13 ENCOUNTER — Encounter: Payer: Self-pay | Admitting: Unknown Physician Specialty

## 2016-07-14 LAB — SURGICAL PATHOLOGY

## 2016-07-18 ENCOUNTER — Other Ambulatory Visit: Payer: Self-pay | Admitting: Internal Medicine

## 2016-07-18 ENCOUNTER — Other Ambulatory Visit: Payer: Self-pay | Admitting: Nurse Practitioner

## 2016-07-27 ENCOUNTER — Other Ambulatory Visit: Payer: Self-pay | Admitting: Nurse Practitioner

## 2016-07-27 DIAGNOSIS — Z1231 Encounter for screening mammogram for malignant neoplasm of breast: Secondary | ICD-10-CM

## 2016-09-06 ENCOUNTER — Ambulatory Visit
Admission: RE | Admit: 2016-09-06 | Discharge: 2016-09-06 | Disposition: A | Discharge status: Home / Self Care | Payer: Medicaid Other | Source: Ambulatory Visit | Attending: Nurse Practitioner | Admitting: Nurse Practitioner

## 2016-09-06 DIAGNOSIS — Z1231 Encounter for screening mammogram for malignant neoplasm of breast: Secondary | ICD-10-CM | POA: Diagnosis not present

## 2016-10-11 ENCOUNTER — Emergency Department
Admission: EM | Admit: 2016-10-11 | Discharge: 2016-10-11 | Disposition: A | Discharge status: Home / Self Care | Payer: Medicaid Other | Attending: Emergency Medicine | Admitting: Emergency Medicine

## 2016-10-11 ENCOUNTER — Emergency Department: Payer: Medicaid Other

## 2016-10-11 DIAGNOSIS — M542 Cervicalgia: Secondary | ICD-10-CM | POA: Diagnosis present

## 2016-10-11 DIAGNOSIS — J449 Chronic obstructive pulmonary disease, unspecified: Secondary | ICD-10-CM | POA: Diagnosis not present

## 2016-10-11 DIAGNOSIS — Z7984 Long term (current) use of oral hypoglycemic drugs: Secondary | ICD-10-CM | POA: Diagnosis not present

## 2016-10-11 DIAGNOSIS — F17210 Nicotine dependence, cigarettes, uncomplicated: Secondary | ICD-10-CM | POA: Insufficient documentation

## 2016-10-11 DIAGNOSIS — E119 Type 2 diabetes mellitus without complications: Secondary | ICD-10-CM | POA: Insufficient documentation

## 2016-10-11 DIAGNOSIS — I10 Essential (primary) hypertension: Secondary | ICD-10-CM | POA: Diagnosis not present

## 2016-10-11 DIAGNOSIS — Z96652 Presence of left artificial knee joint: Secondary | ICD-10-CM | POA: Diagnosis not present

## 2016-10-11 DIAGNOSIS — M25512 Pain in left shoulder: Secondary | ICD-10-CM | POA: Insufficient documentation

## 2016-10-11 LAB — CBC
HCT: 42.1 % (ref 35.0–47.0)
Hemoglobin: 14.2 g/dL (ref 12.0–16.0)
MCH: 29.5 pg (ref 26.0–34.0)
MCHC: 33.8 g/dL (ref 32.0–36.0)
MCV: 87.4 fL (ref 80.0–100.0)
Platelets: 284 10*3/uL (ref 150–440)
RBC: 4.82 MIL/uL (ref 3.80–5.20)
RDW: 14.5 % (ref 11.5–14.5)
WBC: 12.7 10*3/uL — ABNORMAL HIGH (ref 3.6–11.0)

## 2016-10-11 LAB — BASIC METABOLIC PANEL
Anion gap: 11 (ref 5–15)
BUN: 11 mg/dL (ref 6–20)
CO2: 33 mmol/L — ABNORMAL HIGH (ref 22–32)
CREATININE: 0.57 mg/dL (ref 0.44–1.00)
Calcium: 9.6 mg/dL (ref 8.9–10.3)
Chloride: 90 mmol/L — ABNORMAL LOW (ref 101–111)
GFR calc Af Amer: >60 mL/min (ref >60)
GLUCOSE: 163 mg/dL — AB (ref 65–99)
POTASSIUM: 3.3 mmol/L — AB (ref 3.5–5.1)
SODIUM: 134 mmol/L — AB (ref 135–145)

## 2016-10-11 LAB — TROPONIN I

## 2016-10-11 MED ORDER — LIDOCAINE 5 % EX PTCH: 1.0000 | MEDICATED_PATCH | Freq: Two times a day (BID) | CUTANEOUS | 0 refills | Status: AC

## 2016-10-11 MED ORDER — LIDOCAINE 5 % EX PTCH
1.0000 | MEDICATED_PATCH | CUTANEOUS | Status: DC
  Administered 2016-10-11: 1 via TRANSDERMAL
  Filled 2016-10-11: qty 1

## 2016-10-11 MED ORDER — KETOROLAC TROMETHAMINE 30 MG/ML IJ SOLN
30.0000 mg | Freq: Once | INTRAMUSCULAR | Status: AC
  Administered 2016-10-11: 30 mg via INTRAVENOUS
  Filled 2016-10-11: qty 1

## 2016-10-11 MED ORDER — MIDAZOLAM HCL 5 MG/5ML IJ SOLN
INTRAMUSCULAR | Status: AC
  Filled 2016-10-11: qty 5

## 2016-10-11 MED ORDER — TRAMADOL HCL 50 MG PO TABS
50.0000 mg | ORAL_TABLET | Freq: Once | ORAL | Status: AC
  Administered 2016-10-11: 50 mg via ORAL
  Filled 2016-10-11: qty 1

## 2016-10-11 MED ORDER — SODIUM CHLORIDE 0.9 % IV BOLUS (SEPSIS)
1000.0000 mL | Freq: Once | INTRAVENOUS | Status: AC
  Administered 2016-10-11: 1000 mL via INTRAVENOUS

## 2016-10-11 MED ORDER — MORPHINE SULFATE (PF) 4 MG/ML IV SOLN
4.0000 mg | Freq: Once | INTRAVENOUS | Status: AC
  Administered 2016-10-11: 4 mg via INTRAVENOUS
  Filled 2016-10-11: qty 1

## 2016-10-11 MED ORDER — IPRATROPIUM-ALBUTEROL 0.5-2.5 (3) MG/3ML IN SOLN
3.0000 mL | Freq: Once | RESPIRATORY_TRACT | Status: AC
  Administered 2016-10-11: 3 mL via RESPIRATORY_TRACT
  Filled 2016-10-11: qty 3

## 2016-10-11 MED ORDER — MIDAZOLAM HCL 2 MG/2ML IJ SOLN
2.0000 mg | Freq: Once | INTRAMUSCULAR | Status: AC
  Administered 2016-10-11: 2 mg via INTRAVENOUS

## 2016-10-11 NOTE — Discharge Instructions (Signed)
It appears that you have a bone spur causing impingement on the rotator cuff. Please follow-up with orthopedic surgery for further evaluation. Please continue taking her medication and icing your shoulder. Please return with any worsening conditions.

## 2016-10-11 NOTE — ED Provider Notes (Signed)
[facility] Emergency Department Provider Note   ____________________________________________   First MD Initiated Contact with Patient 10/11/16 [phone]     (approximate)  I have reviewed the triage vital signs and the nursing notes.   HISTORY  Chief Complaint Torticollis    HPI Patty Leon is a 56 y.o. female who comes into the hospital today with some pain in her left shoulder. She reports this started in her head and neck on Sunday morning. She thought maybe she had a crick in her neck and tried to work it out. Monday she felt improved but reports that Tuesday he seemed to be more in her left shoulder and arm. The patient reports that she thinks it's a nerve but the pain has been getting worse and worse. Her fingers on her left hand is swollen. The patient has been taking oxycodone which she normally takes for her back but it has not been helping. The patient reports that she rubbed some muscle rub and it but the pain is persistent. The patient rates her pain a 10 out of 10 in intensity. It's worse when she moves. She's never had pain like this before. She describes the pain as throbbing. When asked if she's had any pain in her chest the patient states that she's had a little bit of pain in her chest. She also has had some shortness of breath but she does have a history of COPD. The patient denies any nausea or vomiting. She's had no dizziness or lightheadedness. The patient reports that she couldn't tolerate the pain any longer so she came into the hospital for evaluation.   Past Medical History:  Diagnosis Date  . Anxiety   . Arthritis   . COPD (chronic obstructive pulmonary disease) ([place])   . Diabetes mellitus without complication ([place])   . Diverticulosis   . Fluid retention   . GERD (gastroesophageal reflux disease)   . History of blood clots   . Hypercholesteremia   . Hypertension   . Panic attacks   . Shortness of breath dyspnea   . Sleep apnea     Use C-PAP with oxygen    Patient Active Problem List   Diagnosis Date Noted  . 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 Primary localized osteoarthritis of left knee 10/07/2015    Past Surgical History:  Procedure Laterality Date  . BILATERAL CARPAL TUNNEL RELEASE    . COLONOSCOPY    . COLONOSCOPY N/A 01/22/2015   Procedure: COLONOSCOPY;  Surgeon: Josefine Class, MD;  Location: [facility] ENDOSCOPY;  Service: Endoscopy;  Laterality: N/A;  . COLONOSCOPY WITH PROPOFOL N/A 07/12/2016   Procedure: COLONOSCOPY WITH PROPOFOL;  Surgeon: Manya Silvas, MD;  Location[facility] ENDOSCOPY;  Service: Endoscopy;  Laterality: N/A;  . JOINT REPLACEMENT Left 09/2015  . KNEE ARTHROSCOPY Left   . KNEE CLOSED REDUCTION Left 11/04/2015   Procedure: CLOSED MANIPULATION KNEE;  Surgeon: Hessie Knows, MD;  Location: [facility] ORS;  Service: Orthopedics;  Laterality: Left;  . KNEE CLOSED REDUCTION Left 04/04/2016   Procedure: CLOSED MANIPULATION KNEE;  Surgeon: Hessie Knows, MD;  Location: [facility] ORS;  Service: Orthopedics;  Laterality: Left;  . SHOULDER ARTHROSCOPY W/ ROTATOR CUFF REPAIR Right   . TOTAL KNEE ARTHROPLASTY Left 10/07/2015   Procedure: TOTAL KNEE ARTHROPLASTY;  Surgeon: Hessie Knows, MD;  Location: [facility] ORS;  Service: Orthopedics;  Laterality: Left;  . TOTAL KNEE REVISION Left 02/08/2016   Procedure: TOTAL KNEE REVISION;  Surgeon: Hessie Knows, MD;  Location: [facility] ORS;  Service: Orthopedics;  Laterality: Left;  . TUBAL LIGATION      Prior to Admission medications   Medication Sig Start Date End Date Taking? Authorizing Provider  amLODipine (NORVASC) 5 MG tablet Take 5 mg by mouth every morning.     [provider]  Cholecalciferol (VITAMIN D) 2000 units CAPS Take 2,000 Units by mouth daily.    [provider]  docusate sodium (COLACE) 100 MG capsule Take 100 mg by mouth daily. 08/12/15   [provider]  GRALISE 600 MG TABS Take 1,800 mg by mouth daily as needed (for  pain.).  11/25/15   [provider]  hydrochlorothiazide (HYDRODIURIL) 25 MG tablet Take 25 mg by mouth daily. 09/13/15   [provider]  metFORMIN (GLUCOPHAGE) 500 MG tablet Take 500 mg by mouth 2 (two) times daily. 07/26/15   [provider]  methocarbamol (ROBAXIN) 500 MG tablet Take 1 tablet (500 mg total) by mouth 4 (four) times daily. 07/12/16   Cuthriell, Charline Bills, PA-C  metoprolol (LOPRESSOR) 50 MG tablet Take 50 mg by mouth every morning.  08/29/15   [provider]  NON FORMULARY Take 2 puffs by mouth 2 (two) times daily. Study medication #1    [provider]  NON FORMULARY Inhale 2 puffs into the lungs every 4 (four) hours as needed (wheezing or shortness of breath). Study Medication #2    [provider]  omeprazole (PRILOSEC) 40 MG capsule Take 40 mg by mouth every morning.  07/27/15   [provider]  Oxycodone HCl 20 MG TABS Take 20 mg by mouth every 6 (six) hours as needed. 02/29/16   [provider]  polyethylene glycol powder (GLYCOLAX/MIRALAX) powder Take 17 g by mouth daily as needed for mild constipation.  10/27/15   [provider]  predniSONE (DELTASONE) 10 MG tablet Take 1 tablet (10 mg total) by mouth daily. 07/12/16   Cuthriell, Charline Bills, PA-C  roflumilast (DALIRESP) 500 MCG TABS tablet Take 500 mcg by mouth every morning.     [provider]  rosuvastatin (CRESTOR) 10 MG tablet Take 10 mg by mouth every evening. 11/05/13   [provider]  XARELTO 20 MG TABS tablet Take 20 mg by mouth daily. 03/01/16   [provider]  zolpidem (AMBIEN) 10 MG tablet Take 10 mg by mouth at bedtime. 09/16/15   [provider]    Allergies Celebrex [celecoxib] and Dilaudid [hydromorphone]  Family History  Problem Relation Age of Onset  . Lung cancer Mother   . Ovarian cancer Paternal Grandmother   . Breast cancer Neg Hx     Social History Social History  Substance Use  Topics  . Smoking status: Current Every Day Smoker    Packs/day: 0.50    Years: 30.00    Types: Cigarettes  . Smokeless tobacco: Never Used  . Alcohol use No    Review of Systems  Constitutional: No fever/chills Eyes: No visual changes. ENT: No sore throat. Cardiovascular:  chest pain. Respiratory:  shortness of breath. Gastrointestinal: No abdominal pain.  No nausea, no vomiting.  No diarrhea.  No constipation. Genitourinary: Negative for dysuria. Musculoskeletal: Left neck, shoulder and arm pain Skin: Negative for rash. Neurological: Negative for headaches, focal weakness or numbness.   ____________________________________________   PHYSICAL EXAM:  VITAL SIGNS: ED Triage Vitals  Enc Vitals Group     BP 10/11/16 0618 (!) 142/65     Pulse Rate 10/11/16 0616 (!) 114  Resp 10/11/16 0616 20     Temp 10/11/16 0618 99.6 F (37.6 C)     Temp Source 10/11/16 0618 Oral     SpO2 10/11/16 0618 (!) 87 %     Weight 10/11/16 0616 280 lb (127 kg)     Height 10/11/16 0616 5\' 2"  (1.575 m)     Head Circumference --      Peak Flow --      Pain Score 10/11/16 0616 10     Pain Loc --      Pain Edu? --      Excl. in [place]? --     Constitutional: Alert and oriented. Well appearing and in Moderate distress. Eyes: Conjunctivae are normal. PERRL. EOMI. Head: Atraumatic. Nose: No congestion/rhinnorhea. Mouth/Throat: Mucous membranes are moist.  Oropharynx non-erythematous. Neck: Tenderness to palpation along the lateral portion of the neck and between the neck and the shoulder. Cardiovascular: Normal rate, regular rhythm. Grossly normal heart sounds.  Good peripheral circulation. Respiratory: Normal respiratory effort.  No retractions. Diminished breath sounds throughout. Gastrointestinal: Soft and nontender. No distention. Positive bowel sounds Musculoskeletal: No lower extremity tenderness nor edema.  Pain with any range of motion of the left arm, pain from the neck through the  shoulder and over the deltoid. Neurologic:  Normal speech and language.  Skin:  Skin is warm, dry and intact.  Psychiatric: Mood and affect are normal.   ____________________________________________   LABS (all labs ordered are listed, but only abnormal results are displayed)  Labs Reviewed  CBC - Abnormal; Notable for the following:       Result Value   WBC 12.7 (*)    All other components within normal limits  BASIC METABOLIC PANEL - Abnormal; Notable for the following:    Sodium 134 (*)    Potassium 3.3 (*)    Chloride 90 (*)    CO2 33 (*)    Glucose, Bld 163 (*)    All other components within normal limits  TROPONIN I   ____________________________________________  EKG  ED ECG REPORT I, Loney Hering, the attending physician, personally viewed and interpreted this ECG.   Date: 10/11/2016  EKG Time: 0745  Rate: 108  Rhythm: sinus tachycardia  Axis: normal  Intervals:none  ST&T Change: none  ____________________________________________  RADIOLOGY  Dg Chest 2 View  Result Date: 10/11/2016 CLINICAL DATA:  The patient reports awakening with chest, left shoulder, and neck pain three days ago. Current smoker. History of hypertension, diabetes, and previous episodes of pneumonia. EXAM: CHEST  2 VIEW COMPARISON:  Chest x-ray of June 11, 2014 FINDINGS: The lungs are mildly hyperinflated. The interstitial markings are coarse though stable. There is no pneumothorax, pneumomediastinum, or pleural effusion. There is no alveolar infiltrate. The heart is top-normal in size. The pulmonary vascularity is not engorged. The mediastinum is normal in width. There is mild multilevel degenerative disc disease of the thoracic spine. IMPRESSION: Chronic bronchitic -smoking related changes, stable. Borderline cardiomegaly. No pneumonia nor CHF. Electronically Signed   By: David  Martinique M.D.   On: 10/11/2016 07:49   Dg Cervical Spine 2-3 Views  Result Date: 10/11/2016 CLINICAL DATA:   Awakened 3 days ago with neck, left shoulder, and chest pain. EXAM: CERVICAL SPINE - 2-3 VIEW COMPARISON:  None in PACs FINDINGS: The cervical vertebral bodies are preserved in height. There is mild degenerative narrowing of the C5-6 and C6-7 discs with small anterior endplate osteophytes. There is no perched facet or spinous process fracture. The odontoid  is intact. The prevertebral soft tissue spaces are normal. IMPRESSION: There is degenerative disc disease at C5-6 and C6-7. If the patient's symptoms appear to be radicular in nature, MRI of the cervical spine would be a useful next imaging step. Electronically Signed   By: David  Martinique M.D.   On: 10/11/2016 07:52   Dg Shoulder Left  Result Date: 10/11/2016 CLINICAL DATA:  O awakened 3 days ago with chest and shoulder and neck pain. No previous injuries. EXAM: LEFT SHOULDER - 2+ VIEW COMPARISON:  Chest x-ray of June 11, 2014 which included a portion of the left shoulder. FINDINGS: The bones are subjectively adequately mineralized. The glenohumeral joint space appears normal. A large spur arises from the inferior aspect of the distal clavicle. The [facility] joint space is reasonably well-maintained. The bony glenoid and humeral head exhibit no acute abnormalities. IMPRESSION: Large spur arising from the inferior aspect of the distal clavicle which may be impacting the rotator cuff. There is no acute bony abnormality. Electronically Signed   By: David  [place] M.D.   On: 10/11/2016 07:50    ____________________________________________   PROCEDURES  Procedure(s) performed: None  Procedures  Critical Care performed: No  ____________________________________________   INITIAL IMPRESSION / ASSESSMENT AND PLAN / ED COURSE  Pertinent labs & imaging results that were available during my care of the patient were reviewed by me and considered in my medical decision making (see chart for details).  This is a 56 year old female who comes into the hospital  today with some left-sided shoulder pain. This is been progressively getting worse since Sunday. She has been taking medication has not helped. I will give the patient a shot of Toradol as well as some tramadol Valium and a Lidoderm patch. I will also check some blood work on the patient since she's been having some shortness of breath and has some chest discomfort. I will send the patient for an x-ray of her chest and shoulder and cervical spine. I will reassess the patient.     The patient has a large bony spur at the inferior aspect of her distal clavicle which may be hinging on her rotator cuff. This is likely was causing the patient's symptoms. I will give the patient liter of normal saline as she does still have some tachycardia and we will reassess her oxygen saturation. The patient's care was signed out to [facility] who will reassess the patient. Since the patient reports that she had chest pain yesterday I feel that one troponin is appropriate. The patient's chest pain is not bothering her at this time. ____________________________________________   FINAL CLINICAL IMPRESSION(S) / ED DIAGNOSES  Final diagnoses:  Acute pain of left shoulder  Neck pain      NEW MEDICATIONS STARTED DURING THIS VISIT:  New Prescriptions   No medications on file     Note:  This document was prepared using Dragon voice recognition software and may include unintentional dictation errors.    Loney Hering, MD 10/11/16 [phone]

## 2016-10-11 NOTE — ED Provider Notes (Addendum)
Clinical Course as of Oct 11 1200  Wed Oct 11, 2016  0822 Reevaluated the patient. She reports ongoing pain, points focally at the left medial shoulder region. Reports pain with any movement about the left shoulder joint. Tenderness to palpation along the distal clavicle and swelling as along the medial aspect of the shoulder. There is no swelling, no erythema, no warmth about the joint. She is able to move it but with fair amount of pain that she localizes point primarily over the distal clavicle. Median ulnar and radial nerve function intact. Strong radial pulse. I refill. Reports she does not feel short of breath at this time. Denies any chest pain. We'll provide additional analgesic relief. Patient fully awake and alert this time.  [MQ]  [id number] Patient resting comfortably. Reports refief.   [MQ]    Clinical Course User Index [MQ] Delman Kitten, MD     ----------------------------------------- 12:00 PM on 10/11/2016 -----------------------------------------  Patient reports she feels better. Resting comfortably. Speaking in full sentences. Alert and oriented, patient requesting be discharged. Plan to follow-up with Dr. Rudene Christians orthopedics.   Delman Kitten, MD 10/11/16 1201    Delman Kitten, MD 10/11/16 1202

## 2016-10-11 NOTE — ED Triage Notes (Signed)
Pt in with co left sided neck pain since yest, now radiates into left arm, states unable to move it due to pain. Denies any hx of the same or any recent injury.

## 2017-04-23 ENCOUNTER — Inpatient Hospital Stay
Admission: EM | Admit: 2017-04-23 | Discharge: 2017-04-25 | DRG: 190 | Disposition: A | Discharge status: Home / Self Care | Payer: Medicaid Other | Attending: Internal Medicine | Admitting: Internal Medicine

## 2017-04-23 ENCOUNTER — Other Ambulatory Visit: Payer: Self-pay

## 2017-04-23 ENCOUNTER — Emergency Department: Payer: Medicaid Other

## 2017-04-23 ENCOUNTER — Encounter: Payer: Self-pay | Admitting: Medical Oncology

## 2017-04-23 DIAGNOSIS — J9621 Acute and chronic respiratory failure with hypoxia: Secondary | ICD-10-CM | POA: Diagnosis present

## 2017-04-23 DIAGNOSIS — J101 Influenza due to other identified influenza virus with other respiratory manifestations: Secondary | ICD-10-CM

## 2017-04-23 DIAGNOSIS — Z7984 Long term (current) use of oral hypoglycemic drugs: Secondary | ICD-10-CM | POA: Diagnosis not present

## 2017-04-23 DIAGNOSIS — Z79899 Other long term (current) drug therapy: Secondary | ICD-10-CM | POA: Diagnosis not present

## 2017-04-23 DIAGNOSIS — K219 Gastro-esophageal reflux disease without esophagitis: Secondary | ICD-10-CM | POA: Diagnosis present

## 2017-04-23 DIAGNOSIS — F410 Panic disorder [episodic paroxysmal anxiety] without agoraphobia: Secondary | ICD-10-CM | POA: Diagnosis present

## 2017-04-23 DIAGNOSIS — T380X5A Adverse effect of glucocorticoids and synthetic analogues, initial encounter: Secondary | ICD-10-CM | POA: Diagnosis present

## 2017-04-23 DIAGNOSIS — J440 Chronic obstructive pulmonary disease with acute lower respiratory infection: Secondary | ICD-10-CM | POA: Diagnosis present

## 2017-04-23 DIAGNOSIS — Z7951 Long term (current) use of inhaled steroids: Secondary | ICD-10-CM | POA: Diagnosis not present

## 2017-04-23 DIAGNOSIS — I10 Essential (primary) hypertension: Secondary | ICD-10-CM | POA: Diagnosis present

## 2017-04-23 DIAGNOSIS — Z886 Allergy status to analgesic agent status: Secondary | ICD-10-CM

## 2017-04-23 DIAGNOSIS — E7800 Pure hypercholesterolemia, unspecified: Secondary | ICD-10-CM | POA: Diagnosis present

## 2017-04-23 DIAGNOSIS — R0602 Shortness of breath: Secondary | ICD-10-CM | POA: Diagnosis present

## 2017-04-23 DIAGNOSIS — G4730 Sleep apnea, unspecified: Secondary | ICD-10-CM | POA: Diagnosis present

## 2017-04-23 DIAGNOSIS — Z86718 Personal history of other venous thrombosis and embolism: Secondary | ICD-10-CM

## 2017-04-23 DIAGNOSIS — Z885 Allergy status to narcotic agent status: Secondary | ICD-10-CM | POA: Diagnosis not present

## 2017-04-23 DIAGNOSIS — J209 Acute bronchitis, unspecified: Secondary | ICD-10-CM | POA: Diagnosis present

## 2017-04-23 DIAGNOSIS — Z7901 Long term (current) use of anticoagulants: Secondary | ICD-10-CM | POA: Diagnosis not present

## 2017-04-23 DIAGNOSIS — J441 Chronic obstructive pulmonary disease with (acute) exacerbation: Secondary | ICD-10-CM | POA: Diagnosis not present

## 2017-04-23 DIAGNOSIS — F411 Generalized anxiety disorder: Secondary | ICD-10-CM | POA: Diagnosis present

## 2017-04-23 DIAGNOSIS — F17210 Nicotine dependence, cigarettes, uncomplicated: Secondary | ICD-10-CM | POA: Diagnosis present

## 2017-04-23 DIAGNOSIS — E1165 Type 2 diabetes mellitus with hyperglycemia: Secondary | ICD-10-CM | POA: Diagnosis present

## 2017-04-23 DIAGNOSIS — Z96652 Presence of left artificial knee joint: Secondary | ICD-10-CM | POA: Diagnosis present

## 2017-04-23 DIAGNOSIS — R0902 Hypoxemia: Secondary | ICD-10-CM

## 2017-04-23 LAB — COMPREHENSIVE METABOLIC PANEL
ALBUMIN: 3.4 g/dL — AB (ref 3.5–5.0)
ALT: 15 U/L (ref 14–54)
ANION GAP: 13 (ref 5–15)
AST: 22 U/L (ref 15–41)
Alkaline Phosphatase: 102 U/L (ref 38–126)
BUN: 11 mg/dL (ref 6–20)
CHLORIDE: 86 mmol/L — AB (ref 101–111)
CO2: 34 mmol/L — ABNORMAL HIGH (ref 22–32)
Calcium: 8.9 mg/dL (ref 8.9–10.3)
Creatinine, Ser: 0.77 mg/dL (ref 0.44–1.00)
GFR calc Af Amer: >60 mL/min (ref >60)
Glucose, Bld: 147 mg/dL — ABNORMAL HIGH (ref 65–99)
POTASSIUM: 3.1 mmol/L — AB (ref 3.5–5.1)
Sodium: 133 mmol/L — ABNORMAL LOW (ref 135–145)
Total Bilirubin: 0.7 mg/dL (ref 0.3–1.2)
Total Protein: 7.4 g/dL (ref 6.5–8.1)

## 2017-04-23 LAB — GLUCOSE, CAPILLARY
GLUCOSE-CAPILLARY: 205 mg/dL — AB (ref 65–99)
GLUCOSE-CAPILLARY: 214 mg/dL — AB (ref 65–99)
Glucose-Capillary: 206 mg/dL — ABNORMAL HIGH (ref 65–99)

## 2017-04-23 LAB — CBC
HCT: 41 % (ref 35.0–47.0)
HEMOGLOBIN: 13.6 g/dL (ref 12.0–16.0)
MCH: 28.1 pg (ref 26.0–34.0)
MCHC: 33.2 g/dL (ref 32.0–36.0)
MCV: 84.7 fL (ref 80.0–100.0)
PLATELETS: 271 10*3/uL (ref 150–440)
RBC: 4.85 MIL/uL (ref 3.80–5.20)
RDW: 15.6 % — ABNORMAL HIGH (ref 11.5–14.5)
WBC: 8.2 10*3/uL (ref 3.6–11.0)

## 2017-04-23 LAB — LACTIC ACID, PLASMA: LACTIC ACID, VENOUS: 1.6 mmol/L (ref 0.5–1.9)

## 2017-04-23 LAB — PROCALCITONIN

## 2017-04-23 LAB — INFLUENZA PANEL BY PCR (TYPE A & B)
Influenza A By PCR: POSITIVE — AB
Influenza B By PCR: NEGATIVE

## 2017-04-23 LAB — TROPONIN I

## 2017-04-23 MED ORDER — NICOTINE 21 MG/24HR TD PT24
21.0000 mg | MEDICATED_PATCH | Freq: Every day | TRANSDERMAL | Status: DC
  Administered 2017-04-23 – 2017-04-25 (×3): 21 mg via TRANSDERMAL
  Filled 2017-04-23 (×3): qty 1

## 2017-04-23 MED ORDER — PANTOPRAZOLE SODIUM 40 MG PO TBEC
40.0000 mg | DELAYED_RELEASE_TABLET | Freq: Every day | ORAL | Status: DC
  Administered 2017-04-23 – 2017-04-25 (×3): 40 mg via ORAL
  Filled 2017-04-23 (×3): qty 1

## 2017-04-23 MED ORDER — ONDANSETRON HCL 4 MG PO TABS: 4.0000 mg | ORAL_TABLET | Freq: Four times a day (QID) | ORAL | Status: DC | PRN

## 2017-04-23 MED ORDER — POTASSIUM CHLORIDE ER 10 MEQ PO TBCR
10.0000 meq | EXTENDED_RELEASE_TABLET | ORAL | Status: DC
  Administered 2017-04-25: 10:00:00 10 meq via ORAL
  Filled 2017-04-23 (×4): qty 1

## 2017-04-23 MED ORDER — SODIUM CHLORIDE 0.9% FLUSH
3.0000 mL | Freq: Two times a day (BID) | INTRAVENOUS | Status: DC
  Administered 2017-04-23 – 2017-04-25 (×5): 3 mL via INTRAVENOUS

## 2017-04-23 MED ORDER — DOXYCYCLINE HYCLATE 100 MG PO TABS
100.0000 mg | ORAL_TABLET | Freq: Two times a day (BID) | ORAL | Status: DC
  Administered 2017-04-23: 100 mg via ORAL
  Filled 2017-04-23 (×3): qty 1

## 2017-04-23 MED ORDER — GUAIFENESIN ER 600 MG PO TB12
600.0000 mg | ORAL_TABLET | Freq: Two times a day (BID) | ORAL | Status: DC
  Administered 2017-04-23 – 2017-04-25 (×4): 600 mg via ORAL
  Filled 2017-04-23 (×4): qty 1

## 2017-04-23 MED ORDER — ROSUVASTATIN CALCIUM 10 MG PO TABS
10.0000 mg | ORAL_TABLET | Freq: Every evening | ORAL | Status: DC
  Administered 2017-04-23 – 2017-04-24 (×2): 10 mg via ORAL
  Filled 2017-04-23 (×2): qty 1

## 2017-04-23 MED ORDER — METOPROLOL TARTRATE 50 MG PO TABS
50.0000 mg | ORAL_TABLET | Freq: Every morning | ORAL | Status: DC
  Administered 2017-04-23 – 2017-04-25 (×3): 50 mg via ORAL
  Filled 2017-04-23 (×3): qty 1

## 2017-04-23 MED ORDER — LORAZEPAM 2 MG/ML IJ SOLN
0.5000 mg | Freq: Once | INTRAMUSCULAR | Status: AC
  Administered 2017-04-23: 0.5 mg via INTRAVENOUS

## 2017-04-23 MED ORDER — IPRATROPIUM-ALBUTEROL 0.5-2.5 (3) MG/3ML IN SOLN
3.0000 mL | Freq: Once | RESPIRATORY_TRACT | Status: AC
  Administered 2017-04-23: 3 mL via RESPIRATORY_TRACT
  Filled 2017-04-23: qty 3

## 2017-04-23 MED ORDER — POLYETHYLENE GLYCOL 3350 17 G PO PACK
17.0000 g | PACK | Freq: Every day | ORAL | Status: DC | PRN
  Administered 2017-04-24: 18:00:00 17 g via ORAL
  Filled 2017-04-23: qty 1

## 2017-04-23 MED ORDER — VITAMIN D 1000 UNITS PO TABS
2000.0000 [IU] | ORAL_TABLET | Freq: Every day | ORAL | Status: DC
  Administered 2017-04-23 – 2017-04-25 (×3): 2000 [IU] via ORAL
  Filled 2017-04-23 (×4): qty 2

## 2017-04-23 MED ORDER — ONDANSETRON HCL 4 MG/2ML IJ SOLN: 4.0000 mg | Freq: Four times a day (QID) | INTRAMUSCULAR | Status: DC | PRN

## 2017-04-23 MED ORDER — ACETAMINOPHEN 325 MG PO TABS
650.0000 mg | ORAL_TABLET | Freq: Four times a day (QID) | ORAL | Status: DC | PRN
  Administered 2017-04-23 – 2017-04-24 (×2): 650 mg via ORAL
  Filled 2017-04-23 (×2): qty 2

## 2017-04-23 MED ORDER — OSELTAMIVIR PHOSPHATE 75 MG PO CAPS
75.0000 mg | ORAL_CAPSULE | Freq: Two times a day (BID) | ORAL | Status: DC
  Administered 2017-04-23 – 2017-04-25 (×4): 75 mg via ORAL
  Filled 2017-04-23 (×5): qty 1

## 2017-04-23 MED ORDER — TIOTROPIUM BROMIDE MONOHYDRATE 18 MCG IN CAPS: 18.0000 ug | ORAL_CAPSULE | Freq: Every day | RESPIRATORY_TRACT | Status: DC

## 2017-04-23 MED ORDER — SODIUM CHLORIDE 0.9% FLUSH: 3.0000 mL | INTRAVENOUS | Status: DC | PRN

## 2017-04-23 MED ORDER — METHYLPREDNISOLONE SODIUM SUCC 125 MG IJ SOLR
60.0000 mg | Freq: Four times a day (QID) | INTRAMUSCULAR | Status: DC
  Administered 2017-04-23 – 2017-04-24 (×4): 60 mg via INTRAVENOUS
  Filled 2017-04-23 (×4): qty 2

## 2017-04-23 MED ORDER — METFORMIN HCL 500 MG PO TABS
500.0000 mg | ORAL_TABLET | Freq: Two times a day (BID) | ORAL | Status: DC
  Administered 2017-04-23 – 2017-04-25 (×4): 500 mg via ORAL
  Filled 2017-04-23 (×4): qty 1

## 2017-04-23 MED ORDER — BUDESONIDE 0.25 MG/2ML IN SUSP
0.2500 mg | Freq: Two times a day (BID) | RESPIRATORY_TRACT | Status: DC
  Administered 2017-04-23 – 2017-04-25 (×4): 0.25 mg via RESPIRATORY_TRACT
  Filled 2017-04-23 (×4): qty 2

## 2017-04-23 MED ORDER — POTASSIUM CHLORIDE CRYS ER 20 MEQ PO TBCR
40.0000 meq | EXTENDED_RELEASE_TABLET | Freq: Once | ORAL | Status: AC
  Administered 2017-04-23: 40 meq via ORAL
  Filled 2017-04-23: qty 2

## 2017-04-23 MED ORDER — SODIUM CHLORIDE 0.9 % IV SOLN: 250.0000 mL | INTRAVENOUS | Status: DC | PRN

## 2017-04-23 MED ORDER — RIVAROXABAN 20 MG PO TABS
20.0000 mg | ORAL_TABLET | Freq: Every day | ORAL | Status: DC
  Administered 2017-04-23: 17:00:00 20 mg via ORAL
  Filled 2017-04-23 (×2): qty 1

## 2017-04-23 MED ORDER — METHOCARBAMOL 500 MG PO TABS
500.0000 mg | ORAL_TABLET | Freq: Four times a day (QID) | ORAL | Status: DC
  Administered 2017-04-23: 500 mg via ORAL
  Filled 2017-04-23 (×5): qty 1

## 2017-04-23 MED ORDER — OXYCODONE HCL 5 MG PO TABS
15.0000 mg | ORAL_TABLET | Freq: Four times a day (QID) | ORAL | Status: DC | PRN
  Administered 2017-04-23: 15 mg via ORAL
  Filled 2017-04-23: qty 3

## 2017-04-23 MED ORDER — ACETAMINOPHEN 650 MG RE SUPP: 650.0000 mg | Freq: Four times a day (QID) | RECTAL | Status: DC | PRN

## 2017-04-23 MED ORDER — GUAIFENESIN-DM 100-10 MG/5ML PO SYRP
5.0000 mL | ORAL_SOLUTION | ORAL | Status: DC | PRN
  Filled 2017-04-23: qty 5

## 2017-04-23 MED ORDER — AMLODIPINE BESYLATE 5 MG PO TABS
5.0000 mg | ORAL_TABLET | Freq: Every morning | ORAL | Status: DC
  Administered 2017-04-23 – 2017-04-25 (×3): 5 mg via ORAL
  Filled 2017-04-23 (×3): qty 1

## 2017-04-23 MED ORDER — HYDROCHLOROTHIAZIDE 25 MG PO TABS
25.0000 mg | ORAL_TABLET | Freq: Every day | ORAL | Status: DC
  Administered 2017-04-23 – 2017-04-25 (×3): 25 mg via ORAL
  Filled 2017-04-23 (×3): qty 1

## 2017-04-23 MED ORDER — TIOTROPIUM BROMIDE MONOHYDRATE 18 MCG IN CAPS
18.0000 ug | ORAL_CAPSULE | Freq: Every day | RESPIRATORY_TRACT | Status: DC
  Administered 2017-04-24 – 2017-04-25 (×2): 18 ug via RESPIRATORY_TRACT
  Filled 2017-04-23: qty 5

## 2017-04-23 MED ORDER — PNEUMOCOCCAL VAC POLYVALENT 25 MCG/0.5ML IJ INJ
0.5000 mL | INJECTION | INTRAMUSCULAR | Status: DC
  Filled 2017-04-23 (×2): qty 0.5

## 2017-04-23 MED ORDER — LABETALOL HCL 5 MG/ML IV SOLN
10.0000 mg | Freq: Once | INTRAVENOUS | Status: AC
  Administered 2017-04-23: 10 mg via INTRAVENOUS
  Filled 2017-04-23: qty 4

## 2017-04-23 MED ORDER — OSELTAMIVIR PHOSPHATE 75 MG PO CAPS
75.0000 mg | ORAL_CAPSULE | Freq: Once | ORAL | Status: AC
  Administered 2017-04-23: 75 mg via ORAL
  Filled 2017-04-23: qty 1

## 2017-04-23 MED ORDER — LORAZEPAM 2 MG/ML IJ SOLN
INTRAMUSCULAR | Status: AC
  Filled 2017-04-23: qty 1

## 2017-04-23 MED ORDER — INSULIN ASPART 100 UNIT/ML ~~LOC~~ SOLN
0.0000 [IU] | Freq: Three times a day (TID) | SUBCUTANEOUS | Status: DC
  Administered 2017-04-23 (×2): 3 [IU] via SUBCUTANEOUS
  Administered 2017-04-24: 13:00:00 5 [IU] via SUBCUTANEOUS
  Administered 2017-04-24 (×2): 3 [IU] via SUBCUTANEOUS
  Administered 2017-04-25: 09:00:00 1 [IU] via SUBCUTANEOUS
  Filled 2017-04-23 (×6): qty 1

## 2017-04-23 NOTE — H&P (Signed)
[facility] [place] NAME: Patty Leon    MR#:  [id number]  DATE OF BIRTH:  07-Aug-1960  DATE OF ADMISSION:  04/23/2017  PRIMARY CARE PHYSICIAN: Danelle Berry, NP   REQUESTING/REFERRING PHYSICIAN: Lavonia Drafts MD  CHIEF COMPLAINT:   Chief Complaint  Patient presents with  . Cough  . Fever  . Shortness of Breath    HISTORY OF PRESENT ILLNESS: Patty Leon  is a 57 y.o. female with a known history of COPD, diabetes type 2, GERD, essential hypertension, history of DVT, hyperlipidemia and sleep apnea presenting to the hospital with complaint of progressive shortness of breath ongoing for the past few days.  She complains of sputum that is clear in color.  She has also been having progressive wheezing.  She reports that she has oxygen at nighttime with her CPAP machine but she has been using it intermittently during the day as well.  She has not had any nausea vomiting or diarrhea.  In the ER she is noted to have a flu.  PAST MEDICAL HISTORY:   Past Medical History:  Diagnosis Date  . Anxiety   . Arthritis   . COPD (chronic obstructive pulmonary disease) ([place])   . Diabetes mellitus without complication ([place])   . Diverticulosis   . Fluid retention   . GERD (gastroesophageal reflux disease)   . History of blood clots   . Hypercholesteremia   . Hypertension   . Panic attacks   . Shortness of breath dyspnea   . Sleep apnea    Use C-PAP with oxygen    PAST SURGICAL HISTORY:  Past Surgical History:  Procedure Laterality Date  . BILATERAL CARPAL TUNNEL RELEASE    . COLONOSCOPY    . COLONOSCOPY N/A 01/22/2015   Procedure: COLONOSCOPY;  Surgeon: Josefine Class, MD;  Location: [facility] ENDOSCOPY;  Service: Endoscopy;  Laterality: N/A;  . COLONOSCOPY WITH PROPOFOL N/A 07/12/2016   Procedure: COLONOSCOPY WITH PROPOFOL;  Surgeon: Manya Silvas, MD;  Location[facility] ENDOSCOPY;  Service: Endoscopy;  Laterality: N/A;  . JOINT REPLACEMENT Left  09/2015  . KNEE ARTHROSCOPY Left   . KNEE CLOSED REDUCTION Left 11/04/2015   Procedure: CLOSED MANIPULATION KNEE;  Surgeon: Hessie Knows, MD;  Location: [facility] ORS;  Service: Orthopedics;  Laterality: Left;  . KNEE CLOSED REDUCTION Left 04/04/2016   Procedure: CLOSED MANIPULATION KNEE;  Surgeon: Hessie Knows, MD;  Location: [facility] ORS;  Service: Orthopedics;  Laterality: Left;  . SHOULDER ARTHROSCOPY W/ ROTATOR CUFF REPAIR Right   . TOTAL KNEE ARTHROPLASTY Left 10/07/2015   Procedure: TOTAL KNEE ARTHROPLASTY;  Surgeon: Hessie Knows, MD;  Location: [facility] ORS;  Service: Orthopedics;  Laterality: Left;  . TOTAL KNEE REVISION Left 02/08/2016   Procedure: TOTAL KNEE REVISION;  Surgeon: Hessie Knows, MD;  Location: [facility] ORS;  Service: Orthopedics;  Laterality: Left;  . TUBAL LIGATION      SOCIAL HISTORY:  Social History   Tobacco Use  . Smoking status: Current Every Day Smoker    Packs/day: 0.50    Years: 30.00    Pack years: 15.00    Types: Cigarettes  . Smokeless tobacco: Never Used  Substance Use Topics  . Alcohol use: No    FAMILY HISTORY:  Family History  Problem Relation Age of Onset  . Lung cancer Mother   . Ovarian cancer Paternal Grandmother   . Breast cancer Neg Hx     DRUG ALLERGIES:  Allergies  Allergen Reactions  . Celebrex [  Celecoxib] Swelling  . Dilaudid [Hydromorphone] Itching    REVIEW OF SYSTEMS:   CONSTITUTIONAL: No fever, fatigue or weakness.  EYES: No blurred or double vision.  EARS, NOSE, AND THROAT: No tinnitus or ear pain.  RESPIRATORY: Positive cough, positive shortness of breath, positive wheezing or hemoptysis.  CARDIOVASCULAR: No chest pain, orthopnea, edema.  GASTROINTESTINAL: No nausea, vomiting, diarrhea or abdominal pain.  GENITOURINARY: No dysuria, hematuria.  ENDOCRINE: No polyuria, nocturia,  HEMATOLOGY: No anemia, easy bruising or bleeding SKIN: No rash or lesion. MUSCULOSKELETAL: No joint pain or arthritis.   NEUROLOGIC: No tingling,  numbness, weakness.  PSYCHIATRY: No anxiety or depression.   MEDICATIONS AT HOME:  Prior to Admission medications   Medication Sig Start Date End Date Taking? Authorizing Provider  albuterol (PROAIR HFA) 108 (90 Base) MCG/ACT inhaler Inhale 2 puffs into the lungs every 6 (six) hours as needed for wheezing.   Yes [provider]  amLODipine (NORVASC) 5 MG tablet Take 5 mg by mouth every morning.    Yes [provider]  BYDUREON BCISE 2 MG/0.85ML AUIJ Inject 2 mg into the skin every Sunday. 04/09/17  Yes [provider]  Cholecalciferol (VITAMIN D) 2000 units CAPS Take 2,000 Units by mouth daily.   Yes [provider]  docusate sodium (COLACE) 100 MG capsule Take 100 mg by mouth daily. 08/12/15  Yes [provider]  Fluticasone-Salmeterol (ADVAIR DISKUS) 250-50 MCG/DOSE AEPB Inhale 1 puff into the lungs 2 (two) times daily.   Yes [provider]  GRALISE 600 MG TABS Take 1,800 mg by mouth daily as needed (for pain.).  11/25/15  Yes [provider]  hydrochlorothiazide (HYDRODIURIL) 25 MG tablet Take 25 mg by mouth daily. 09/13/15  Yes [provider]  KLOR-CON 10 10 MEQ tablet Take 10 mEq by mouth every Monday, Wednesday, and Friday. 02/24/17  Yes [provider]  metFORMIN (GLUCOPHAGE) 500 MG tablet Take 500 mg by mouth 2 (two) times daily. 07/26/15  Yes [provider]  metoprolol (LOPRESSOR) 50 MG tablet Take 50 mg by mouth every morning.  08/29/15  Yes [provider]  omeprazole (PRILOSEC) 40 MG capsule Take 40 mg by mouth every morning.  07/27/15  Yes [provider]  Oxycodone HCl 20 MG TABS Take 15 mg by mouth every 6 (six) hours as needed.  02/29/16  Yes [provider]  polyethylene glycol powder (GLYCOLAX/MIRALAX) powder Take 17 g by mouth daily as needed for mild constipation.  10/27/15  Yes [provider]  roflumilast (DALIRESP) 500 MCG TABS tablet Take 500 mcg by mouth  every morning.    Yes [provider]  rosuvastatin (CRESTOR) 10 MG tablet Take 10 mg by mouth every evening. 11/05/13  Yes [provider]  SPIRIVA HANDIHALER 18 MCG inhalation capsule Place 18 mcg into inhaler and inhale daily. 03/27/17  Yes [provider]  XARELTO 20 MG TABS tablet Take 20 mg by mouth daily. 03/01/16  Yes [provider]  lidocaine (LIDODERM) 5 % Place 1 patch onto the skin every 12 (twelve) hours. Remove & Discard patch within 12 hours or as directed by MD Patient not taking: Reported on 04/23/2017 10/11/16 10/11/17  Loney Hering, MD  methocarbamol (ROBAXIN) 500 MG tablet Take 1 tablet (500 mg total) by mouth 4 (four) times daily. Patient not taking: Reported on 04/23/2017 07/12/16   Cuthriell, Charline Bills, PA-C  predniSONE (DELTASONE) 10 MG tablet Take 1 tablet (10 mg total) by mouth daily. Patient not taking:  Reported on 04/23/2017 07/12/16   Cuthriell, Charline Bills, PA-C      PHYSICAL EXAMINATION:   VITAL SIGNS: Blood pressure 108/86, pulse 99, temperature 98.8 F (37.1 C), temperature source Oral, resp. rate 20, height 5\' 2"  (1.575 m), weight 275 lb (124.7 kg), last menstrual period 04/24/2014, SpO2 100 %.  GENERAL:  57 y.o.-year-old patient lying in the bed with no acute distress.  EYES: Pupils equal, round, reactive to light and accommodation. No scleral icterus. Extraocular muscles intact.  HEENT: Head atraumatic, normocephalic. Oropharynx and nasopharynx clear.  NECK:  Supple, no jugular venous distention. No thyroid enlargement, no tenderness.  LUNGS: Bilateral wheezing throughout both lungs no crackles CARDIOVASCULAR: S1, S2 normal. No murmurs, rubs, or gallops.  ABDOMEN: Soft, nontender, nondistended. Bowel sounds present. No organomegaly or mass.  EXTREMITIES: No pedal edema, cyanosis, or clubbing.  NEUROLOGIC: Cranial nerves II through XII are intact. Muscle strength 5/5 in all extremities. Sensation intact. Gait not checked.   PSYCHIATRIC: The patient is alert and oriented x 3.  SKIN: No obvious rash, lesion, or ulcer.   LABORATORY PANEL:   CBC Recent Labs  Lab 04/23/17 0952  WBC 8.2  HGB 13.6  HCT 41.0  PLT 271  MCV 84.7  MCH 28.1  MCHC 33.2  RDW 15.6*   ------------------------------------------------------------------------------------------------------------------  Chemistries  Recent Labs  Lab 04/23/17 0952  NA 133*  K 3.1*  CL 86*  CO2 34*  GLUCOSE 147*  BUN 11  CREATININE 0.77  CALCIUM 8.9  AST 22  ALT 15  ALKPHOS 102  BILITOT 0.7   ------------------------------------------------------------------------------------------------------------------ estimated creatinine clearance is 100.2 mL/min (by C-G formula based on SCr of 0.77 mg/dL). ------------------------------------------------------------------------------------------------------------------ No results for input(s): TSH, T4TOTAL, T3FREE, THYROIDAB in the last 72 hours.  Invalid input(s): FREET3   Coagulation profile No results for input(s): INR, PROTIME in the last 168 hours. ------------------------------------------------------------------------------------------------------------------- No results for input(s): DDIMER in the last 72 hours. -------------------------------------------------------------------------------------------------------------------  Cardiac Enzymes Recent Labs  Lab 04/23/17 0952  TROPONINI <0.03   ------------------------------------------------------------------------------------------------------------------ Invalid input(s): POCBNP  ---------------------------------------------------------------------------------------------------------------  Urinalysis    Component Value Date/Time   COLORURINE STRAW (A) 01/25/2016 0938   APPEARANCEUR CLEAR (A) 01/25/2016 0938   APPEARANCEUR Clear 09/03/2012 0739   LABSPEC 1.006 01/25/2016 0938   LABSPEC 1.005 09/03/2012 0739   PHURINE 6.0  01/25/2016 0938   GLUCOSEU NEGATIVE 01/25/2016 0938   GLUCOSEU Negative 09/03/2012 0739   HGBUR 1+ (A) 01/25/2016 0938   BILIRUBINUR NEGATIVE 01/25/2016 0938   BILIRUBINUR Negative 09/03/2012 0739   KETONESUR NEGATIVE 01/25/2016 0938   PROTEINUR NEGATIVE 01/25/2016 0938   NITRITE NEGATIVE 01/25/2016 0938   LEUKOCYTESUR NEGATIVE 01/25/2016 0938   LEUKOCYTESUR Negative 09/03/2012 0739     RADIOLOGY: Dg Chest 2 View  Result Date: 04/23/2017 CLINICAL DATA:  Cough worsening shortness of breath over the past few days. Oxygen dependent. EXAM: CHEST  2 VIEW COMPARISON:  10/11/2016 FINDINGS: Lungs are adequately inflated without focal airspace consolidation or effusion. There is subtle prominence of the central bronchovascular markings which may be due to asthma or viral bronchopneumonia. Cardiomediastinal silhouette and remainder of the exam is unchanged. IMPRESSION: No focal airspace process. Subtle prominence of the central bronchovascular markings which may be seen with asthma versus viral bronchopneumonia. Electronically Signed   By: Marin Olp M.D.   On: 04/23/2017 10:05    EKG: Orders placed or performed during the hospital encounter of 04/23/17  . ED EKG within 10 minutes  . ED EKG within 10 minutes  . EKG 12-Lead  .  EKG 12-Lead    IMPRESSION AND PLAN: Patient is a 57 year old African-American female presenting with worsening shortness of breath  1.  Acute on chronic COPD exasperation We will treat patient with nebulizers Steroids And oral antibiotics for acute bronchitis Patient may also have a viral pneumonia  2.  Influenza A treat with Tamiflu  3.  Diabetes type 2 placed on sliding scale continue metformin  4.  Essential hypertension continue hydrochlorothiazide and metoprolol  5.  Generalized anxiety continue lorazepam  6. h/o  DVT continue Xarelto  7. hyperLipidemia continue Crestor  All the records are reviewed and case discussed with ED provider. Management  plans discussed with the patient, family and they are in agreement.  CODE STATUS: Code Status History    Date Active Date Inactive Code Status Order ID Comments User Context   02/08/2016 13:37 02/11/2016 14:02 Full Code [id number]  Hessie Knows, MD Inpatient   10/07/2015 18:31 10/10/2015 15:58 Full Code [id number]  Hessie Knows, MD Inpatient       TOTAL TIME TAKING CARE OF THIS PATIENT: 55 minutes.    Dustin Flock M.D on 04/23/2017 at 3:32 PM  Between 7am to 6pm - Pager - [phone]  After 6pm go to [email or web address] - password [email or web address] [facility]  [facility]  Office  [phone]  CC: Primary care physician; Danelle Berry, NP

## 2017-04-23 NOTE — ED Triage Notes (Signed)
Pt reports she has been coughing and having worsening sob over past few days. Pt has fever in triage. Placed on 3L [place]. Pt has prn home O2 also.

## 2017-04-23 NOTE — ED Provider Notes (Signed)
[facility] Emergency Department Provider Note   ____________________________________________    I have reviewed the triage vital signs and the nursing notes.   HISTORY  Chief Complaint Cough; Fever; and Shortness of Breath     HPI Patty Leon is a 57 y.o. female present with complaints of cough fever and shortness of breath.  Patient reports she started feeling ill 48 hours ago and developed a severe cough in the last day which is productive.  She does report a history of COPD and diabetes.  She uses a CPAP at night sometimes with oxygen but typically does not have to use oxygen during the day.  Subjective fever at home.  Worsening shortness of breath especially with ambulation.  Some chest tightness, no diaphoresis nausea or vomiting.  No myalgias reported   Past Medical History:  Diagnosis Date  . Anxiety   . Arthritis   . COPD (chronic obstructive pulmonary disease) ([place])   . Diabetes mellitus without complication ([place])   . Diverticulosis   . Fluid retention   . GERD (gastroesophageal reflux disease)   . History of blood clots   . Hypercholesteremia   . Hypertension   . Panic attacks   . Shortness of breath dyspnea   . Sleep apnea    Use C-PAP with oxygen    Patient Active Problem List   Diagnosis Date Noted  . 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 Primary localized osteoarthritis of left knee 10/07/2015    Past Surgical History:  Procedure Laterality Date  . BILATERAL CARPAL TUNNEL RELEASE    . COLONOSCOPY    . COLONOSCOPY N/A 01/22/2015   Procedure: COLONOSCOPY;  Surgeon: Josefine Class, MD;  Location: [facility] ENDOSCOPY;  Service: Endoscopy;  Laterality: N/A;  . COLONOSCOPY WITH PROPOFOL N/A 07/12/2016   Procedure: COLONOSCOPY WITH PROPOFOL;  Surgeon: Manya Silvas, MD;  Location: [facility] ENDOSCOPY;  Service: Endoscopy;  Laterality: N/A;  . JOINT REPLACEMENT Left 09/2015  . KNEE ARTHROSCOPY Left   . KNEE CLOSED  REDUCTION Left 11/04/2015   Procedure: CLOSED MANIPULATION KNEE;  Surgeon: Hessie Knows, MD;  Location: [facility] ORS;  Service: Orthopedics;  Laterality: Left;  . KNEE CLOSED REDUCTION Left 04/04/2016   Procedure: CLOSED MANIPULATION KNEE;  Surgeon: Hessie Knows, MD;  Location: [facility] ORS;  Service: Orthopedics;  Laterality: Left;  . SHOULDER ARTHROSCOPY W/ ROTATOR CUFF REPAIR Right   . TOTAL KNEE ARTHROPLASTY Left 10/07/2015   Procedure: TOTAL KNEE ARTHROPLASTY;  Surgeon: Hessie Knows, MD;  Location: [facility] ORS;  Service: Orthopedics;  Laterality: Left;  . TOTAL KNEE REVISION Left 02/08/2016   Procedure: TOTAL KNEE REVISION;  Surgeon: Hessie Knows, MD;  Location: [facility] ORS;  Service: Orthopedics;  Laterality: Left;  . TUBAL LIGATION      Prior to Admission medications   Medication Sig Start Date End Date Taking? Authorizing Provider  albuterol (PROAIR HFA) 108 (90 Base) MCG/ACT inhaler Inhale 2 puffs into the lungs every 6 (six) hours as needed for wheezing.   Yes [provider]  amLODipine (NORVASC) 5 MG tablet Take 5 mg by mouth every morning.    Yes [provider]  BYDUREON BCISE 2 MG/0.85ML AUIJ Inject 2 mg into the skin every Sunday. 04/09/17  Yes [provider]  Cholecalciferol (VITAMIN D) 2000 units CAPS Take 2,000 Units by mouth daily.   Yes [provider]  docusate sodium (COLACE) 100 MG capsule Take 100 mg by mouth daily. 08/12/15  Yes [provider]  Fluticasone-Salmeterol (ADVAIR DISKUS) 250-50 MCG/DOSE AEPB Inhale 1 puff into the lungs 2 (two) times daily.   Yes [provider]  GRALISE 600 MG TABS Take 1,800 mg by mouth daily as needed (for pain.).  11/25/15  Yes [provider]  hydrochlorothiazide (HYDRODIURIL) 25 MG tablet Take 25 mg by mouth daily. 09/13/15  Yes [provider]  KLOR-CON 10 10 MEQ tablet Take 10 mEq by mouth every Monday, Wednesday, and Friday. 02/24/17  Yes [provider]  metFORMIN  (GLUCOPHAGE) 500 MG tablet Take 500 mg by mouth 2 (two) times daily. 07/26/15  Yes [provider]  metoprolol (LOPRESSOR) 50 MG tablet Take 50 mg by mouth every morning.  08/29/15  Yes [provider]  omeprazole (PRILOSEC) 40 MG capsule Take 40 mg by mouth every morning.  07/27/15  Yes [provider]  Oxycodone HCl 20 MG TABS Take 15 mg by mouth every 6 (six) hours as needed.  02/29/16  Yes [provider]  polyethylene glycol powder (GLYCOLAX/MIRALAX) powder Take 17 g by mouth daily as needed for mild constipation.  10/27/15  Yes [provider]  roflumilast (DALIRESP) 500 MCG TABS tablet Take 500 mcg by mouth every morning.    Yes [provider]  rosuvastatin (CRESTOR) 10 MG tablet Take 10 mg by mouth every evening. 11/05/13  Yes [provider]  SPIRIVA HANDIHALER 18 MCG inhalation capsule Place 18 mcg into inhaler and inhale daily. 03/27/17  Yes [provider]  XARELTO 20 MG TABS tablet Take 20 mg by mouth daily. 03/01/16  Yes [provider]  lidocaine (LIDODERM) 5 % Place 1 patch onto the skin every 12 (twelve) hours. Remove & Discard patch within 12 hours or as directed by MD Patient not taking: Reported on 04/23/2017 10/11/16 10/11/17  Loney Hering, MD  methocarbamol (ROBAXIN) 500 MG tablet Take 1 tablet (500 mg total) by mouth 4 (four) times daily. Patient not taking: Reported on 04/23/2017 07/12/16   Cuthriell, Charline Bills, PA-C  predniSONE (DELTASONE) 10 MG tablet Take 1 tablet (10 mg total) by mouth daily. Patient not taking: Reported on 04/23/2017 07/12/16   Cuthriell, Charline Bills, PA-C     Allergies Celebrex [celecoxib] and Dilaudid [hydromorphone]  Family History  Problem Relation Age of Onset  . Lung cancer Mother   . Ovarian cancer Paternal Grandmother   . Breast cancer Neg Hx     Social History Social History   Tobacco Use  . Smoking status: Current Every Day Smoker    Packs/day: 0.50    Years:  30.00    Pack years: 15.00    Types: Cigarettes  . Smokeless tobacco: Never Used  Substance Use Topics  . Alcohol use: No  . Drug use: Yes    Types: Oxycodone    Comment: as prescribed by MD    Review of Systems  Constitutional: Positive fevers and chills Eyes: No visual changes.  ENT: No sore throat. Cardiovascular: Tightness Respiratory:shortness of breath, positive cough Gastrointestinal: No abdominal pain. Genitourinary: Negative for dysuria. Musculoskeletal: No new leg swelling Skin: Negative for rash. Neurological: Negative for headaches    ____________________________________________   PHYSICAL EXAM:  VITAL SIGNS: ED Triage Vitals  Enc Vitals Group     BP 04/23/17 0934 (!) 147/126     Pulse Rate 04/23/17 0932 (!) 110     Resp 04/23/17 0932 (!) 24     Temp 04/23/17 0932 (!) 102 F (38.9 C)     Temp Source 04/23/17 0932  Oral     SpO2 04/23/17 0932 (!) 85 %     Weight 04/23/17 0932 124.7 kg (275 lb)     Height 04/23/17 0932 1.575 m (5\' 2" )     Head Circumference --      Peak Flow --      Pain Score 04/23/17 0932 10     Pain Loc --      Pain Edu? --      Excl. in [place]? --     Constitutional: Alert and oriented.  Eyes: Conjunctivae are normal.   Nose: No congestion/rhinnorhea. Mouth/Throat: Mucous membranes are moist.    Cardiovascular: Tachycardia, regular rhythm. Grossly normal heart sounds.  Good peripheral circulation. Respiratory: Increased respiratory effort with tachypnea, scattered mild wheezes, poor airflow Gastrointestinal: Soft and nontender. No distention.  No CVA tenderness. Genitourinary: deferred Musculoskeletal: Chronic left lower extremity mild edema.  Warm and well perfused Neurologic:  Normal speech and language. No gross focal neurologic deficits are appreciated.  Skin:  Skin is warm, dry and intact. No rash noted. Psychiatric: Mood and affect are normal. Speech and behavior are  normal.  ____________________________________________   LABS (all labs ordered are listed, but only abnormal results are displayed)  Labs Reviewed  CBC - Abnormal; Notable for the following components:      Result Value   RDW 15.6 (*)    All other components within normal limits  COMPREHENSIVE METABOLIC PANEL - Abnormal; Notable for the following components:   Sodium 133 (*)    Potassium 3.1 (*)    Chloride 86 (*)    CO2 34 (*)    Glucose, Bld 147 (*)    Albumin 3.4 (*)    All other components within normal limits  INFLUENZA PANEL BY PCR (TYPE A & B) - Abnormal; Notable for the following components:   Influenza A By PCR POSITIVE (*)    All other components within normal limits  CULTURE, BLOOD (ROUTINE X 2)  CULTURE, BLOOD (ROUTINE X 2)  URINE CULTURE  TROPONIN I  LACTIC ACID, PLASMA  LACTIC ACID, PLASMA  URINALYSIS, ROUTINE W REFLEX MICROSCOPIC   ____________________________________________  EKG  ED ECG REPORT I, Lavonia Drafts, the attending physician, personally viewed and interpreted this ECG.  Date: 04/23/2017  Rate: 125 Rhythm: Sinus tachycardia QRS Axis: normal Intervals: normal ST/T Wave abnormalities: normal Narrative Interpretation: no evidence of acute ischemia  ____________________________________________  RADIOLOGY  X-ray ____________________________________________   PROCEDURES  Procedure(s) performed: No  Procedures   Critical Care performed: No ____________________________________________   INITIAL IMPRESSION / ASSESSMENT AND PLAN / ED COURSE  Pertinent labs & imaging results that were available during my care of the patient were reviewed by me and considered in my medical decision making (see chart for details).  Patient presents with shortness of breath, productive cough.  Exam is notable for some wheezing and poor airflow with fever and hypoxia.  Concerning for upper respiratory infection, possible pneumonia versus viral with  worsening of COPD.  Code sepsis protocol initiated.  We will treat with DuoNeb, obtain x-ray, labs closely monitor  Influenza positive, chest x-ray overall reassuring.  Will start Tamiflu admit to the hospital service given that she is requiring oxygen to keep her oxygen saturations above 90%    ____________________________________________   FINAL CLINICAL IMPRESSION(S) / ED DIAGNOSES  Final diagnoses:  Influenza A  Shortness of breath  Hypoxia        Note:  This document was prepared using Dragon voice recognition software and may include  unintentional dictation errors.    Lavonia Drafts, MD 04/23/17 1158

## 2017-04-24 LAB — CBC
HCT: 42.6 % (ref 35.0–47.0)
HEMOGLOBIN: 13.9 g/dL (ref 12.0–16.0)
MCH: 27.8 pg (ref 26.0–34.0)
MCHC: 32.5 g/dL (ref 32.0–36.0)
MCV: 85.6 fL (ref 80.0–100.0)
PLATELETS: 250 10*3/uL (ref 150–440)
RBC: 4.98 MIL/uL (ref 3.80–5.20)
RDW: 14.7 % — ABNORMAL HIGH (ref 11.5–14.5)
WBC: 6.6 10*3/uL (ref 3.6–11.0)

## 2017-04-24 LAB — BASIC METABOLIC PANEL
ANION GAP: 9 (ref 5–15)
BUN: 12 mg/dL (ref 6–20)
CHLORIDE: 88 mmol/L — AB (ref 101–111)
CO2: 37 mmol/L — ABNORMAL HIGH (ref 22–32)
Calcium: 9.6 mg/dL (ref 8.9–10.3)
Creatinine, Ser: 0.76 mg/dL (ref 0.44–1.00)
Glucose, Bld: 188 mg/dL — ABNORMAL HIGH (ref 65–99)
Potassium: 4.2 mmol/L (ref 3.5–5.1)
SODIUM: 134 mmol/L — AB (ref 135–145)

## 2017-04-24 LAB — GLUCOSE, CAPILLARY
GLUCOSE-CAPILLARY: 236 mg/dL — AB (ref 65–99)
GLUCOSE-CAPILLARY: 189 mg/dL — AB (ref 65–99)
GLUCOSE-CAPILLARY: 209 mg/dL — AB (ref 65–99)
Glucose-Capillary: 236 mg/dL — ABNORMAL HIGH (ref 65–99)
Glucose-Capillary: 278 mg/dL — ABNORMAL HIGH (ref 65–99)

## 2017-04-24 MED ORDER — DOCUSATE SODIUM 100 MG PO CAPS
100.0000 mg | ORAL_CAPSULE | Freq: Every day | ORAL | Status: DC
  Administered 2017-04-24 – 2017-04-25 (×2): 100 mg via ORAL
  Filled 2017-04-24 (×2): qty 1

## 2017-04-24 MED ORDER — GABAPENTIN (ONCE-DAILY) 600 MG PO TABS: 1800.0000 mg | ORAL_TABLET | Freq: Every day | ORAL | Status: DC | PRN

## 2017-04-24 MED ORDER — MOMETASONE FURO-FORMOTEROL FUM 200-5 MCG/ACT IN AERO
2.0000 | INHALATION_SPRAY | Freq: Two times a day (BID) | RESPIRATORY_TRACT | Status: DC
  Administered 2017-04-24: 2 via RESPIRATORY_TRACT
  Filled 2017-04-24: qty 8.8

## 2017-04-24 MED ORDER — ALBUTEROL SULFATE (2.5 MG/3ML) 0.083% IN NEBU
2.5000 mg | INHALATION_SOLUTION | RESPIRATORY_TRACT | Status: DC | PRN
Start: 2017-04-24 — End: 2017-04-25
  Administered 2017-04-24: 16:00:00 2.5 mg via RESPIRATORY_TRACT

## 2017-04-24 MED ORDER — ROFLUMILAST 500 MCG PO TABS
500.0000 ug | ORAL_TABLET | ORAL | Status: DC
  Administered 2017-04-24 – 2017-04-25 (×2): 500 ug via ORAL
  Filled 2017-04-24 (×3): qty 1

## 2017-04-24 MED ORDER — METHYLPREDNISOLONE SODIUM SUCC 40 MG IJ SOLR
40.0000 mg | Freq: Two times a day (BID) | INTRAMUSCULAR | Status: DC
  Administered 2017-04-24 – 2017-04-25 (×2): 40 mg via INTRAVENOUS
  Filled 2017-04-24 (×2): qty 1

## 2017-04-24 MED ORDER — BUTALBITAL-APAP-CAFFEINE 50-325-40 MG PO TABS
1.0000 | ORAL_TABLET | Freq: Four times a day (QID) | ORAL | Status: DC | PRN
  Administered 2017-04-24: 1 via ORAL
  Filled 2017-04-24 (×2): qty 1

## 2017-04-24 MED ORDER — DOXYCYCLINE HYCLATE 100 MG PO TABS
100.0000 mg | ORAL_TABLET | Freq: Two times a day (BID) | ORAL | Status: DC
  Administered 2017-04-24 – 2017-04-25 (×3): 100 mg via ORAL
  Filled 2017-04-24 (×3): qty 1

## 2017-04-24 MED ORDER — RIVAROXABAN 20 MG PO TABS
20.0000 mg | ORAL_TABLET | Freq: Every day | ORAL | Status: DC
  Administered 2017-04-24: 20 mg via ORAL
  Filled 2017-04-24: qty 1

## 2017-04-24 MED ORDER — ZOLPIDEM TARTRATE 5 MG PO TABS
5.0000 mg | ORAL_TABLET | Freq: Every evening | ORAL | Status: DC | PRN
  Administered 2017-04-24: 5 mg via ORAL
  Filled 2017-04-24: qty 1

## 2017-04-24 MED ORDER — INSULIN ASPART 100 UNIT/ML ~~LOC~~ SOLN
3.0000 [IU] | Freq: Three times a day (TID) | SUBCUTANEOUS | Status: DC
  Administered 2017-04-24 – 2017-04-25 (×3): 3 [IU] via SUBCUTANEOUS
  Filled 2017-04-24 (×3): qty 1

## 2017-04-24 NOTE — Progress Notes (Signed)
Inpatient Diabetes Program Recommendations  AACE/ADA: New Consensus Statement on Inpatient Glycemic Control (2015)  Target Ranges:  Prepandial:   less than 140 mg/dL      Peak postprandial:   less than 180 mg/dL (1-2 hours)      Critically ill patients:  140 - 180 mg/dL  Results for SHONNIE, POUDRIER (MRN [id number]) as of 04/24/2017 10:27  Ref. Range 04/23/2017 17:02 04/23/2017 21:12 04/23/2017 23:19 04/24/2017 07:52  Glucose-Capillary Latest Ref Range: 65 - 99 mg/dL 205 (H) 214 (H) 206 (H) 236 (H)   Results for MARILLYN, GOREN (MRN [id number]) as of 04/24/2017 10:27  Ref. Range 04/23/2017 09:52 04/24/2017 02:18  Glucose Latest Ref Range: 65 - 99 mg/dL 147 (H) 188 (H)   Review of Glycemic Control  Diabetes history: DM2 Outpatient Diabetes medications: Bydureon 2 mg weekly (Sunday), Metformin 500 mg BID Current orders for Inpatient glycemic control: Novolog 0-9 units TID with meals, Metformin 500 mg BID; Solumedrol 60 mg Q6H  Inpatient Diabetes Program Recommendations Correction (SSI): Please consider increasing Novolog correction to Moderate scale (0-15 units) TID with meals and adding Novolog 0-5 units QHS for bedtime correction. Insulin - Meal Coverage: If steroids are continued, please consider ordering Novolog 4 units TID with meals for meal coverage if patient eats at least 50% of meals. HgbA1C: Please consider ordering an A1C to evaluate glycemic control over the past 2-3 months.  Thanks, Barnie Alderman, RN, MSN, CDE Diabetes Coordinator Inpatient Diabetes Program [phone] (Team Pager from 8am to 5pm)

## 2017-04-24 NOTE — Progress Notes (Signed)
[place] [facility] [place] NAME: Patty Leon    MR#:  [id number]  DATE OF BIRTH:  29-Sep-1960  SUBJECTIVE:  CHIEF COMPLAINT:   Chief Complaint  Patient presents with  . Cough  . Fever  . Shortness of Breath   Continues to have shortness of breath and weakness.  On 2 L oxygen.  Afebrile.  REVIEW OF SYSTEMS:    Review of Systems  Constitutional: Positive for malaise/fatigue. Negative for chills and fever.  HENT: Negative for sore throat.   Eyes: Negative for blurred vision, double vision and pain.  Respiratory: Positive for cough, shortness of breath and wheezing. Negative for hemoptysis.   Cardiovascular: Negative for chest pain, palpitations, orthopnea and leg swelling.  Gastrointestinal: Negative for abdominal pain, constipation, diarrhea, heartburn, nausea and vomiting.  Genitourinary: Negative for dysuria and hematuria.  Musculoskeletal: Negative for back pain and joint pain.  Skin: Negative for rash.  Neurological: Positive for weakness. Negative for sensory change, speech change, focal weakness and headaches.  Endo/Heme/Allergies: Does not bruise/bleed easily.  Psychiatric/Behavioral: Negative for depression. The patient is not nervous/anxious.     DRUG ALLERGIES:   Allergies  Allergen Reactions  . Celebrex [Celecoxib] Swelling  . Dilaudid [Hydromorphone] Itching    VITALS:  Blood pressure 118/65, pulse 96, temperature 97.9 F (36.6 C), temperature source Oral, resp. rate 18, height 5\' 2"  (1.575 m), weight 124.7 kg (275 lb), last menstrual period 04/24/2014, SpO2 95 %.  PHYSICAL EXAMINATION:   Physical Exam  GENERAL:  57 y.o.-year-old patient lying in the bed , with some conversational dyspnea.  Obese. EYES: Pupils equal, round, reactive to light and accommodation. No scleral icterus. Extraocular muscles intact.  HEENT: Head atraumatic, normocephalic. Oropharynx and nasopharynx clear.  NECK:  Supple, no jugular venous  distention. No thyroid enlargement, no tenderness.  LUNGS: Bilateral wheezing and decreased air entry CARDIOVASCULAR: S1, S2 normal. No murmurs, rubs, or gallops.  ABDOMEN: Soft, nontender, nondistended. Bowel sounds present. No organomegaly or mass.  EXTREMITIES: No cyanosis, clubbing or edema b/l.    NEUROLOGIC: Cranial nerves II through XII are intact. No focal Motor or sensory deficits b/l.   PSYCHIATRIC: The patient is alert and oriented x 3.  SKIN: No obvious rash, lesion, or ulcer.   LABORATORY PANEL:   CBC Recent Labs  Lab 04/24/17 0218  WBC 6.6  HGB 13.9  HCT 42.6  PLT 250   ------------------------------------------------------------------------------------------------------------------ Chemistries  Recent Labs  Lab 04/23/17 0952 04/24/17 0218  NA 133* 134*  K 3.1* 4.2  CL 86* 88*  CO2 34* 37*  GLUCOSE 147* 188*  BUN 11 12  CREATININE 0.77 0.76  CALCIUM 8.9 9.6  AST 22  --   ALT 15  --   ALKPHOS 102  --   BILITOT 0.7  --    ------------------------------------------------------------------------------------------------------------------  Cardiac Enzymes Recent Labs  Lab 04/23/17 0952  TROPONINI <0.03   ------------------------------------------------------------------------------------------------------------------  RADIOLOGY:  Dg Chest 2 View  Result Date: 04/23/2017 CLINICAL DATA:  Cough worsening shortness of breath over the past few days. Oxygen dependent. EXAM: CHEST  2 VIEW COMPARISON:  10/11/2016 FINDINGS: Lungs are adequately inflated without focal airspace consolidation or effusion. There is subtle prominence of the central bronchovascular markings which may be due to asthma or viral bronchopneumonia. Cardiomediastinal silhouette and remainder of the exam is unchanged. IMPRESSION: No focal airspace process. Subtle prominence of the central bronchovascular markings which may be seen with asthma versus viral bronchopneumonia. Electronically Signed    By:  Marin Olp M.D.   On: 04/23/2017 10:05   ASSESSMENT AND PLAN:   *Influenza a with acute COPD exacerbation with acute on chronic respiratory failure -IV steroids, Antibiotics, Tamiflu - Scheduled Nebulizers - Inhalers -Wean O2 as tolerated - Consult pulmonary if no improvement  *Diabetes mellitus.  Continue sliding scale insulin.  Will add pre-meal insulin while on IV steroids.  IV steroid dose decreased today  *Hypertension.  Continue home medications.  *DVT prophylaxis.  On Xarelto.  All the records are reviewed and case discussed with Care Management/Social Worker Management plans discussed with the patient, family and they are in agreement.  CODE STATUS: FULL CODE  DVT Prophylaxis: SCDs  TOTAL TIME TAKING CARE OF THIS PATIENT: 35 minutes.   POSSIBLE D/C IN 1-2 DAYS, DEPENDING ON CLINICAL CONDITION.  Neita Carp M.D on 04/24/2017 at 11:53 AM  Between 7am to 6pm - Pager - [phone]  After 6pm go to [email or web address] - password [email or web address] [facility]  Office  [phone]  CC: Primary care physician; Danelle Berry, NP  Note: This dictation was prepared with Dragon dictation along with smaller phrase technology. Any transcriptional errors that result from this process are unintentional.

## 2017-04-25 LAB — URINALYSIS, ROUTINE W REFLEX MICROSCOPIC
BILIRUBIN URINE: NEGATIVE
Glucose, UA: NEGATIVE mg/dL
Ketones, ur: NEGATIVE mg/dL
Leukocytes, UA: NEGATIVE
Nitrite: NEGATIVE
PH: 6 (ref 5.0–8.0)
Protein, ur: NEGATIVE mg/dL
SPECIFIC GRAVITY, URINE: 1.011 (ref 1.005–1.030)

## 2017-04-25 LAB — GLUCOSE, CAPILLARY: Glucose-Capillary: 149 mg/dL — ABNORMAL HIGH (ref 65–99)

## 2017-04-25 MED ORDER — PREDNISONE 10 MG PO TABS: 10.0000 mg | ORAL_TABLET | Freq: Every day | ORAL | 0 refills | Status: DC

## 2017-04-25 MED ORDER — HYDROCOD POLST-CPM POLST ER 10-8 MG/5ML PO SUER: 5.0000 mL | Freq: Two times a day (BID) | ORAL | 0 refills | Status: DC | PRN

## 2017-04-25 MED ORDER — OSELTAMIVIR PHOSPHATE 75 MG PO CAPS: 75.0000 mg | ORAL_CAPSULE | Freq: Two times a day (BID) | ORAL | 0 refills | Status: DC

## 2017-04-25 NOTE — Discharge Instructions (Addendum)
Resume diet and activity as before ° ° °

## 2017-04-26 LAB — URINE CULTURE

## 2017-04-28 LAB — CULTURE, BLOOD (ROUTINE X 2)
CULTURE: NO GROWTH
CULTURE: NO GROWTH

## 2017-05-02 NOTE — Discharge Summary (Signed)
[place] [facility] [place] NAME: Patty Leon    MR#:  [id number]  DATE OF BIRTH:  October 29, 1960  DATE OF ADMISSION:  04/23/2017 ADMITTING PHYSICIAN: Dustin Flock, MD  DATE OF DISCHARGE: 04/25/2017 11:00 AM  PRIMARY CARE PHYSICIAN: Danelle Berry, NP   ADMISSION DIAGNOSIS:  Shortness of breath [R06.02] Influenza A [J10.1] Hypoxia [R09.02]  DISCHARGE DIAGNOSIS:  Active Problems:   COPD with acute exacerbation ([place])   SECONDARY DIAGNOSIS:   Past Medical History:  Diagnosis Date  . Anxiety   . Arthritis   . COPD (chronic obstructive pulmonary disease) ([place])   . Diabetes mellitus without complication ([place])   . Diverticulosis   . Fluid retention   . GERD (gastroesophageal reflux disease)   . History of blood clots   . Hypercholesteremia   . Hypertension   . Panic attacks   . Shortness of breath dyspnea   . Sleep apnea    Use C-PAP with oxygen     ADMITTING HISTORY  HISTORY OF PRESENT ILLNESS: Patty Leon  is a 57 y.o. female with a known history of COPD, diabetes type 2, GERD, essential hypertension, history of DVT, hyperlipidemia and sleep apnea presenting to the hospital with complaint of progressive shortness of breath ongoing for the past few days.  She complains of sputum that is clear in color.  She has also been having progressive wheezing.  She reports that she has oxygen at nighttime with her CPAP machine but she has been using it intermittently during the day as well.  She has not had any nausea vomiting or diarrhea.  In the ER she is noted to have a flu.     HOSPITAL COURSE:   *Influenza a with acute COPD exacerbation with acute on chronic respiratory failure Treated with IV steroids, nebulizers and Tamiflu.  Continued on oxygen and weaned down to her baseline by the day of discharge.  She feels significantly better and on the day of discharge was feeling close to baseline.  She will be discharged home on prednisone,  Tamiflu.  Has nebulizers at home.  *   Uncontrolled diabetes mellitus with hyperglycemia due to IV steroids improved with decrease in steroid dose.  *Hypertension.  Continue home medications.  stable for dc home  CONSULTS OBTAINED:    DRUG ALLERGIES:   Allergies  Allergen Reactions  . Celebrex [Celecoxib] Swelling  . Dilaudid [Hydromorphone] Itching    DISCHARGE MEDICATIONS:   Allergies as of 04/25/2017      Reactions   Celebrex [celecoxib] Swelling   Dilaudid [hydromorphone] Itching      Medication List    TAKE these medications   ADVAIR DISKUS 250-50 MCG/DOSE Aepb Generic drug:  Fluticasone-Salmeterol Inhale 1 puff into the lungs 2 (two) times daily.   amLODipine 5 MG tablet Commonly known as:  NORVASC Take 5 mg by mouth every morning.   BYDUREON BCISE 2 MG/0.85ML Auij Generic drug:  Exenatide ER Inject 2 mg into the skin every Sunday.   chlorpheniramine-HYDROcodone 10-8 MG/5ML Suer Commonly known as:  TUSSIONEX PENNKINETIC ER Take 5 mLs by mouth every 12 (twelve) hours as needed for cough.   CRESTOR 10 MG tablet Generic drug:  rosuvastatin Take 10 mg by mouth every evening.   DALIRESP 500 MCG Tabs tablet Generic drug:  roflumilast Take 500 mcg by mouth every morning.   docusate sodium 100 MG capsule Commonly known as:  COLACE Take 100 mg by mouth daily.   GRALISE 600 MG Tabs  Generic drug:  Gabapentin (Once-Daily) Take 1,800 mg by mouth daily as needed (for pain.).   hydrochlorothiazide 25 MG tablet Commonly known as:  HYDRODIURIL Take 25 mg by mouth daily.   KLOR-CON 10 10 MEQ tablet Generic drug:  potassium chloride Take 10 mEq by mouth every Monday, Wednesday, and Friday.   lidocaine 5 % Commonly known as:  LIDODERM Place 1 patch onto the skin every 12 (twelve) hours. Remove & Discard patch within 12 hours or as directed by MD   metFORMIN 500 MG tablet Commonly known as:  GLUCOPHAGE Take 500 mg by mouth 2 (two) times daily.    methocarbamol 500 MG tablet Commonly known as:  ROBAXIN Take 1 tablet (500 mg total) by mouth 4 (four) times daily.   metoprolol tartrate 50 MG tablet Commonly known as:  LOPRESSOR Take 50 mg by mouth every morning.   omeprazole 40 MG capsule Commonly known as:  PRILOSEC Take 40 mg by mouth every morning.   oseltamivir 75 MG capsule Commonly known as:  TAMIFLU Take 1 capsule (75 mg total) by mouth 2 (two) times daily.   Oxycodone HCl 20 MG Tabs Take 15 mg by mouth every 6 (six) hours as needed.   polyethylene glycol powder powder Commonly known as:  GLYCOLAX/MIRALAX Take 17 g by mouth daily as needed for mild constipation.   predniSONE 10 MG tablet Commonly known as:  DELTASONE Take 1 tablet (10 mg total) by mouth daily.   PROAIR HFA 108 (90 Base) MCG/ACT inhaler Generic drug:  albuterol Inhale 2 puffs into the lungs every 6 (six) hours as needed for wheezing.   SPIRIVA HANDIHALER 18 MCG inhalation capsule Generic drug:  tiotropium Place 18 mcg into inhaler and inhale daily.   Vitamin D 2000 units Caps Take 2,000 Units by mouth daily.   XARELTO 20 MG Tabs tablet Generic drug:  rivaroxaban Take 20 mg by mouth daily.       Today   VITAL SIGNS:  Blood pressure 113/64, pulse 98, temperature 97.6 F (36.4 C), temperature source Oral, resp. rate 18, height 5\' 2"  (1.575 m), weight 124.7 kg (275 lb), last menstrual period 04/24/2014, SpO2 93 %.  I/O:  No intake or output data in the 24 hours ending 05/02/17 1519  PHYSICAL EXAMINATION:  Physical Exam  GENERAL:  57 y.o.-year-old patient lying in the bed with no acute distress.  LUNGS: Normal breath sounds bilaterally, no wheezing, rales,rhonchi or crepitation. No use of accessory muscles of respiration.  CARDIOVASCULAR: S1, S2 normal. No murmurs, rubs, or gallops.  ABDOMEN: Soft, non-tender, non-distended. Bowel sounds present. No organomegaly or mass.  NEUROLOGIC: Moves all 4 extremities. PSYCHIATRIC: The  patient is alert and oriented x 3.  SKIN: No obvious rash, lesion, or ulcer.   DATA REVIEW:   CBC No results for input(s): WBC, HGB, HCT, PLT in the last 168 hours.  Chemistries  No results for input(s): NA, K, CL, CO2, GLUCOSE, BUN, CREATININE, CALCIUM, MG, AST, ALT, ALKPHOS, BILITOT in the last 168 hours.  Invalid input(s): GFRCGP  Cardiac Enzymes No results for input(s): TROPONINI in the last 168 hours.  Microbiology Results  Results for orders placed or performed during the hospital encounter of 04/23/17  Blood Culture (routine x 2)     Status: None   Collection Time: 04/23/17  9:52 AM  Result Value Ref Range Status   Specimen Description BLOOD LEFT ANTECUBITAL  Final   Special Requests   Final    BOTTLES DRAWN AEROBIC AND ANAEROBIC Blood  Culture results may not be optimal due to an excessive volume of blood received in culture bottles   Culture   Final    NO GROWTH 5 DAYS Performed at [facility], [address] [place], [place] [address]    Report Status 04/28/2017 FINAL  Final  Blood Culture (routine x 2)     Status: None   Collection Time: 04/23/17  9:52 AM  Result Value Ref Range Status   Specimen Description BLOOD BLOOD LEFT ARM  Final   Special Requests   Final    BOTTLES DRAWN AEROBIC AND ANAEROBIC Blood Culture results may not be optimal due to an excessive volume of blood received in culture bottles   Culture   Final    NO GROWTH 5 DAYS Performed at [facility], [address], [place], [place] [address]    Report Status 04/28/2017 FINAL  Final  Urine culture     Status: Abnormal   Collection Time: 04/25/17  6:52 AM  Result Value Ref Range Status   Specimen Description   Final    URINE, RANDOM Performed at [facility], [address], [place], [place] [address]    Special Requests   Final    NONE Performed at [facility], [address] [place],  [address]    Culture MULTIPLE SPECIES PRESENT, SUGGEST  RECOLLECTION (A)  Final   Report Status 04/26/2017 FINAL  Final    RADIOLOGY:  No results found.  Follow up with PCP in 1 week.  Management plans discussed with the patient, family and they are in agreement.  CODE STATUS:  Code Status History    Date Active Date Inactive Code Status Order ID Comments User Context   04/23/2017 12:36 04/25/2017 14:50 Full Code [id number]  Dustin Flock, MD ED   02/08/2016 13:37 02/11/2016 14:02 Full Code [id number]  Hessie Knows, MD Inpatient   10/07/2015 18:31 10/10/2015 15:58 Full Code [id number]  Hessie Knows, MD Inpatient      TOTAL TIME TAKING CARE OF THIS PATIENT ON DAY OF DISCHARGE: more than 30 minutes.   Leia Alf Verne Lanuza M.D on 05/02/2017 at 3:19 PM  Between 7am to 6pm - Pager - [phone]  After 6pm go to [email or web address] - password [email or web address] [facility]  Office  [phone]  CC: Primary care physician; Danelle Berry, NP  Note: This dictation was prepared with Dragon dictation along with smaller phrase technology. Any transcriptional errors that result from this process are unintentional.

## 2017-08-23 ENCOUNTER — Ambulatory Visit: Payer: Medicaid Other | Attending: Specialist

## 2017-08-23 DIAGNOSIS — G4733 Obstructive sleep apnea (adult) (pediatric): Secondary | ICD-10-CM | POA: Diagnosis not present

## 2017-09-03 ENCOUNTER — Other Ambulatory Visit: Payer: Self-pay | Admitting: Nurse Practitioner

## 2017-09-03 DIAGNOSIS — Z1231 Encounter for screening mammogram for malignant neoplasm of breast: Secondary | ICD-10-CM

## 2017-09-21 ENCOUNTER — Ambulatory Visit
Admission: RE | Admit: 2017-09-21 | Discharge: 2017-09-21 | Disposition: A | Discharge status: Home / Self Care | Payer: Medicaid Other | Source: Ambulatory Visit | Attending: Nurse Practitioner | Admitting: Nurse Practitioner

## 2017-09-21 DIAGNOSIS — Z1231 Encounter for screening mammogram for malignant neoplasm of breast: Secondary | ICD-10-CM | POA: Insufficient documentation

## 2017-10-15 ENCOUNTER — Other Ambulatory Visit: Payer: Self-pay

## 2017-10-15 ENCOUNTER — Encounter: Payer: Self-pay | Admitting: Emergency Medicine

## 2017-10-15 ENCOUNTER — Emergency Department
Admission: EM | Admit: 2017-10-15 | Discharge: 2017-10-15 | Disposition: A | Discharge status: Home / Self Care | Payer: Medicaid Other | Attending: Emergency Medicine | Admitting: Emergency Medicine

## 2017-10-15 DIAGNOSIS — J449 Chronic obstructive pulmonary disease, unspecified: Secondary | ICD-10-CM | POA: Diagnosis not present

## 2017-10-15 DIAGNOSIS — Z7984 Long term (current) use of oral hypoglycemic drugs: Secondary | ICD-10-CM | POA: Diagnosis not present

## 2017-10-15 DIAGNOSIS — F17210 Nicotine dependence, cigarettes, uncomplicated: Secondary | ICD-10-CM | POA: Diagnosis not present

## 2017-10-15 DIAGNOSIS — I10 Essential (primary) hypertension: Secondary | ICD-10-CM | POA: Insufficient documentation

## 2017-10-15 DIAGNOSIS — Z79899 Other long term (current) drug therapy: Secondary | ICD-10-CM | POA: Diagnosis not present

## 2017-10-15 DIAGNOSIS — E119 Type 2 diabetes mellitus without complications: Secondary | ICD-10-CM | POA: Insufficient documentation

## 2017-10-15 DIAGNOSIS — Z96652 Presence of left artificial knee joint: Secondary | ICD-10-CM | POA: Insufficient documentation

## 2017-10-15 DIAGNOSIS — M545 Low back pain, unspecified: Secondary | ICD-10-CM

## 2017-10-15 DIAGNOSIS — Z7901 Long term (current) use of anticoagulants: Secondary | ICD-10-CM | POA: Diagnosis not present

## 2017-10-15 MED ORDER — BACLOFEN 10 MG PO TABS: 10.0000 mg | ORAL_TABLET | Freq: Every day | ORAL | 1 refills | Status: AC

## 2017-10-15 MED ORDER — PREDNISONE 10 MG (21) PO TBPK: ORAL_TABLET | ORAL | 0 refills | Status: DC

## 2017-10-15 MED ORDER — CYCLOBENZAPRINE HCL 10 MG PO TABS
10.0000 mg | ORAL_TABLET | Freq: Once | ORAL | Status: AC
  Administered 2017-10-15: 10 mg via ORAL
  Filled 2017-10-15: qty 1

## 2017-10-15 MED ORDER — KETOROLAC TROMETHAMINE 30 MG/ML IJ SOLN
30.0000 mg | Freq: Once | INTRAMUSCULAR | Status: AC
  Administered 2017-10-15: 30 mg via INTRAMUSCULAR
  Filled 2017-10-15: qty 1

## 2017-10-15 NOTE — ED Notes (Signed)
.  FIRST NURSE NOTE: Back pain pt is in wheelchair at this time.

## 2017-10-15 NOTE — Discharge Instructions (Addendum)
Follow-up with your regular doctor if not better in 3 to 5 days.  Return emergency department if worsening.  Take medication as prescribed.  Apply ice to the lower back for the next 3 days then he may switch to wet heat followed by ice.  Try to be up and active as this will help with some of the lower back pain.  When you are sitting your spine is taking the brunt of all of the weight.  Return if needed

## 2017-10-15 NOTE — ED Provider Notes (Signed)
[facility] Emergency Department Provider Note  ____________________________________________   First MD Initiated Contact with Patient 10/15/17 1210     (approximate)  I have reviewed the triage vital signs and the nursing notes.   HISTORY  Chief Complaint Back Pain    HPI Patty Leon is a 57 y.o. female presents emergency department C/o low back pain for 1-2 day, no known injury, pain is worse with movement, increased with bending over, pain radiates to both upper legs, she states it is a stinging sensation denies numbness, tingling, or changes in bowel/urinary habits,  Using otc meds without relief; she states this happens about once a year and she has to have a shot of Toradol and then go on steroids and muscle relaxers.  She denies any difficulty with urination Remainder ros neg   Past Medical History:  Diagnosis Date  . Anxiety   . Arthritis   . COPD (chronic obstructive pulmonary disease) ([place])   . Diabetes mellitus without complication ([place])   . Diverticulosis   . Fluid retention   . GERD (gastroesophageal reflux disease)   . History of blood clots   . Hypercholesteremia   . Hypertension   . Panic attacks   . Shortness of breath dyspnea   . Sleep apnea    Use C-PAP with oxygen    Patient Active Problem List   Diagnosis Date Noted  . COPD with acute exacerbation ([place]) 04/23/2017  . Failed total knee arthroplasty ([place]) 02/08/2016  . Primary localized osteoarthritis of left knee 10/07/2015    Past Surgical History:  Procedure Laterality Date  . BILATERAL CARPAL TUNNEL RELEASE    . COLONOSCOPY    . COLONOSCOPY N/A 01/22/2015   Procedure: COLONOSCOPY;  Surgeon: Josefine Class, MD;  Location: [facility] ENDOSCOPY;  Service: Endoscopy;  Laterality: N/A;  . COLONOSCOPY WITH PROPOFOL N/A 07/12/2016   Procedure: COLONOSCOPY WITH PROPOFOL;  Surgeon: Manya Silvas, MD;  Location: [facility] ENDOSCOPY;  Service: Endoscopy;  Laterality: N/A;  .  JOINT REPLACEMENT Left 09/2015  . KNEE ARTHROSCOPY Left   . KNEE CLOSED REDUCTION Left 11/04/2015   Procedure: CLOSED MANIPULATION KNEE;  Surgeon: Hessie Knows, MD;  Location: [facility] ORS;  Service: Orthopedics;  Laterality: Left;  . KNEE CLOSED REDUCTION Left 04/04/2016   Procedure: CLOSED MANIPULATION KNEE;  Surgeon: Hessie Knows, MD;  Location: [facility] ORS;  Service: Orthopedics;  Laterality: Left;  . SHOULDER ARTHROSCOPY W/ ROTATOR CUFF REPAIR Right   . TOTAL KNEE ARTHROPLASTY Left 10/07/2015   Procedure: TOTAL KNEE ARTHROPLASTY;  Surgeon: Hessie Knows, MD;  Location: [facility] ORS;  Service: Orthopedics;  Laterality: Left;  . TOTAL KNEE REVISION Left 02/08/2016   Procedure: TOTAL KNEE REVISION;  Surgeon: Hessie Knows, MD;  Location: [facility] ORS;  Service: Orthopedics;  Laterality: Left;  . TUBAL LIGATION      Prior to Admission medications   Medication Sig Start Date End Date Taking? Authorizing Provider  albuterol (PROAIR HFA) 108 (90 Base) MCG/ACT inhaler Inhale 2 puffs into the lungs every 6 (six) hours as needed for wheezing.    [provider]  amLODipine (NORVASC) 5 MG tablet Take 5 mg by mouth every morning.     [provider]  baclofen (LIORESAL) 10 MG tablet Take 1 tablet (10 mg total) by mouth daily. 10/15/17 10/15/18  Caryn Section, Linden Dolin, PA-C  BYDUREON BCISE 2 MG/0.85ML AUIJ Inject 2 mg into the skin every Sunday. 04/09/17   [provider]  Cholecalciferol (VITAMIN D) 2000 units CAPS  Take 2,000 Units by mouth daily.    [provider]  docusate sodium (COLACE) 100 MG capsule Take 100 mg by mouth daily. 08/12/15   [provider]  Fluticasone-Salmeterol (ADVAIR DISKUS) 250-50 MCG/DOSE AEPB Inhale 1 puff into the lungs 2 (two) times daily.    [provider]  GRALISE 600 MG TABS Take 1,800 mg by mouth daily as needed (for pain.).  11/25/15   [provider]  hydrochlorothiazide (HYDRODIURIL) 25 MG tablet Take 25 mg by mouth daily. 09/13/15    [provider]  KLOR-CON 10 10 MEQ tablet Take 10 mEq by mouth every Monday, Wednesday, and Friday. 02/24/17   [provider]  metFORMIN (GLUCOPHAGE) 500 MG tablet Take 500 mg by mouth 2 (two) times daily. 07/26/15   [provider]  metoprolol (LOPRESSOR) 50 MG tablet Take 50 mg by mouth every morning.  08/29/15   [provider]  omeprazole (PRILOSEC) 40 MG capsule Take 40 mg by mouth every morning.  07/27/15   [provider]  Oxycodone HCl 20 MG TABS Take 15 mg by mouth every 6 (six) hours as needed.  02/29/16   [provider]  polyethylene glycol powder (GLYCOLAX/MIRALAX) powder Take 17 g by mouth daily as needed for mild constipation.  10/27/15   [provider]  predniSONE (DELTASONE) 10 MG tablet Take 1 tablet (10 mg total) by mouth daily. 04/25/17   Hillary Bow, MD  predniSONE (STERAPRED UNI-PAK 21 TAB) 10 MG (21) TBPK tablet Take 6 pills on day one then decrease by 1 pill each day 10/15/17   Versie Starks, PA-C  roflumilast (DALIRESP) 500 MCG TABS tablet Take 500 mcg by mouth every morning.     [provider]  rosuvastatin (CRESTOR) 10 MG tablet Take 10 mg by mouth every evening. 11/05/13   [provider]  SPIRIVA HANDIHALER 18 MCG inhalation capsule Place 18 mcg into inhaler and inhale daily. 03/27/17   [provider]  XARELTO 20 MG TABS tablet Take 20 mg by mouth daily. 03/01/16   [provider]    Allergies Celebrex [celecoxib] and Dilaudid [hydromorphone]  Family History  Problem Relation Age of Onset  . Lung cancer Mother   . Ovarian cancer Paternal Grandmother   . Breast cancer Neg Hx     Social History Social History   Tobacco Use  . Smoking status: Current Every Day Smoker    Packs/day: 0.50    Years: 30.00    Pack years: 15.00    Types: Cigarettes  . Smokeless tobacco: Never Used  Substance Use Topics  . Alcohol use: No  . Drug use: Yes    Types: Oxycodone     Comment: as prescribed by MD    Review of Systems  Constitutional: No fever/chills Eyes: No visual changes. ENT: No sore throat. Respiratory: Denies cough Genitourinary: Negative for dysuria. Musculoskeletal: Positive for back pain. Skin: Negative for rash.    ____________________________________________   PHYSICAL EXAM:  VITAL SIGNS: ED Triage Vitals  Enc Vitals Group     BP 10/15/17 1140 (!) 111/54     Pulse Rate 10/15/17 1140 90     Resp 10/15/17 1140 18     Temp 10/15/17 1140 98.6 F (37 C)     Temp Source 10/15/17 1140 Oral     SpO2 10/15/17 1140 92 %     Weight 10/15/17 1140 285 lb (129.3 kg)     Height 10/15/17 1140 5\' 2"  (1.575 m)  Head Circumference --      Peak Flow --      Pain Score 10/15/17 1147 10     Pain Loc --      Pain Edu? --      Excl. in [place]? --     Constitutional: Alert and oriented. Well appearing and in no acute distress. Eyes: Conjunctivae are normal.  Head: Atraumatic. Nose: No congestion/rhinnorhea. Mouth/Throat: Mucous membranes are moist.   Neck:  supple no lymphadenopathy noted Cardiovascular: Normal rate, regular rhythm. Heart sounds are normal Respiratory: Normal respiratory effort.  No retractions, lungs c t a  Abd: soft nontender bs normal all 4 quad GU: deferred Musculoskeletal: FROM all extremities, warm and well perfused.  Decreased rom of back due to discomfort, lumbar spine tender in the paravertebral muscles only, the spine is not tender, negative Slr, 10 out of 10 strength in great toes b/l, 10 out of 10 strength in lower legs, n/v intact Neurologic:  Normal speech and language.  Skin:  Skin is warm, dry and intact. No rash noted. Psychiatric: Mood and affect are normal. Speech and behavior are normal.  ____________________________________________   LABS (all labs ordered are listed, but only abnormal results are displayed)  Labs Reviewed - No data to  display ____________________________________________   ____________________________________________  RADIOLOGY    ____________________________________________   PROCEDURES  Procedure(s) performed: Toradol 30 mg IM  Procedures    ____________________________________________   INITIAL IMPRESSION / ASSESSMENT AND PLAN / ED COURSE  Pertinent labs & imaging results that were available during my care of the patient were reviewed by me and considered in my medical decision making (see chart for details).   Patient is a 57 year old female presents emergency department complaining of low back pain which started this morning.  She states it was starting to hurt yesterday and then this morning she woke up with stinging pain across her lower back and into the upper legs.  She states this is happened before and she usually needs a shot of Toradol along with steroids and a muscle relaxer.  She states that her allergies listed say Celebrex but she is able to take Toradol without difficulty.  On physical exam patient is very obese.  The lumbar spine is not tender.  Paravertebral muscles and gluteal muscles are tender to palpation.  The remainder of the exam is unremarkable  Toradol 30 mg IM and Flexeril 10 mg p.o. was given to the patient.  She was given a prescription for Sterapred 10 mg 6-day Dosepak and baclofen.  She was instructed to apply ice to the lower back.  Apply wet heat in 3 days followed by ice.  Follow-up with her regular doctor if not better in 3 to 5 days.  Return if worsening.  She states she understands will comply with instructions.  Was discharged in stable condition     As part of my medical decision making, I reviewed the following data within the [place] notes reviewed and incorporated, Old chart reviewed, Notes from prior ED visits and [place] Controlled Substance Database  ____________________________________________   FINAL CLINICAL  IMPRESSION(S) / ED DIAGNOSES  Final diagnoses:  Acute midline low back pain without sciatica      NEW MEDICATIONS STARTED DURING THIS VISIT:  New Prescriptions   BACLOFEN (LIORESAL) 10 MG TABLET    Take 1 tablet (10 mg total) by mouth daily.   PREDNISONE (STERAPRED UNI-PAK 21 TAB) 10 MG (21) TBPK TABLET    Take 6  pills on day one then decrease by 1 pill each day     Note:  This document was prepared using Dragon voice recognition software and may include unintentional dictation errors.     Versie Starks, PA-C 10/15/17 1233    Lavonia Drafts, MD 10/15/17 1430

## 2017-10-15 NOTE — ED Triage Notes (Signed)
Patient reports back pain running down to both legs that started last night. Reports history of similar episodes. Denies any new activities or injury. Reports "it just happens sometimes". Patient sitting in wheelchair but appears uncomfortable.

## 2017-10-15 NOTE — ED Notes (Signed)
See triage note  Presents with lower back pain which started this am   States pain radiates into both legs  Unsure of any injury  Denies any urinary sx's   States she has a hx of same

## 2017-10-25 ENCOUNTER — Other Ambulatory Visit: Payer: Self-pay | Admitting: Neurosurgery

## 2017-10-25 DIAGNOSIS — M5116 Intervertebral disc disorders with radiculopathy, lumbar region: Secondary | ICD-10-CM

## 2017-11-20 ENCOUNTER — Ambulatory Visit
Admission: RE | Admit: 2017-11-20 | Discharge: 2017-11-20 | Disposition: A | Discharge status: Home / Self Care | Payer: Medicaid Other | Source: Ambulatory Visit | Attending: Neurosurgery | Admitting: Neurosurgery

## 2017-11-20 DIAGNOSIS — M5116 Intervertebral disc disorders with radiculopathy, lumbar region: Secondary | ICD-10-CM

## 2017-12-04 IMAGING — CR DG SHOULDER 2+V*L*
3 series · 3 of 3 positions shown · non-contrast
Comparison: Chest x-ray of June 11, 2014 which included a portion
of the left shoulder.

CLINICAL DATA: O awakened 3 days ago with chest and shoulder and
neck pain. No previous injuries.

EXAM:
LEFT SHOULDER - 2+ VIEW

[shoulder grashey]
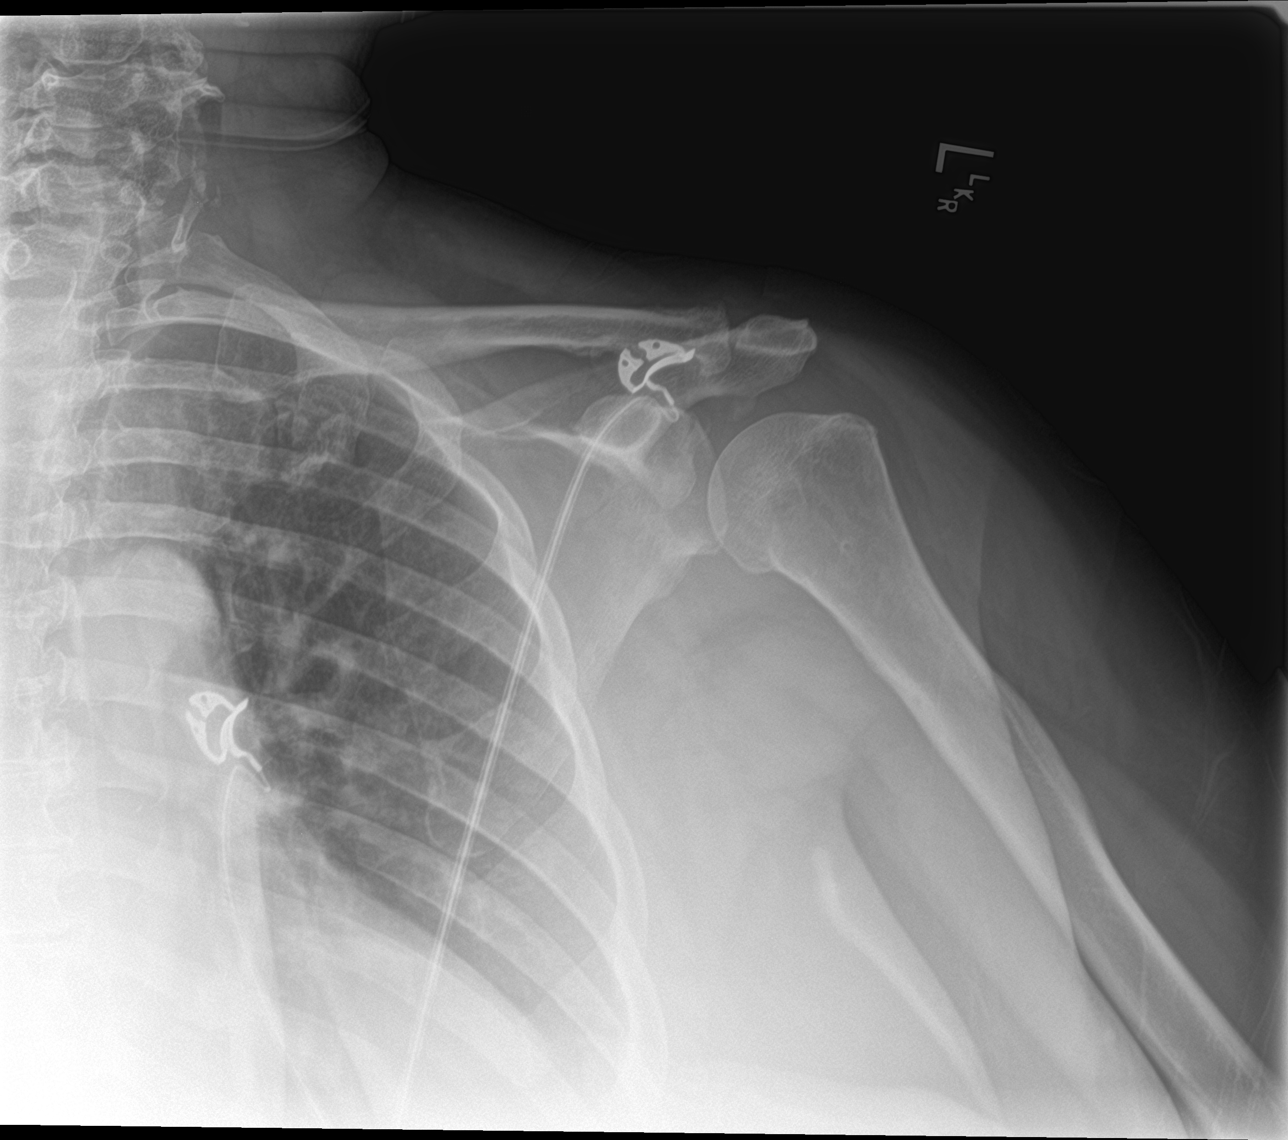

[shoulder y view]
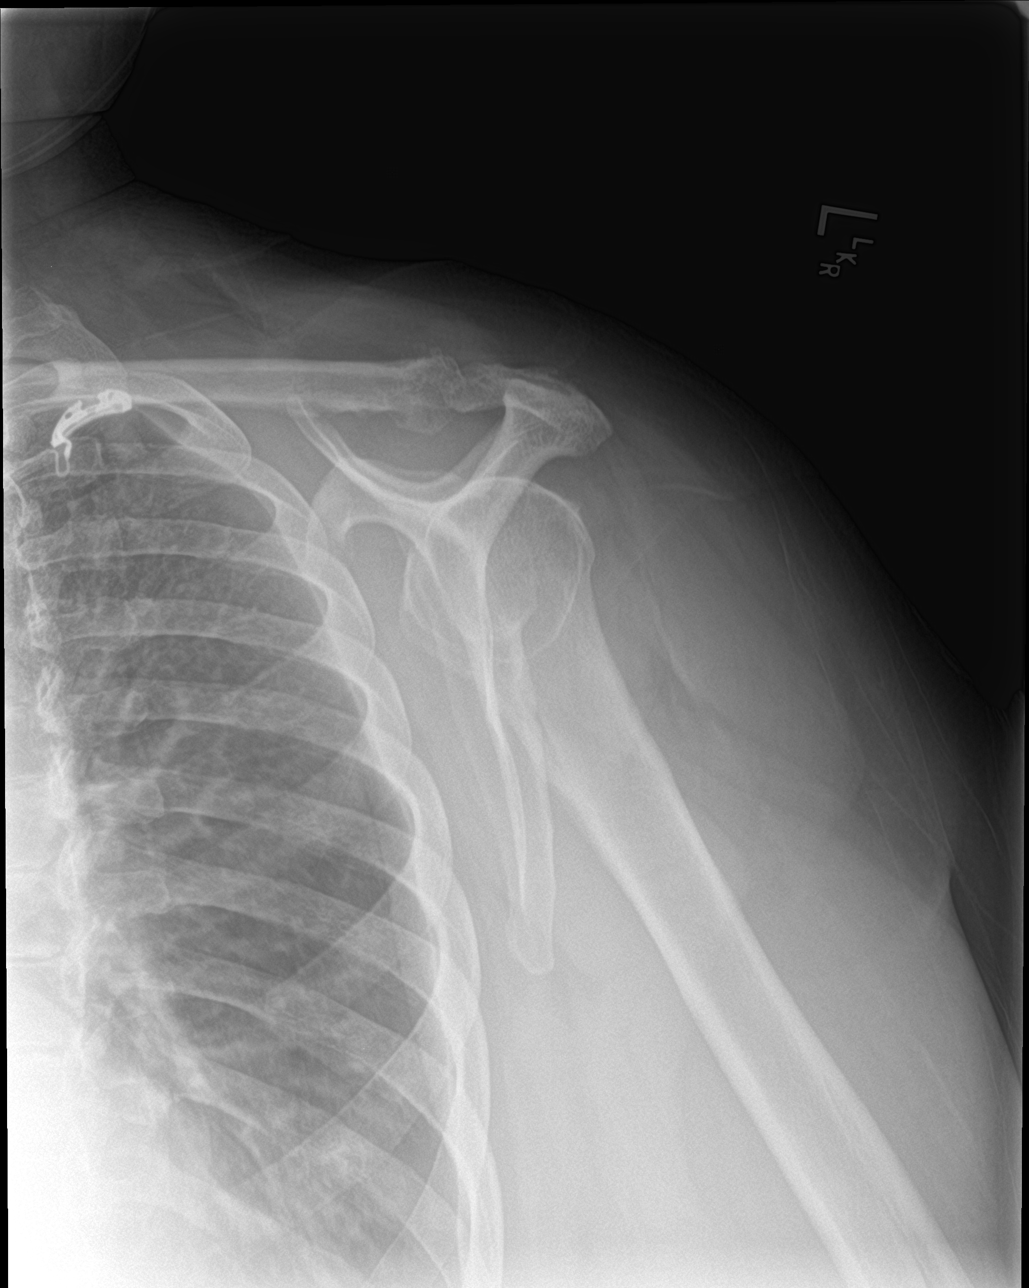

[shoulder axillary]
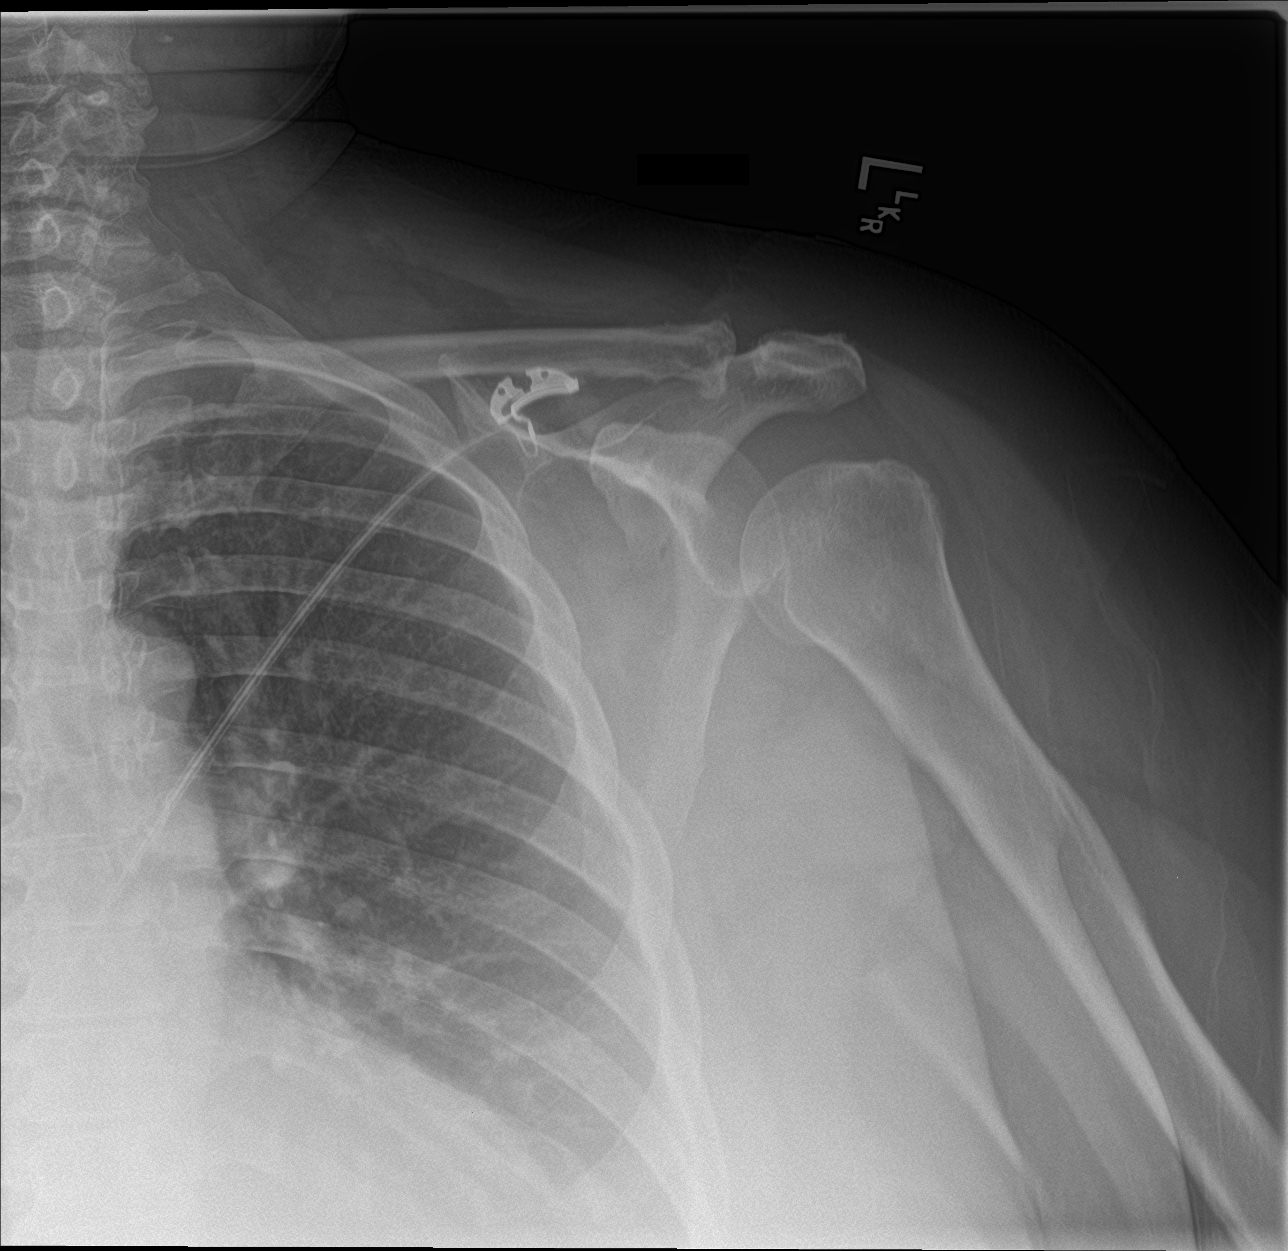

[3 of 3 positions shown; findings below may reference images not displayed]

FINDINGS: The bones are subjectively adequately mineralized. The glenohumeral
joint space appears normal. A large spur arises from the inferior
aspect of the distal clavicle. The AC joint space is reasonably
well-maintained. The bony glenoid and humeral head exhibit no acute
abnormalities.
IMPRESSION: Large spur arising from the inferior aspect of the distal clavicle
which may be impacting the rotator cuff. There is no acute bony
abnormality.

## 2017-12-20 ENCOUNTER — Ambulatory Visit: Admission: RE | Admit: 2017-12-20 | Payer: Medicaid Other | Source: Ambulatory Visit

## 2018-04-25 ENCOUNTER — Encounter: Payer: Self-pay | Admitting: *Deleted

## 2018-04-26 ENCOUNTER — Ambulatory Visit: Payer: Medicaid Other | Admitting: Anesthesiology

## 2018-04-26 ENCOUNTER — Encounter
Admission: RE | Disposition: A | Discharge status: Home / Self Care | Payer: Self-pay | Source: Home / Self Care | Attending: Unknown Physician Specialty

## 2018-04-26 ENCOUNTER — Encounter: Payer: Self-pay | Admitting: *Deleted

## 2018-04-26 ENCOUNTER — Ambulatory Visit
Admission: RE | Admit: 2018-04-26 | Discharge: 2018-04-26 | Disposition: A | Discharge status: Home / Self Care | Payer: Medicaid Other | Attending: Unknown Physician Specialty | Admitting: Unknown Physician Specialty

## 2018-04-26 DIAGNOSIS — Z79899 Other long term (current) drug therapy: Secondary | ICD-10-CM | POA: Insufficient documentation

## 2018-04-26 DIAGNOSIS — G4730 Sleep apnea, unspecified: Secondary | ICD-10-CM | POA: Insufficient documentation

## 2018-04-26 DIAGNOSIS — Z86718 Personal history of other venous thrombosis and embolism: Secondary | ICD-10-CM | POA: Diagnosis not present

## 2018-04-26 DIAGNOSIS — D509 Iron deficiency anemia, unspecified: Secondary | ICD-10-CM | POA: Insufficient documentation

## 2018-04-26 DIAGNOSIS — Z6841 Body Mass Index (BMI) 40.0 and over, adult: Secondary | ICD-10-CM | POA: Diagnosis not present

## 2018-04-26 DIAGNOSIS — K921 Melena: Secondary | ICD-10-CM | POA: Diagnosis not present

## 2018-04-26 DIAGNOSIS — Z7984 Long term (current) use of oral hypoglycemic drugs: Secondary | ICD-10-CM | POA: Diagnosis not present

## 2018-04-26 DIAGNOSIS — E119 Type 2 diabetes mellitus without complications: Secondary | ICD-10-CM | POA: Diagnosis not present

## 2018-04-26 DIAGNOSIS — I10 Essential (primary) hypertension: Secondary | ICD-10-CM | POA: Insufficient documentation

## 2018-04-26 DIAGNOSIS — Z7951 Long term (current) use of inhaled steroids: Secondary | ICD-10-CM | POA: Insufficient documentation

## 2018-04-26 DIAGNOSIS — F17210 Nicotine dependence, cigarettes, uncomplicated: Secondary | ICD-10-CM | POA: Insufficient documentation

## 2018-04-26 DIAGNOSIS — E7800 Pure hypercholesterolemia, unspecified: Secondary | ICD-10-CM | POA: Diagnosis not present

## 2018-04-26 DIAGNOSIS — J449 Chronic obstructive pulmonary disease, unspecified: Secondary | ICD-10-CM | POA: Diagnosis not present

## 2018-04-26 DIAGNOSIS — F410 Panic disorder [episodic paroxysmal anxiety] without agoraphobia: Secondary | ICD-10-CM | POA: Diagnosis not present

## 2018-04-26 DIAGNOSIS — Z96652 Presence of left artificial knee joint: Secondary | ICD-10-CM | POA: Insufficient documentation

## 2018-04-26 DIAGNOSIS — K5730 Diverticulosis of large intestine without perforation or abscess without bleeding: Secondary | ICD-10-CM | POA: Insufficient documentation

## 2018-04-26 DIAGNOSIS — K640 First degree hemorrhoids: Secondary | ICD-10-CM | POA: Diagnosis not present

## 2018-04-26 DIAGNOSIS — K449 Diaphragmatic hernia without obstruction or gangrene: Secondary | ICD-10-CM | POA: Diagnosis not present

## 2018-04-26 DIAGNOSIS — K219 Gastro-esophageal reflux disease without esophagitis: Secondary | ICD-10-CM | POA: Diagnosis not present

## 2018-04-26 DIAGNOSIS — Z791 Long term (current) use of non-steroidal anti-inflammatories (NSAID): Secondary | ICD-10-CM | POA: Insufficient documentation

## 2018-04-26 DIAGNOSIS — F419 Anxiety disorder, unspecified: Secondary | ICD-10-CM | POA: Insufficient documentation

## 2018-04-26 DIAGNOSIS — Z9981 Dependence on supplemental oxygen: Secondary | ICD-10-CM | POA: Diagnosis not present

## 2018-04-26 DIAGNOSIS — Z8041 Family history of malignant neoplasm of ovary: Secondary | ICD-10-CM | POA: Insufficient documentation

## 2018-04-26 DIAGNOSIS — K2970 Gastritis, unspecified, without bleeding: Secondary | ICD-10-CM | POA: Diagnosis not present

## 2018-04-26 DIAGNOSIS — D125 Benign neoplasm of sigmoid colon: Secondary | ICD-10-CM | POA: Diagnosis not present

## 2018-04-26 DIAGNOSIS — Z888 Allergy status to other drugs, medicaments and biological substances status: Secondary | ICD-10-CM | POA: Insufficient documentation

## 2018-04-26 DIAGNOSIS — Z801 Family history of malignant neoplasm of trachea, bronchus and lung: Secondary | ICD-10-CM | POA: Insufficient documentation

## 2018-04-26 DIAGNOSIS — E669 Obesity, unspecified: Secondary | ICD-10-CM | POA: Insufficient documentation

## 2018-04-26 HISTORY — DX: Obesity, unspecified: E66.9

## 2018-04-26 HISTORY — PX: COLONOSCOPY WITH PROPOFOL: SHX5780

## 2018-04-26 HISTORY — PX: ESOPHAGOGASTRODUODENOSCOPY (EGD) WITH PROPOFOL: SHX5813

## 2018-04-26 SURGERY — ESOPHAGOGASTRODUODENOSCOPY (EGD) WITH PROPOFOL
Anesthesia: General

## 2018-04-26 MED ORDER — PROPOFOL 10 MG/ML IV BOLUS
INTRAVENOUS | Status: DC | PRN
  Administered 2018-04-26: 100 mg via INTRAVENOUS

## 2018-04-26 MED ORDER — PROPOFOL 500 MG/50ML IV EMUL
INTRAVENOUS | Status: AC
  Filled 2018-04-26: qty 50

## 2018-04-26 MED ORDER — GLYCOPYRROLATE 0.2 MG/ML IJ SOLN
INTRAMUSCULAR | Status: DC | PRN
  Administered 2018-04-26: 0.2 mg via INTRAVENOUS

## 2018-04-26 MED ORDER — LIDOCAINE HCL (CARDIAC) PF 100 MG/5ML IV SOSY
PREFILLED_SYRINGE | INTRAVENOUS | Status: DC | PRN
  Administered 2018-04-26: 100 mg via INTRATRACHEAL

## 2018-04-26 MED ORDER — GLYCOPYRROLATE 0.2 MG/ML IJ SOLN
INTRAMUSCULAR | Status: AC
  Filled 2018-04-26: qty 1

## 2018-04-26 MED ORDER — SODIUM CHLORIDE 0.9 % IV SOLN
INTRAVENOUS | Status: DC
  Administered 2018-04-26: 1000 mL via INTRAVENOUS

## 2018-04-26 MED ORDER — GENTAMICIN SULFATE 40 MG/ML IJ SOLN
100.0000 mg | Freq: Once | INTRAVENOUS | Status: DC
  Filled 2018-04-26: qty 2.5

## 2018-04-26 MED ORDER — VANCOMYCIN HCL IN DEXTROSE 1-5 GM/200ML-% IV SOLN: 1000.0000 mg | Freq: Once | INTRAVENOUS | Status: DC

## 2018-04-26 MED ORDER — LIDOCAINE HCL (PF) 2 % IJ SOLN
INTRAMUSCULAR | Status: AC
  Filled 2018-04-26: qty 10

## 2018-04-26 MED ORDER — SODIUM CHLORIDE 0.9 % IV SOLN: INTRAVENOUS | Status: DC

## 2018-04-26 MED ORDER — PIPERACILLIN-TAZOBACTAM 3.375 G IVPB
INTRAVENOUS | Status: AC
  Administered 2018-04-26: 3.375 g via INTRAVENOUS
  Filled 2018-04-26: qty 50

## 2018-04-26 MED ORDER — PIPERACILLIN-TAZOBACTAM 3.375 G IVPB: 3.3750 g | Freq: Once | INTRAVENOUS | Status: DC

## 2018-04-26 MED ORDER — VANCOMYCIN HCL IN DEXTROSE 1-5 GM/200ML-% IV SOLN
INTRAVENOUS | Status: AC
  Filled 2018-04-26: qty 200

## 2018-04-26 MED ORDER — PROPOFOL 500 MG/50ML IV EMUL
INTRAVENOUS | Status: DC | PRN
  Administered 2018-04-26: 120 ug/kg/min via INTRAVENOUS

## 2018-04-26 NOTE — H&P (Signed)
Primary Care Physician:  Danelle Berry, NP Primary Gastroenterologist:  Dr. Vira Agar  Pre-Procedure History & Physical: HPI:  Patty MOUSER is a 58 y.o. female is here for an endoscopy and colonoscopy.   Past Medical History:  Diagnosis Date  . Anxiety   . Arthritis   . COPD (chronic obstructive pulmonary disease) ([place])   . Diabetes mellitus without complication ([place])   . Diverticulosis   . Fluid retention   . GERD (gastroesophageal reflux disease)   . History of blood clots   . Hypercholesteremia   . Hypertension   . Obesity   . Panic attacks   . Shortness of breath dyspnea   . Sleep apnea    Use C-PAP with oxygen    Past Surgical History:  Procedure Laterality Date  . BILATERAL CARPAL TUNNEL RELEASE    . COLONOSCOPY    . COLONOSCOPY N/A 01/22/2015   Procedure: COLONOSCOPY;  Surgeon: Josefine Class, MD;  [facility] ENDOSCOPY;  Service: Endoscopy;  Laterality: N/A;  . COLONOSCOPY WITH PROPOFOL N/A 07/12/2016   Procedure: COLONOSCOPY WITH PROPOFOL;  Surgeon: Manya Silvas, MD;  Location: [facility] ENDOSCOPY;  Service: Endoscopy;  Laterality: N/A;  . JOINT REPLACEMENT Left 09/2015  . KNEE ARTHROSCOPY Left   . KNEE CLOSED REDUCTION Left 11/04/2015   Procedure: CLOSED MANIPULATION KNEE;  Surgeon: Hessie Knows, MD;  Location: [facility] ORS;  Service: Orthopedics;  Laterality: Left;  . KNEE CLOSED REDUCTION Left 04/04/2016   Procedure: CLOSED MANIPULATION KNEE;  Surgeon: Hessie Knows, MD;  Location: [facility] ORS;  Service: Orthopedics;  Laterality: Left;  . SHOULDER ARTHROSCOPY W/ ROTATOR CUFF REPAIR Right   . TOTAL KNEE ARTHROPLASTY Left 10/07/2015   Procedure: TOTAL KNEE ARTHROPLASTY;  Surgeon: Hessie Knows, MD;  Location: [facility] ORS;  Service: Orthopedics;  Laterality: Left;  . TOTAL KNEE REVISION Left 02/08/2016   Procedure: TOTAL KNEE REVISION;  Surgeon: Hessie Knows, MD;  Location: [facility] ORS;  Service: Orthopedics;  Laterality: Left;  . TUBAL LIGATION      Prior to  Admission medications   Medication Sig Start Date End Date Taking? Authorizing Provider  amLODipine (NORVASC) 5 MG tablet Take 5 mg by mouth every morning.    Yes [provider]  BYDUREON BCISE 2 MG/0.85ML AUIJ Inject 2 mg into the skin every Sunday. 04/09/17  Yes [provider]  Cholecalciferol (VITAMIN D) 2000 units CAPS Take 2,000 Units by mouth daily.   Yes [provider]  docusate sodium (COLACE) 100 MG capsule Take 100 mg by mouth daily. 08/12/15  Yes [provider]  hydrochlorothiazide (HYDRODIURIL) 25 MG tablet Take 25 mg by mouth daily. 09/13/15  Yes [provider]  KLOR-CON 10 10 MEQ tablet Take 10 mEq by mouth every Monday, Wednesday, and Friday. 02/24/17  Yes [provider]  metFORMIN (GLUCOPHAGE) 500 MG tablet Take 500 mg by mouth 2 (two) times daily. 07/26/15  Yes [provider]  metoprolol (LOPRESSOR) 50 MG tablet Take 50 mg by mouth every morning.  08/29/15  Yes [provider]  omeprazole (PRILOSEC) 40 MG capsule Take 40 mg by mouth every morning.  07/27/15  Yes [provider]  OXYGEN Inhale into the lungs.   Yes [provider]  polyethylene glycol powder (GLYCOLAX/MIRALAX) powder Take 17 g by mouth daily as needed for mild constipation.  10/27/15  Yes [provider]  predniSONE (DELTASONE) 10 MG tablet Take 1 tablet (10 mg total) by mouth daily. 04/25/17  Yes Hillary Bow, MD  roflumilast (DALIRESP) 500 MCG TABS tablet Take 500 mcg by mouth every morning.    Yes [provider]  rosuvastatin (CRESTOR) 10 MG tablet Take 10 mg by mouth every evening. 11/05/13  Yes [provider]  XARELTO 20 MG TABS tablet Take 20 mg by mouth daily. 03/01/16  Yes [provider]  albuterol (PROAIR HFA) 108 (90 Base) MCG/ACT inhaler Inhale 2 puffs into the lungs every 6 (six) hours as needed for wheezing.    [provider]  baclofen (LIORESAL) 10 MG tablet Take 1  tablet (10 mg total) by mouth daily. Patient not taking: Reported on 04/26/2018 10/15/17 10/15/18  Caryn Section Linden Dolin, PA-C  Fluticasone-Salmeterol (ADVAIR DISKUS) 250-50 MCG/DOSE AEPB Inhale 1 puff into the lungs 2 (two) times daily.    [provider]  GRALISE 600 MG TABS Take 1,800 mg by mouth daily as needed (for pain.).  11/25/15   [provider]  Oxycodone HCl 20 MG TABS Take 15 mg by mouth every 6 (six) hours as needed.  02/29/16   [provider]  predniSONE (STERAPRED UNI-PAK 21 TAB) 10 MG (21) TBPK tablet Take 6 pills on day one then decrease by 1 pill each day 10/15/17   Versie Starks, PA-C  SPIRIVA HANDIHALER 18 MCG inhalation capsule Place 18 mcg into inhaler and inhale daily. 03/27/17   [provider]    Allergies as of 01/24/2018 - Review Complete 10/15/2017  Allergen Reaction Noted  . Celebrex [celecoxib] Swelling 10/23/2014  . Dilaudid [hydromorphone] Itching 04/04/2016    Family History  Problem Relation Age of Onset  . Lung cancer Mother   . Ovarian cancer Paternal Grandmother   . Breast cancer Neg Hx     Social History   Socioeconomic History  . Marital status: Married    Spouse name: Not on file  . Number of children: Not on file  . Years of education: Not on file  . Highest education level: Not on file  Occupational History  . Not on file  Social Needs  . Financial resource strain: Not on file  . Food insecurity:    Worry: Not on file    Inability: Not on file  . Transportation needs:    Medical: Not on file    Non-medical: Not on file  Tobacco Use  . Smoking status: Current Every Day Smoker    Packs/day: 0.50    Years: 30.00    Pack years: 15.00    Types: Cigarettes  . Smokeless tobacco: Never Used  Substance and Sexual Activity  . Alcohol use: No  . Drug use: Yes    Types: Oxycodone    Comment: as prescribed by MD  . Sexual activity: Yes  Lifestyle  . Physical activity:    Days per week: Not on file    Minutes  per session: Not on file  . Stress: Not on file  Relationships  . Social connections:    Talks on phone: Not on file    Gets together: Not on file    Attends religious service: Not on file    Active member of club or organization: Not on file    Attends meetings of clubs or organizations: Not on file    Relationship status: Not on file  . Intimate partner violence:    Fear of current or ex partner: Not on file    Emotionally abused: Not on file    Physically abused: Not on file    Forced sexual activity:  Not on file  Other Topics Concern  . Not on file  Social History Narrative  . Not on file    Review of Systems: See HPI, otherwise negative ROS  Physical Exam: BP 123/90   Pulse 87   Temp (!) 96.7 F (35.9 C) (Tympanic)   Resp 20   Ht 5\' 2"  (1.575 m)   Wt 126.6 kg   LMP 04/24/2014 (Approximate)   SpO2 95%   BMI 51.03 kg/m  General:   Alert,  pleasant and cooperative in NAD Head:  Normocephalic and atraumatic. Neck:  Supple; no masses or thyromegaly. Lungs:  Clear throughout to auscultation.    Heart:  Regular rate and rhythm. Abdomen:  Soft, nontender and nondistended. Normal bowel sounds, without guarding, and without rebound.   Neurologic:  Alert and  oriented x4;  grossly normal neurologically.  Impression/Plan: TABITHA Leon is here for an endoscopy and colonoscopy to be performed for Heme positive stool and PH colon polyps.  Risks, benefits, limitations, and alternatives regarding  endoscopy and colonoscopy have been reviewed with the patient.  Questions have been answered.  All parties agreeable.   Gaylyn Cheers, MD  04/26/2018, 8:45 AM

## 2018-04-26 NOTE — Op Note (Signed)
[facility] Gastroenterology Patient Name: Patty Leon Procedure Date: 04/26/2018 8:46 AM MRN: [id number] Account #: [id number] Date of Birth: Aug 09, 1960 Admit Type: Outpatient Age: 58 Room: [facility] ENDO ROOM 3 Gender: Female Note Status: Finalized Procedure:            Upper GI endoscopy Indications:          Heartburn, Iron deficiency anemia Providers:            Manya Silvas, MD Referring MD:         No Local Md, MD (Referring MD) Medicines:            Propofol per Anesthesia Complications:        No immediate complications. Procedure:            Pre-Anesthesia Assessment:                       - After reviewing the risks and benefits, the patient                        was deemed in satisfactory condition to undergo the                        procedure.                       After obtaining informed consent, the endoscope was                        passed under direct vision. Throughout the procedure,                        the patient's blood pressure, pulse, and oxygen                        saturations were monitored continuously. The Endoscope                        was introduced through the mouth, and advanced to the                        second part of duodenum. The upper GI endoscopy was                        somewhat difficult due to the patient's body habitus.                        The patient tolerated the procedure. Findings:      The examined esophagus was normal.      Localized mildly erythematous mucosa without bleeding was found in the       gastric antrum.      A medium-sized hiatal hernia was present.      The examined duodenum was normal. Impression:           - Normal esophagus.                       - Erythematous mucosa in the antrum.                       - Medium-sized hiatal hernia.                       -  Normal examined duodenum.                       - No specimens collected. Recommendation:       - Perform a colonoscopy  today. Manya Silvas, MD 04/26/2018 9:01:23 AM This report has been signed electronically. Number of Addenda: 0 Note Initiated On: 04/26/2018 8:46 AM      [facility]

## 2018-04-26 NOTE — Anesthesia Post-op Follow-up Note (Signed)
Anesthesia QCDR form completed.        

## 2018-04-26 NOTE — Anesthesia Postprocedure Evaluation (Signed)
Anesthesia Post Note  Patient: RIELLE SCHLAUCH  Procedure(s) Performed: ESOPHAGOGASTRODUODENOSCOPY (EGD) WITH PROPOFOL (N/A ) COLONOSCOPY WITH PROPOFOL (N/A )  Patient location during evaluation: Endoscopy Anesthesia Type: General Level of consciousness: awake and alert and oriented Pain management: pain level controlled Vital Signs Assessment: post-procedure vital signs reviewed and stable Respiratory status: spontaneous breathing, nonlabored ventilation and respiratory function stable Cardiovascular status: blood pressure returned to baseline and stable Postop Assessment: no signs of nausea or vomiting Anesthetic complications: no     Last Vitals:  Vitals:   04/26/18 0949 04/26/18 1005  BP: 102/78 128/78  Pulse: 89   Resp: (!) 21 20  Temp:    SpO2: 98%     Last Pain:  Vitals:   04/26/18 0949  TempSrc:   PainSc: 0-No pain                 Taron Conrey

## 2018-04-26 NOTE — Anesthesia Preprocedure Evaluation (Signed)
Anesthesia Evaluation  Patient identified by MRN, date of birth, ID band Patient awake    Reviewed: Allergy & Precautions, NPO status , Patient's Chart, lab work & pertinent test results  History of Anesthesia Complications Negative for: history of anesthetic complications  Airway Mallampati: III  TM Distance: >3 FB Neck ROM: Full    Dental  (+) Upper Dentures   Pulmonary sleep apnea and Continuous Positive Airway Pressure Ventilation , COPD,  COPD inhaler, Current Smoker,    breath sounds clear to auscultation- rhonchi (-) wheezing      Cardiovascular hypertension, Pt. on medications (-) CAD, (-) Past MI, (-) Cardiac Stents and (-) CABG  Rhythm:Regular Rate:Normal - Systolic murmurs and - Diastolic murmurs    Neuro/Psych neg Seizures Anxiety negative neurological ROS     GI/Hepatic Neg liver ROS, GERD  ,  Endo/Other  diabetes, Oral Hypoglycemic Agents  Renal/GU negative Renal ROS     Musculoskeletal  (+) Arthritis ,   Abdominal (+) + obese,   Peds  Hematology negative hematology ROS (+)   Anesthesia Other Findings Past Medical History: No date: Anxiety No date: Arthritis No date: COPD (chronic obstructive pulmonary disease) (HCC) No date: Diabetes mellitus without complication (HCC) No date: Diverticulosis No date: Fluid retention No date: GERD (gastroesophageal reflux disease) No date: History of blood clots No date: Hypercholesteremia No date: Hypertension No date: Obesity No date: Panic attacks No date: Shortness of breath dyspnea No date: Sleep apnea     Comment:  Use C-PAP with oxygen   Reproductive/Obstetrics                             Anesthesia Physical Anesthesia Plan  ASA: III  Anesthesia Plan: General   Post-op Pain Management:    Induction: Intravenous  PONV Risk Score and Plan: 1 and Propofol infusion  Airway Management Planned: Natural  Airway  Additional Equipment:   Intra-op Plan:   Post-operative Plan:   Informed Consent: I have reviewed the patients History and Physical, chart, labs and discussed the procedure including the risks, benefits and alternatives for the proposed anesthesia with the patient or authorized representative who has indicated his/her understanding and acceptance.     Dental advisory given  Plan Discussed with: CRNA and Anesthesiologist  Anesthesia Plan Comments:         Anesthesia Quick Evaluation

## 2018-04-26 NOTE — Transfer of Care (Signed)
Immediate Anesthesia Transfer of Care Note  Patient: Patty Leon  Procedure(s) Performed: ESOPHAGOGASTRODUODENOSCOPY (EGD) WITH PROPOFOL (N/A ) COLONOSCOPY WITH PROPOFOL (N/A )  Patient Location: PACU and Endoscopy Unit  Anesthesia Type:General  Level of Consciousness: awake, alert  and oriented  Airway & Oxygen Therapy: Patient Spontanous Breathing and Patient connected to nasal cannula oxygen  Post-op Assessment: Report given to RN and Post -op Vital signs reviewed and stable  Post vital signs: Reviewed and stable  Last Vitals:  Vitals Value Taken Time  BP 94/64 04/26/2018  9:30 AM  Temp 36 C 04/26/2018  9:29 AM  Pulse 95 04/26/2018  9:33 AM  Resp 23 04/26/2018  9:33 AM  SpO2 96 % 04/26/2018  9:33 AM  Vitals shown include unvalidated device data.  Last Pain:  Vitals:   04/26/18 0929  TempSrc: Tympanic  PainSc:          Complications: No apparent anesthesia complications

## 2018-04-26 NOTE — Op Note (Signed)
[facility] Gastroenterology Patient Name: Patty Leon Procedure Date: 04/26/2018 8:45 AM MRN: [id number] Account #: [id number] Date of Birth: August 21, 1960 Admit Type: Outpatient Age: 58 Room: [facility] ENDO ROOM 3 Gender: Female Note Status: Finalized Procedure:            Colonoscopy Indications:          Heme positive stool, Iron deficiency anemia Providers:            Manya Silvas, MD Referring MD:         No Local Md, MD (Referring MD) Medicines:            Propofol per Anesthesia Complications:        No immediate complications. Procedure:            Pre-Anesthesia Assessment:                       - After reviewing the risks and benefits, the patient                        was deemed in satisfactory condition to undergo the                        procedure.                       After obtaining informed consent, the colonoscope was                        passed under direct vision. Throughout the procedure,                        the patient's blood pressure, pulse, and oxygen                        saturations were monitored continuously. The                        Colonoscope was introduced through the anus and                        advanced to the the cecum, identified by appendiceal                        orifice and ileocecal valve. The colonoscopy was                        performed without difficulty. The patient tolerated the                        procedure well. The quality of the bowel preparation                        was good. Findings:      Three sessile polyps were found in the sigmoid colon. The polyps were       small in size. These polyps were removed with a hot snare. Resection and       retrieval were complete.      Internal hemorrhoids were found during endoscopy. The hemorrhoids were       small and Grade I (internal hemorrhoids that do not prolapse).  The exam was otherwise without abnormality. Impression:           - Three  small polyps in the sigmoid colon, removed with                        a hot snare. Resected and retrieved.                       - Internal hemorrhoids.                       - The examination was otherwise normal. Recommendation:       - Await pathology results. Manya Silvas, MD 04/26/2018 9:27:44 AM This report has been signed electronically. Number of Addenda: 0 Note Initiated On: 04/26/2018 8:45 AM Scope Withdrawal Time: 0 hours 18 minutes 14 seconds  Total Procedure Duration: 0 hours 21 minutes 46 seconds       [facility]

## 2018-04-29 LAB — SURGICAL PATHOLOGY

## 2018-08-16 ENCOUNTER — Other Ambulatory Visit: Payer: Self-pay | Admitting: Nurse Practitioner

## 2018-08-16 DIAGNOSIS — Z1231 Encounter for screening mammogram for malignant neoplasm of breast: Secondary | ICD-10-CM

## 2018-10-02 ENCOUNTER — Other Ambulatory Visit: Payer: Self-pay

## 2018-10-02 ENCOUNTER — Ambulatory Visit
Admission: RE | Admit: 2018-10-02 | Discharge: 2018-10-02 | Disposition: A | Discharge status: Home / Self Care | Payer: Medicaid Other | Source: Ambulatory Visit | Attending: Nurse Practitioner | Admitting: Nurse Practitioner

## 2018-10-02 DIAGNOSIS — Z1231 Encounter for screening mammogram for malignant neoplasm of breast: Secondary | ICD-10-CM | POA: Insufficient documentation

## 2018-10-02 DIAGNOSIS — Z20822 Contact with and (suspected) exposure to covid-19: Secondary | ICD-10-CM

## 2018-10-05 LAB — NOVEL CORONAVIRUS, NAA: SARS-CoV-2, NAA: NOT DETECTED

## 2018-12-13 ENCOUNTER — Other Ambulatory Visit: Payer: Self-pay

## 2018-12-13 ENCOUNTER — Emergency Department
Admission: EM | Admit: 2018-12-13 | Discharge: 2018-12-13 | Disposition: A | Discharge status: Home / Self Care | Payer: Medicaid Other | Attending: Emergency Medicine | Admitting: Emergency Medicine

## 2018-12-13 DIAGNOSIS — Z79899 Other long term (current) drug therapy: Secondary | ICD-10-CM | POA: Diagnosis not present

## 2018-12-13 DIAGNOSIS — M5442 Lumbago with sciatica, left side: Secondary | ICD-10-CM | POA: Insufficient documentation

## 2018-12-13 DIAGNOSIS — M545 Low back pain: Secondary | ICD-10-CM | POA: Diagnosis present

## 2018-12-13 DIAGNOSIS — I10 Essential (primary) hypertension: Secondary | ICD-10-CM | POA: Diagnosis not present

## 2018-12-13 DIAGNOSIS — F17210 Nicotine dependence, cigarettes, uncomplicated: Secondary | ICD-10-CM | POA: Diagnosis not present

## 2018-12-13 DIAGNOSIS — J449 Chronic obstructive pulmonary disease, unspecified: Secondary | ICD-10-CM | POA: Insufficient documentation

## 2018-12-13 DIAGNOSIS — Z7984 Long term (current) use of oral hypoglycemic drugs: Secondary | ICD-10-CM | POA: Diagnosis not present

## 2018-12-13 DIAGNOSIS — E119 Type 2 diabetes mellitus without complications: Secondary | ICD-10-CM | POA: Diagnosis not present

## 2018-12-13 MED ORDER — PREDNISONE 10 MG (21) PO TBPK: ORAL_TABLET | ORAL | 0 refills | Status: DC

## 2018-12-13 MED ORDER — KETOROLAC TROMETHAMINE 30 MG/ML IJ SOLN
30.0000 mg | Freq: Once | INTRAMUSCULAR | Status: AC
  Administered 2018-12-13: 30 mg via INTRAMUSCULAR
  Filled 2018-12-13: qty 1

## 2018-12-13 NOTE — ED Notes (Signed)
O2 sats now 96% which is above pt normal. Pt is not SOB at this time.

## 2018-12-13 NOTE — ED Triage Notes (Signed)
Pt to the er for back pain that is running down into her buttocks and down the backs of both legs. Pt has a bulging disc and a hx of sciatica. Pt placed on 2L in triage due to O2 sats. Pt is on O2 at home but she states she is out of breath due to walking from the car.

## 2018-12-13 NOTE — ED Provider Notes (Signed)
[facility] Emergency Department Provider Note  ____________________________________________  Time seen: Approximately 8:25 PM  I have reviewed the triage vital signs and the nursing notes.   HISTORY  Chief Complaint Back Pain    HPI Patty Leon is a 58 y.o. female presents to the emergency department with left-sided low back pain that radiates into the buttocks and down the posterior aspect of the left lower extremity.  Patient denies falls or mechanisms of trauma.  Patient has a history of COPD and states that she uses 2 L of supplemental oxygen at home.  Patient reports that she became short of breath while ambulating from the parking lot to the emergency department but shortness of breath since improved.  Patient states that she has struggled with low back pain for a long time and that only prednisone relieves her pain.  Patient is requesting prednisone and states that she has muscle relaxers at home.  She is also requesting an injection of Toradol.        Past Medical History:  Diagnosis Date  . Anxiety   . Arthritis   . COPD (chronic obstructive pulmonary disease) ([place])   . Diabetes mellitus without complication ([place])   . Diverticulosis   . Fluid retention   . GERD (gastroesophageal reflux disease)   . History of blood clots   . Hypercholesteremia   . Hypertension   . Obesity   . Panic attacks   . Shortness of breath dyspnea   . Sleep apnea    Use C-PAP with oxygen    Patient Active Problem List   Diagnosis Date Noted  . COPD with acute exacerbation ([place]) 04/23/2017  . 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 Primary localized osteoarthritis of left knee 10/07/2015    Past Surgical History:  Procedure Laterality Date  . BILATERAL CARPAL TUNNEL RELEASE    . COLONOSCOPY    . COLONOSCOPY N/A 01/22/2015   Procedure: COLONOSCOPY;  Surgeon: Josefine Class, MD;  Location: [facility] ENDOSCOPY;  Service: Endoscopy;  Laterality:  N/A;  . COLONOSCOPY WITH PROPOFOL N/A 07/12/2016   Procedure: COLONOSCOPY WITH PROPOFOL;  Surgeon: Manya Silvas, MD;  Location: [facility] ENDOSCOPY;  Service: Endoscopy;  Laterality: N/A;  . COLONOSCOPY WITH PROPOFOL N/A 04/26/2018   Procedure: COLONOSCOPY WITH PROPOFOL;  Surgeon: Manya Silvas, MD;  Location: [facility] ENDOSCOPY;  Service: Endoscopy;  Laterality: N/A;  . ESOPHAGOGASTRODUODENOSCOPY (EGD) WITH PROPOFOL N/A 04/26/2018   Procedure: ESOPHAGOGASTRODUODENOSCOPY (EGD) WITH PROPOFOL;  Surgeon: Manya Silvas, MD;  Location: [facility] ENDOSCOPY;  Service: Endoscopy;  Laterality: N/A;  . JOINT REPLACEMENT Left 09/2015  . KNEE ARTHROSCOPY Left   . KNEE CLOSED REDUCTION Left 11/04/2015   Procedure: CLOSED MANIPULATION KNEE;  Surgeon: Hessie Knows, MD;  Location: [facility] ORS;  Service: Orthopedics;  Laterality: Left;  . KNEE CLOSED REDUCTION Left 04/04/2016   Procedure: CLOSED MANIPULATION KNEE;  Surgeon: Hessie Knows, MD;  Location: [facility] ORS;  Service: Orthopedics;  Laterality: Left;  . SHOULDER ARTHROSCOPY W/ ROTATOR CUFF REPAIR Right   . TOTAL KNEE ARTHROPLASTY Left 10/07/2015   Procedure: TOTAL KNEE ARTHROPLASTY;  Surgeon: Hessie Knows, MD;  Location: [facility] ORS;  Service: Orthopedics;  Laterality: Left;  . TOTAL KNEE REVISION Left 02/08/2016   Procedure: TOTAL KNEE REVISION;  Surgeon: Hessie Knows, MD;  Location: [facility] ORS;  Service: Orthopedics;  Laterality: Left;  . TUBAL LIGATION      Prior to Admission medications   Medication Sig Start Date End Date Taking? Authorizing Provider  albuterol (PROAIR HFA) 108 (90 Base) MCG/ACT inhaler Inhale 2 puffs into the lungs every 6 (six) hours as needed for wheezing.    [provider]  amLODipine (NORVASC) 5 MG tablet Take 5 mg by mouth every morning.     [provider]  BYDUREON BCISE 2 MG/0.85ML AUIJ Inject 2 mg into the skin every Sunday. 04/09/17   [provider]  Cholecalciferol (VITAMIN D) 2000 units CAPS Take 2,000 Units  by mouth daily.    [provider]  docusate sodium (COLACE) 100 MG capsule Take 100 mg by mouth daily. 08/12/15   [provider]  Fluticasone-Salmeterol (ADVAIR DISKUS) 250-50 MCG/DOSE AEPB Inhale 1 puff into the lungs 2 (two) times daily.    [provider]  GRALISE 600 MG TABS Take 1,800 mg by mouth daily as needed (for pain.).  11/25/15   [provider]  hydrochlorothiazide (HYDRODIURIL) 25 MG tablet Take 25 mg by mouth daily. 09/13/15   [provider]  KLOR-CON 10 10 MEQ tablet Take 10 mEq by mouth every Monday, Wednesday, and Friday. 02/24/17   [provider]  metFORMIN (GLUCOPHAGE) 500 MG tablet Take 500 mg by mouth 2 (two) times daily. 07/26/15   [provider]  metoprolol (LOPRESSOR) 50 MG tablet Take 50 mg by mouth every morning.  08/29/15   [provider]  omeprazole (PRILOSEC) 40 MG capsule Take 40 mg by mouth every morning.  07/27/15   [provider]  Oxycodone HCl 20 MG TABS Take 15 mg by mouth every 6 (six) hours as needed.  02/29/16   [provider]  OXYGEN Inhale into the lungs.    [provider]  polyethylene glycol powder (GLYCOLAX/MIRALAX) powder Take 17 g by mouth daily as needed for mild constipation.  10/27/15   [provider]  predniSONE (STERAPRED UNI-PAK 21 TAB) 10 MG (21) TBPK tablet Take 6 tablets daily for the first two days. Decrease by one tablet every two days until medications runs out. 12/13/18   Lannie Fields, PA-C  roflumilast (DALIRESP) 500 MCG TABS tablet Take 500 mcg by mouth every morning.     [provider]  rosuvastatin (CRESTOR) 10 MG tablet Take 10 mg by mouth every evening. 11/05/13   [provider]  SPIRIVA HANDIHALER 18 MCG inhalation capsule Place 18 mcg into inhaler and inhale daily. 03/27/17   [provider]  XARELTO 20 MG TABS tablet Take 20 mg by mouth daily. 03/01/16   [provider]     Allergies Celebrex [celecoxib] and Dilaudid [hydromorphone]  Family History  Problem Relation Age of Onset  . Lung cancer Mother   . Ovarian cancer Paternal Grandmother   . Breast cancer Neg Hx     Social History Social History   Tobacco Use  . Smoking status: Current Every Day Smoker    Packs/day: 0.50    Years: 30.00    Pack years: 15.00    Types: Cigarettes  . Smokeless tobacco: Never Used  Substance Use Topics  . Alcohol use: No  . Drug use: Not Currently    Types: Oxycodone    Comment: as prescribed by MD     Review of Systems  Constitutional: No fever/chills Eyes: No visual changes. No discharge ENT: No upper respiratory complaints. Cardiovascular: no chest pain. Respiratory: no cough. No SOB. Gastrointestinal: No abdominal pain.  No nausea, no vomiting.  No diarrhea.  No constipation. Genitourinary: Negative for dysuria. No hematuria Musculoskeletal: Patient has low back  pain.  Skin: Negative for rash, abrasions, lacerations, ecchymosis. Neurological: Negative for headaches, focal weakness or numbness.  ____________________________________________   PHYSICAL EXAM:  VITAL SIGNS: ED Triage Vitals  Enc Vitals Group     BP 12/13/18 1828 126/68     Pulse Rate 12/13/18 1828 (!) 119     Resp 12/13/18 1828 18     Temp 12/13/18 1828 98.2 F (36.8 C)     Temp Source 12/13/18 1828 Oral     SpO2 12/13/18 1828 (!) 84 %     Weight 12/13/18 1838 285 lb (129.3 kg)     Height 12/13/18 1838 5\' 2"  (1.575 m)     Head Circumference --      Peak Flow --      Pain Score 12/13/18 1837 9     Pain Loc --      Pain Edu? --      Excl. in [place]? --      Constitutional: Alert and oriented. Well appearing and in no acute distress. Eyes: Conjunctivae are normal. PERRL. EOMI. Head: Atraumatic. Cardiovascular: Normal rate, regular rhythm. Normal S1 and S2.  Good peripheral circulation. Respiratory: Normal respiratory effort without tachypnea or retractions. Lungs CTAB.  Good air entry to the bases with no decreased or absent breath sounds. Gastrointestinal: Bowel sounds 4 quadrants. Soft and nontender to palpation. No guarding or rigidity. No palpable masses. No distention. No CVA tenderness. Musculoskeletal: Full range of motion to all extremities. No gross deformities appreciated.  Patient has positive straight leg raise test on the left and paraspinal muscle tenderness along the lumbar spine. Neurologic:  Normal speech and language. No gross focal neurologic deficits are appreciated.  Skin:  Skin is warm, dry and intact. No rash noted. Psychiatric: Mood and affect are normal. Speech and behavior are normal. Patient exhibits appropriate insight and judgement.   ____________________________________________   LABS (all labs ordered are listed, but only abnormal results are displayed)  Labs Reviewed - No data to display ____________________________________________  EKG   ____________________________________________  RADIOLOGY   No results found.  ____________________________________________    PROCEDURES  Procedure(s) performed:    Procedures    Medications  ketorolac (TORADOL) 30 MG/ML injection 30 mg (30 mg Intramuscular Given 12/13/18 1942)     ____________________________________________   INITIAL IMPRESSION / ASSESSMENT AND PLAN / ED COURSE  Pertinent labs & imaging results that were available during my care of the patient were reviewed by me and considered in my medical decision making (see chart for details).  Review of the [place] CSRS was performed in accordance of the [place] prior to dispensing any controlled drugs.           Assessment and plan Low back pain 58 year old female presents to the emergency department with left-sided low back pain that radiates into the buttocks and the left lower extremity.  Patient had symmetric strength of the bilateral lower extremities and denied bowel or bladder incontinence or saddle  anesthesia.  Patient requested a prescription for prednisone as this is the only medication that has relieved her radiculopathy in the past.  I cautioned patient that I did not want her to have frequent prescriptions for prednisone.  Patient states that if she receives prednisone during this emergency department encounter, she will follow-up with primary care if pain reoccurs. All patient questions were answered.     ____________________________________________  FINAL CLINICAL IMPRESSION(S) / ED DIAGNOSES  Final diagnoses:  Acute bilateral low back pain with left-sided sciatica  NEW MEDICATIONS STARTED DURING THIS VISIT:  ED Discharge Orders         Ordered    predniSONE (STERAPRED UNI-PAK 21 TAB) 10 MG (21) TBPK tablet     12/13/18 1955              This chart was dictated using voice recognition software/Dragon. Despite best efforts to proofread, errors can occur which can change the meaning. Any change was purely unintentional.    Karren Cobble 12/13/18 2031    Blake Divine, MD 12/14/18 [phone]

## 2018-12-13 NOTE — ED Notes (Signed)
First Nurse Note: Pt ambulatory into ED c/o back pain. Pt is in NAD

## 2019-07-31 ENCOUNTER — Other Ambulatory Visit: Payer: Self-pay | Admitting: Nurse Practitioner

## 2019-07-31 DIAGNOSIS — Z1231 Encounter for screening mammogram for malignant neoplasm of breast: Secondary | ICD-10-CM

## 2019-10-03 ENCOUNTER — Ambulatory Visit
Admission: RE | Admit: 2019-10-03 | Discharge: 2019-10-03 | Disposition: A | Discharge status: Home / Self Care | Payer: Medicaid Other | Source: Ambulatory Visit | Attending: Nurse Practitioner | Admitting: Nurse Practitioner

## 2019-10-03 DIAGNOSIS — Z1231 Encounter for screening mammogram for malignant neoplasm of breast: Secondary | ICD-10-CM | POA: Diagnosis present

## 2020-06-03 ENCOUNTER — Emergency Department: Payer: Medicaid Other

## 2020-06-03 ENCOUNTER — Other Ambulatory Visit: Payer: Self-pay

## 2020-06-03 ENCOUNTER — Inpatient Hospital Stay
Admission: EM | Admit: 2020-06-03 | Discharge: 2020-06-05 | DRG: 190 | Disposition: A | Discharge status: Home Health | Payer: Medicaid Other | Attending: Internal Medicine | Admitting: Internal Medicine

## 2020-06-03 DIAGNOSIS — Z7984 Long term (current) use of oral hypoglycemic drugs: Secondary | ICD-10-CM

## 2020-06-03 DIAGNOSIS — M1990 Unspecified osteoarthritis, unspecified site: Secondary | ICD-10-CM | POA: Diagnosis present

## 2020-06-03 DIAGNOSIS — Z20822 Contact with and (suspected) exposure to covid-19: Secondary | ICD-10-CM | POA: Diagnosis present

## 2020-06-03 DIAGNOSIS — G8929 Other chronic pain: Secondary | ICD-10-CM | POA: Diagnosis present

## 2020-06-03 DIAGNOSIS — E1165 Type 2 diabetes mellitus with hyperglycemia: Secondary | ICD-10-CM

## 2020-06-03 DIAGNOSIS — E119 Type 2 diabetes mellitus without complications: Secondary | ICD-10-CM | POA: Diagnosis present

## 2020-06-03 DIAGNOSIS — J441 Chronic obstructive pulmonary disease with (acute) exacerbation: Secondary | ICD-10-CM | POA: Diagnosis present

## 2020-06-03 DIAGNOSIS — I10 Essential (primary) hypertension: Secondary | ICD-10-CM | POA: Diagnosis present

## 2020-06-03 DIAGNOSIS — Z888 Allergy status to other drugs, medicaments and biological substances status: Secondary | ICD-10-CM

## 2020-06-03 DIAGNOSIS — M1712 Unilateral primary osteoarthritis, left knee: Secondary | ICD-10-CM

## 2020-06-03 DIAGNOSIS — Z96652 Presence of left artificial knee joint: Secondary | ICD-10-CM | POA: Diagnosis present

## 2020-06-03 DIAGNOSIS — Z79899 Other long term (current) drug therapy: Secondary | ICD-10-CM

## 2020-06-03 DIAGNOSIS — E785 Hyperlipidemia, unspecified: Secondary | ICD-10-CM | POA: Diagnosis present

## 2020-06-03 DIAGNOSIS — Z801 Family history of malignant neoplasm of trachea, bronchus and lung: Secondary | ICD-10-CM

## 2020-06-03 DIAGNOSIS — G4733 Obstructive sleep apnea (adult) (pediatric): Secondary | ICD-10-CM | POA: Diagnosis present

## 2020-06-03 DIAGNOSIS — Z6841 Body Mass Index (BMI) 40.0 and over, adult: Secondary | ICD-10-CM | POA: Diagnosis not present

## 2020-06-03 DIAGNOSIS — Z7901 Long term (current) use of anticoagulants: Secondary | ICD-10-CM

## 2020-06-03 DIAGNOSIS — R6510 Systemic inflammatory response syndrome (SIRS) of non-infectious origin without acute organ dysfunction: Secondary | ICD-10-CM | POA: Diagnosis present

## 2020-06-03 DIAGNOSIS — F419 Anxiety disorder, unspecified: Secondary | ICD-10-CM | POA: Diagnosis present

## 2020-06-03 DIAGNOSIS — F17210 Nicotine dependence, cigarettes, uncomplicated: Secondary | ICD-10-CM | POA: Diagnosis present

## 2020-06-03 DIAGNOSIS — F410 Panic disorder [episodic paroxysmal anxiety] without agoraphobia: Secondary | ICD-10-CM | POA: Diagnosis present

## 2020-06-03 DIAGNOSIS — Z7951 Long term (current) use of inhaled steroids: Secondary | ICD-10-CM | POA: Diagnosis not present

## 2020-06-03 DIAGNOSIS — J9621 Acute and chronic respiratory failure with hypoxia: Secondary | ICD-10-CM | POA: Diagnosis present

## 2020-06-03 DIAGNOSIS — E7800 Pure hypercholesterolemia, unspecified: Secondary | ICD-10-CM | POA: Diagnosis present

## 2020-06-03 DIAGNOSIS — M546 Pain in thoracic spine: Secondary | ICD-10-CM | POA: Diagnosis present

## 2020-06-03 DIAGNOSIS — K219 Gastro-esophageal reflux disease without esophagitis: Secondary | ICD-10-CM | POA: Diagnosis present

## 2020-06-03 DIAGNOSIS — R0602 Shortness of breath: Secondary | ICD-10-CM | POA: Diagnosis not present

## 2020-06-03 DIAGNOSIS — Z8041 Family history of malignant neoplasm of ovary: Secondary | ICD-10-CM | POA: Diagnosis not present

## 2020-06-03 DIAGNOSIS — Z885 Allergy status to narcotic agent status: Secondary | ICD-10-CM

## 2020-06-03 LAB — BASIC METABOLIC PANEL
Anion gap: 12 (ref 5–15)
BUN: 10 mg/dL (ref 6–20)
CO2: 32 mmol/L (ref 22–32)
Calcium: 9.1 mg/dL (ref 8.9–10.3)
Chloride: 94 mmol/L — ABNORMAL LOW (ref 98–111)
Creatinine, Ser: 0.67 mg/dL (ref 0.44–1.00)
GFR, Estimated: >60 mL/min (ref >60)
Glucose, Bld: 153 mg/dL — ABNORMAL HIGH (ref 70–99)
Potassium: 4.1 mmol/L (ref 3.5–5.1)
Sodium: 138 mmol/L (ref 135–145)

## 2020-06-03 LAB — CBC
HCT: 49.2 % — ABNORMAL HIGH (ref 36.0–46.0)
Hemoglobin: 15.4 g/dL — ABNORMAL HIGH (ref 12.0–15.0)
MCH: 29.4 pg (ref 26.0–34.0)
MCHC: 31.3 g/dL (ref 30.0–36.0)
MCV: 94.1 fL (ref 80.0–100.0)
Platelets: 260 10*3/uL (ref 150–400)
RBC: 5.23 MIL/uL — ABNORMAL HIGH (ref 3.87–5.11)
RDW: 13.9 % (ref 11.5–15.5)
WBC: 8.5 10*3/uL (ref 4.0–10.5)
nRBC: 0 % (ref 0.0–0.2)

## 2020-06-03 LAB — D-DIMER, QUANTITATIVE: D-Dimer, Quant: 0.47 ug/mL-FEU (ref 0.00–0.50)

## 2020-06-03 LAB — TROPONIN I (HIGH SENSITIVITY)
Troponin I (High Sensitivity): 5 ng/L (ref <18)
Troponin I (High Sensitivity): 6 ng/L (ref <18)

## 2020-06-03 MED ORDER — METHYLPREDNISOLONE SODIUM SUCC 125 MG IJ SOLR: 60.0000 mg | Freq: Four times a day (QID) | INTRAMUSCULAR | Status: DC

## 2020-06-03 MED ORDER — ENOXAPARIN SODIUM 40 MG/0.4ML ~~LOC~~ SOLN: 40.0000 mg | SUBCUTANEOUS | Status: DC

## 2020-06-03 MED ORDER — METHYLPREDNISOLONE SODIUM SUCC 125 MG IJ SOLR
125.0000 mg | Freq: Once | INTRAMUSCULAR | Status: AC
  Administered 2020-06-03: 125 mg via INTRAVENOUS
  Filled 2020-06-03: qty 2

## 2020-06-03 MED ORDER — DOXYCYCLINE HYCLATE 100 MG PO TABS
100.0000 mg | ORAL_TABLET | Freq: Two times a day (BID) | ORAL | Status: DC
  Administered 2020-06-04: 100 mg via ORAL
  Filled 2020-06-03 (×2): qty 1

## 2020-06-03 MED ORDER — ALBUTEROL SULFATE (2.5 MG/3ML) 0.083% IN NEBU: 2.5000 mg | INHALATION_SOLUTION | RESPIRATORY_TRACT | Status: DC | PRN

## 2020-06-03 MED ORDER — MORPHINE SULFATE (PF) 4 MG/ML IV SOLN
4.0000 mg | Freq: Once | INTRAVENOUS | Status: AC
  Administered 2020-06-03: 4 mg via INTRAVENOUS
  Filled 2020-06-03: qty 1

## 2020-06-03 MED ORDER — INSULIN ASPART 100 UNIT/ML ~~LOC~~ SOLN
0.0000 [IU] | Freq: Three times a day (TID) | SUBCUTANEOUS | Status: DC
  Administered 2020-06-04: 7 [IU] via SUBCUTANEOUS
  Administered 2020-06-04: 4 [IU] via SUBCUTANEOUS
  Administered 2020-06-04: 11 [IU] via SUBCUTANEOUS
  Administered 2020-06-05: 7 [IU] via SUBCUTANEOUS
  Filled 2020-06-03 (×4): qty 1

## 2020-06-03 MED ORDER — IPRATROPIUM-ALBUTEROL 0.5-2.5 (3) MG/3ML IN SOLN
9.0000 mL | Freq: Once | RESPIRATORY_TRACT | Status: AC
  Administered 2020-06-03: 9 mL via RESPIRATORY_TRACT
  Filled 2020-06-03: qty 9

## 2020-06-03 MED ORDER — IPRATROPIUM-ALBUTEROL 0.5-2.5 (3) MG/3ML IN SOLN
3.0000 mL | Freq: Four times a day (QID) | RESPIRATORY_TRACT | Status: DC
  Administered 2020-06-04 – 2020-06-05 (×5): 3 mL via RESPIRATORY_TRACT
  Filled 2020-06-03 (×5): qty 3

## 2020-06-03 MED ORDER — PREDNISONE 20 MG PO TABS: 40.0000 mg | ORAL_TABLET | Freq: Every day | ORAL | Status: DC

## 2020-06-03 MED ORDER — INSULIN ASPART 100 UNIT/ML ~~LOC~~ SOLN
0.0000 [IU] | Freq: Every day | SUBCUTANEOUS | Status: DC
  Administered 2020-06-04: 2 [IU] via SUBCUTANEOUS
  Administered 2020-06-04: 3 [IU] via SUBCUTANEOUS
  Filled 2020-06-03 (×2): qty 1

## 2020-06-03 NOTE — ED Triage Notes (Signed)
Pt states that she has started to have shortness of breath today. Pt states she is on 3LPM of oxygen at home. Pt states history of COPD. Pt on 4lpm of oxygen in triage. Pt also endorses back pain

## 2020-06-03 NOTE — ED Notes (Signed)
hospitalist at bedside

## 2020-06-03 NOTE — ED Provider Notes (Signed)
[facility] Emergency Department Provider Note   ____________________________________________   Event Date/Time   First MD Initiated Contact with Patient 06/03/20 2050     (approximate)  I have reviewed the triage vital signs and the nursing notes.   HISTORY  Chief Complaint Shortness of Breath    HPI Patty Leon is a 60 y.o. female with past medical history of hypertension, hyperlipidemia, diabetes, and COPD who presents to the ED complaining of shortness of breath.  Patient reports that she has had increasing difficulty breathing over the past couple of days that became severe today.  She states she was unable to get up out of bed at home due to her level of shortness of breath.  She complains of a mild cough that is nonproductive, denies any fevers.  She does state that she has been dealing with increased pain in her upper back and while she usually deals with pain in her lower back, upper back pain is unusual for her.  She describes this as a sharp pain that has been constant throughout the day today.  She has not had any chest pain and denies any pain or swelling in her legs.  She had partial improvement with a breathing treatment at home today.        Past Medical History:  Diagnosis Date  . Anxiety   . Arthritis   . COPD (chronic obstructive pulmonary disease) ([place])   . Diabetes mellitus without complication ([place])   . Diverticulosis   . Fluid retention   . GERD (gastroesophageal reflux disease)   . History of blood clots   . Hypercholesteremia   . Hypertension   . Obesity   . Panic attacks   . Shortness of breath dyspnea   . Sleep apnea    Use C-PAP with oxygen    Patient Active Problem List   Diagnosis Date Noted  . COPD with acute exacerbation ([place]) 04/23/2017  . 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 Primary localized osteoarthritis of left knee 10/07/2015    Past Surgical History:  Procedure Laterality Date  .  BILATERAL CARPAL TUNNEL RELEASE    . COLONOSCOPY    . COLONOSCOPY N/A 01/22/2015   Procedure: COLONOSCOPY;  Surgeon: Josefine Class, MD;  Location: [facility] ENDOSCOPY;  Service: Endoscopy;  Laterality: N/A;  . COLONOSCOPY WITH PROPOFOL N/A 07/12/2016   Procedure: COLONOSCOPY WITH PROPOFOL;  Surgeon: Manya Silvas, MD;  Location: [facility] ENDOSCOPY;  Service: Endoscopy;  Laterality: N/A;  . COLONOSCOPY WITH PROPOFOL N/A 04/26/2018   Procedure: COLONOSCOPY WITH PROPOFOL;  Surgeon: Manya Silvas, MD;  Location: [facility] ENDOSCOPY;  Service: Endoscopy;  Laterality: N/A;  . ESOPHAGOGASTRODUODENOSCOPY (EGD) WITH PROPOFOL N/A 04/26/2018   Procedure: ESOPHAGOGASTRODUODENOSCOPY (EGD) WITH PROPOFOL;  Surgeon: Manya Silvas, MD;  Location: [facility] ENDOSCOPY;  Service: Endoscopy;  Laterality: N/A;  . JOINT REPLACEMENT Left 09/2015  . KNEE ARTHROSCOPY Left   . KNEE CLOSED REDUCTION Left 11/04/2015   Procedure: CLOSED MANIPULATION KNEE;  Surgeon: Hessie Knows, MD;  Location: [facility] ORS;  Service: Orthopedics;  Laterality: Left;  . KNEE CLOSED REDUCTION Left 04/04/2016   Procedure: CLOSED MANIPULATION KNEE;  Surgeon: Hessie Knows, MD;  Location: [facility] ORS;  Service: Orthopedics;  Laterality: Left;  . SHOULDER ARTHROSCOPY W/ ROTATOR CUFF REPAIR Right   . TOTAL KNEE ARTHROPLASTY Left 10/07/2015   Procedure: TOTAL KNEE ARTHROPLASTY;  Surgeon: Hessie Knows, MD;  Location: [facility] ORS;  Service: Orthopedics;  Laterality: Left;  . TOTAL KNEE REVISION  Left 02/08/2016   Procedure: TOTAL KNEE REVISION;  Surgeon: Hessie Knows, MD;  Location: [facility] ORS;  Service: Orthopedics;  Laterality: Left;  . TUBAL LIGATION      Prior to Admission medications   Medication Sig Start Date End Date Taking? Authorizing Provider  albuterol (PROAIR HFA) 108 (90 Base) MCG/ACT inhaler Inhale 2 puffs into the lungs every 6 (six) hours as needed for wheezing.    [provider]  amLODipine (NORVASC) 5 MG tablet Take 5 mg by mouth every  morning.     [provider]  BYDUREON BCISE 2 MG/0.85ML AUIJ Inject 2 mg into the skin every Sunday. 04/09/17   [provider]  Cholecalciferol (VITAMIN D) 2000 units CAPS Take 2,000 Units by mouth daily.    [provider]  docusate sodium (COLACE) 100 MG capsule Take 100 mg by mouth daily. 08/12/15   [provider]  Fluticasone-Salmeterol (ADVAIR DISKUS) 250-50 MCG/DOSE AEPB Inhale 1 puff into the lungs 2 (two) times daily.    [provider]  GRALISE 600 MG TABS Take 1,800 mg by mouth daily as needed (for pain.).  11/25/15   [provider]  hydrochlorothiazide (HYDRODIURIL) 25 MG tablet Take 25 mg by mouth daily. 09/13/15   [provider]  KLOR-CON 10 10 MEQ tablet Take 10 mEq by mouth every Monday, Wednesday, and Friday. 02/24/17   [provider]  metFORMIN (GLUCOPHAGE) 500 MG tablet Take 500 mg by mouth 2 (two) times daily. 07/26/15   [provider]  metoprolol (LOPRESSOR) 50 MG tablet Take 50 mg by mouth every morning.  08/29/15   [provider]  omeprazole (PRILOSEC) 40 MG capsule Take 40 mg by mouth every morning.  07/27/15   [provider]  Oxycodone HCl 20 MG TABS Take 15 mg by mouth every 6 (six) hours as needed.  02/29/16   [provider]  OXYGEN Inhale into the lungs.    [provider]  polyethylene glycol powder (GLYCOLAX/MIRALAX) powder Take 17 g by mouth daily as needed for mild constipation.  10/27/15   [provider]  predniSONE (STERAPRED UNI-PAK 21 TAB) 10 MG (21) TBPK tablet Take 6 tablets daily for the first two days. Decrease by one tablet every two days until medications runs out. 12/13/18   Lannie Fields, PA-C  roflumilast (DALIRESP) 500 MCG TABS tablet Take 500 mcg by mouth every morning.     [provider]  rosuvastatin (CRESTOR) 10 MG tablet Take 10 mg by mouth every evening. 11/05/13   [provider]  SPIRIVA HANDIHALER 18  MCG inhalation capsule Place 18 mcg into inhaler and inhale daily. 03/27/17   [provider]  XARELTO 20 MG TABS tablet Take 20 mg by mouth daily. 03/01/16   [provider]    Allergies Celebrex [celecoxib] and Dilaudid [hydromorphone]  Family History  Problem Relation Age of Onset  . Lung cancer Mother   . Ovarian cancer Paternal Grandmother   . Breast cancer Neg Hx     Social History Social History   Tobacco Use  . Smoking status: Current Every Day Smoker    Packs/day: 0.50    Years: 30.00    Pack years: 15.00    Types: Cigarettes  . Smokeless tobacco: Never Used  Vaping Use  . Vaping Use: Never used  Substance Use Topics  . Alcohol use: No  . Drug use: Not Currently    Types: Oxycodone    Comment: as prescribed by MD  Review of Systems  Constitutional: No fever/chills Eyes: No visual changes. ENT: No sore throat. Cardiovascular: Denies chest pain. Respiratory: Positive for shortness of breath. Gastrointestinal: No abdominal pain.  No nausea, no vomiting.  No diarrhea.  No constipation. Genitourinary: Negative for dysuria. Musculoskeletal: Positive for back pain. Skin: Negative for rash. Neurological: Negative for headaches, focal weakness or numbness.  ____________________________________________   PHYSICAL EXAM:  VITAL SIGNS: ED Triage Vitals  Enc Vitals Group     BP 06/03/20 1940 (!) 147/66     Pulse Rate 06/03/20 1940 (!) 115     Resp 06/03/20 1940 (!) 22     Temp 06/03/20 1940 99.2 F (37.3 C)     Temp src --      SpO2 06/03/20 1940 92 %     Weight 06/03/20 1941 278 lb (126.1 kg)     Height 06/03/20 1941 5\' 2"  (1.575 m)     Head Circumference --      Peak Flow --      Pain Score 06/03/20 1941 9     Pain Loc --      Pain Edu? --      Excl. in [place]? --     Constitutional: Alert and oriented. Eyes: Conjunctivae are normal. Head: Atraumatic. Nose: No congestion/rhinnorhea. Mouth/Throat: Mucous membranes are moist. Neck:  Normal ROM Cardiovascular: Tachycardic, regular rhythm. Grossly normal heart sounds.  2+ radial pulses bilaterally. Respiratory: Normal respiratory effort.  No retractions.  Lungs with poor air movement and wheezing throughout. Gastrointestinal: Soft and nontender. No distention. Genitourinary: deferred Musculoskeletal: No lower extremity tenderness nor edema. Neurologic:  Normal speech and language. No gross focal neurologic deficits are appreciated. Skin:  Skin is warm, dry and intact. No rash noted. Psychiatric: Mood and affect are normal. Speech and behavior are normal.  ____________________________________________   LABS (all labs ordered are listed, but only abnormal results are displayed)  Labs Reviewed  BASIC METABOLIC PANEL - Abnormal; Notable for the following components:      Result Value   Chloride 94 (*)    Glucose, Bld 153 (*)    All other components within normal limits  CBC - Abnormal; Notable for the following components:   RBC 5.23 (*)    Hemoglobin 15.4 (*)    HCT 49.2 (*)    All other components within normal limits  SARS CORONAVIRUS 2 (TAT 6-24 HRS)  D-DIMER, QUANTITATIVE  TROPONIN I (HIGH SENSITIVITY)  TROPONIN I (HIGH SENSITIVITY)   ____________________________________________  EKG  ED ECG REPORT I, Blake Divine, the attending physician, personally viewed and interpreted this ECG.   Date: 06/03/2020  EKG Time: 19:42  Rate: 118  Rhythm: sinus tachycardia  Axis: Normal  Intervals:none  ST&T Change: None   PROCEDURES  Procedure(s) performed (including Critical Care):  Procedures   ____________________________________________   INITIAL IMPRESSION / ASSESSMENT AND PLAN / ED COURSE       60 year old female with past medical history of hypertension, hyperlipidemia, diabetes, and COPD who presents to the ED complaining of increasing shortness of breath over the past couple of days, more severe today.  Patient is not in any respiratory  distress, had been placed on 4 L nasal cannula and states that she wears oxygen as needed at home.  We will wean down to 2 L and treat with duo nebs as well as steroids for apparent COPD exacerbation.  Given her tachycardia and new onset upper back pain, would also consider PE and we will check D-dimer.  EKG shows  sinus tachycardia with no ischemic changes, troponin is negative and I have a low suspicion for ACS.  D-dimer is within normal limits, doubt PE as patient is low risk by [place].  Chest x-ray reviewed by me and shows no infiltrate, edema, or effusion.  Patient reports feeling better following duo nebs and steroids, but she continues to be tachycardic and tachypneic with wheezing on exam.  She would benefit from admission for further treatment of COPD and case was discussed with hospitalist.      ____________________________________________   FINAL CLINICAL IMPRESSION(S) / ED DIAGNOSES  Final diagnoses:  COPD exacerbation [facility])     ED Discharge Orders    None       Note:  This document was prepared using Dragon voice recognition software and may include unintentional dictation errors.   Blake Divine, MD 06/03/20 [id number]

## 2020-06-03 NOTE — H&P (Signed)
History and Physical   GREYSON Leon GGY:[id number] DOB: 1960-05-30 DOA: 06/03/2020  Referring MD/NP/PA: Dr. Charna Archer  PCP: Danelle Berry, NP   Outpatient Specialists: None  Patient coming from: Home  Chief Complaint: Shortness of breath  HPI: Patty Leon is a 60 y.o. female with medical history significant of COPD on home O2 that she uses as needed, hypertension, hyperlipidemia, diabetes, morbid obesity, obstructive sleep apnea, history of panic attacks and GERD who presents to the ER with progressive shortness of breath for the last week.  Symptoms got worse in the last 3 days.  Patient uses oxygen as needed but has been using 3 L at home for the last few days.  Associated with some cough mildly productive.  Denies any fever or chills no nausea vomiting or diarrhea.  She was seen in the ER was tachypneic.  Initial oxygen sats was 90% on 3 L.  Patient received treatment in the ER with some minimal relief but still continued to be short of breath and wheezing.  She is therefore being admitted to the hospital for inpatient care..  ED Course: Temperature 99.2 blood pressure 147/66.  Her pulse is 115 respirate 29 oxygen sat 91% on 3 L.  White count is 8.5 hemoglobin 15.4 and platelet count of 260.  Chemistry largely within normal except glucose 153 and chloride 94.  Chest x-ray showed no acute findings.  Troponins are negative.  D-dimer 0.47.  Patient therefore being admitted with acute on chronic respiratory failure secondary to COPD exacerbation.  Review of Systems: As per HPI otherwise 10 point review of systems negative.    Past Medical History:  Diagnosis Date   Anxiety    Arthritis    COPD (chronic obstructive pulmonary disease) ([place])    Diabetes mellitus without complication ([place])    Diverticulosis    Fluid retention    GERD (gastroesophageal reflux disease)    History of blood clots    Hypercholesteremia    Hypertension    Obesity    Panic attacks     Shortness of breath dyspnea    Sleep apnea    Use C-PAP with oxygen    Past Surgical History:  Procedure Laterality Date   BILATERAL CARPAL TUNNEL RELEASE     COLONOSCOPY     COLONOSCOPY N/A 01/22/2015   Procedure: COLONOSCOPY;  Surgeon: Josefine Class, MD;  Location: [facility] ENDOSCOPY;  Service: Endoscopy;  Laterality: N/A;   COLONOSCOPY WITH PROPOFOL N/A 07/12/2016   Procedure: COLONOSCOPY WITH PROPOFOL;  Surgeon: Manya Silvas, MD;  Location: [facility] ENDOSCOPY;  Service: Endoscopy;  Laterality: N/A;   COLONOSCOPY WITH PROPOFOL N/A 04/26/2018   Procedure: COLONOSCOPY WITH PROPOFOL;  Surgeon: Manya Silvas, MD;  Location: [facility] ENDOSCOPY;  Service: Endoscopy;  Laterality: N/A;   ESOPHAGOGASTRODUODENOSCOPY (EGD) WITH PROPOFOL N/A 04/26/2018   Procedure: ESOPHAGOGASTRODUODENOSCOPY (EGD) WITH PROPOFOL;  Surgeon: Manya Silvas, MD;  Location: [facility] ENDOSCOPY;  Service: Endoscopy;  Laterality: N/A;   JOINT REPLACEMENT Left 09/2015   KNEE ARTHROSCOPY Left    KNEE CLOSED REDUCTION Left 11/04/2015   Procedure: CLOSED MANIPULATION KNEE;  Surgeon: Hessie Knows, MD;  Location: [facility] ORS;  Service: Orthopedics;  Laterality: Left;   KNEE CLOSED REDUCTION Left 04/04/2016   Procedure: CLOSED MANIPULATION KNEE;  Surgeon: Hessie Knows, MD;  Location: [facility] ORS;  Service: Orthopedics;  Laterality: Left;   SHOULDER ARTHROSCOPY W/ ROTATOR CUFF REPAIR Right    TOTAL KNEE ARTHROPLASTY Left 10/07/2015   Procedure: TOTAL KNEE ARTHROPLASTY;  Surgeon: Legrand Como  Rudene Christians, MD;  Location: [facility] ORS;  Service: Orthopedics;  Laterality: Left;   TOTAL KNEE REVISION Left 02/08/2016   Procedure: TOTAL KNEE REVISION;  Surgeon: Hessie Knows, MD;  Location: [facility] ORS;  Service: Orthopedics;  Laterality: Left;   TUBAL LIGATION       reports that she has been smoking cigarettes. She has a 15.00 pack-year smoking history. She has never used smokeless tobacco. She reports previous drug use. Drug: Oxycodone. She reports  that she does not drink alcohol.  Allergies  Allergen Reactions   Celebrex [Celecoxib] Swelling   Dilaudid [Hydromorphone] Itching    Family History  Problem Relation Age of Onset   Lung cancer Mother    Ovarian cancer Paternal Grandmother    Breast cancer Neg Hx      Prior to Admission medications   Medication Sig Start Date End Date Taking? Authorizing Provider  albuterol (PROAIR HFA) 108 (90 Base) MCG/ACT inhaler Inhale 2 puffs into the lungs every 6 (six) hours as needed for wheezing.    [provider]  amLODipine (NORVASC) 5 MG tablet Take 5 mg by mouth every morning.     [provider]  BYDUREON BCISE 2 MG/0.85ML AUIJ Inject 2 mg into the skin every Sunday. 04/09/17   [provider]  Cholecalciferol (VITAMIN D) 2000 units CAPS Take 2,000 Units by mouth daily.    [provider]  docusate sodium (COLACE) 100 MG capsule Take 100 mg by mouth daily. 08/12/15   [provider]  Fluticasone-Salmeterol (ADVAIR DISKUS) 250-50 MCG/DOSE AEPB Inhale 1 puff into the lungs 2 (two) times daily.    [provider]  GRALISE 600 MG TABS Take 1,800 mg by mouth daily as needed (for pain.).  11/25/15   [provider]  hydrochlorothiazide (HYDRODIURIL) 25 MG tablet Take 25 mg by mouth daily. 09/13/15   [provider]  KLOR-CON 10 10 MEQ tablet Take 10 mEq by mouth every Monday, Wednesday, and Friday. 02/24/17   [provider]  metFORMIN (GLUCOPHAGE) 500 MG tablet Take 500 mg by mouth 2 (two) times daily. 07/26/15   [provider]  metoprolol (LOPRESSOR) 50 MG tablet Take 50 mg by mouth every morning.  08/29/15   [provider]  omeprazole (PRILOSEC) 40 MG capsule Take 40 mg by mouth every morning.  07/27/15   [provider]  Oxycodone HCl 20 MG TABS Take 15 mg by mouth every 6 (six) hours as needed.  02/29/16   [provider]  OXYGEN Inhale into the lungs.    [provider]  polyethylene glycol powder (GLYCOLAX/MIRALAX) powder Take 17 g by mouth daily as needed for mild constipation.  10/27/15   [provider]  predniSONE (STERAPRED UNI-PAK 21 TAB) 10 MG (21) TBPK tablet Take 6 tablets daily for the first two days. Decrease by one tablet every two days until medications runs out. 12/13/18   Lannie Fields, PA-C  roflumilast (DALIRESP) 500 MCG TABS tablet Take 500 mcg by mouth every morning.     [provider]  rosuvastatin (CRESTOR) 10 MG tablet Take 10 mg by mouth every evening. 11/05/13   [provider]  SPIRIVA HANDIHALER 18 MCG inhalation capsule Place 18 mcg into inhaler and inhale daily. 03/27/17   [provider]  XARELTO 20 MG TABS tablet Take 20 mg by mouth daily. 03/01/16   [provider]    Physical Exam: Vitals:   06/03/20 1941 06/03/20 2030 06/03/20 2130 06/03/20 2200  BP:  Marland Kitchen)  127/55 122/73 114/70  Pulse:  (!) 112 (!) 110 (!) 113  Resp:  (!) 28 (!) 30 (!) 30  Temp:      SpO2:  95% 91% 94%  Weight: 126.1 kg     Height: 5\' 2"  (1.575 m)         Constitutional: Anxious, mild distress, obese Vitals:   06/03/20 1941 06/03/20 2030 06/03/20 2130 06/03/20 2200  BP:  (!) 127/55 122/73 114/70  Pulse:  (!) 112 (!) 110 (!) 113  Resp:  (!) 28 (!) 30 (!) 30  Temp:      SpO2:  95% 91% 94%  Weight: 126.1 kg     Height: 5\' 2"  (1.575 m)      Eyes: PERRL, lids and conjunctivae normal ENMT: Mucous membranes are moist. Posterior pharynx clear of any exudate or lesions.Normal dentition.  Neck: normal, supple, no masses, no thyromegaly Respiratory: Decreased air entry bilaterally with marked expiratory wheezing, no crackles, increased respiratory effort. No accessory muscle use.  Cardiovascular: Sinus tachycardia, no murmurs / rubs / gallops. No extremity edema. 2+ pedal pulses. No carotid bruits.  Abdomen: no tenderness, no masses palpated. No hepatosplenomegaly. Bowel sounds positive.   Musculoskeletal: no clubbing / cyanosis. No joint deformity upper and lower extremities. Good ROM, no contractures. Normal muscle tone.  Skin: no rashes, lesions, ulcers. No induration Neurologic: CN 2-12 grossly intact. Sensation intact, DTR normal. Strength 5/5 in all 4.  Psychiatric: Normal judgment and insight. Alert and oriented x 3.  Anxious mood.     Labs on Admission: I have personally reviewed following labs and imaging studies  CBC: Recent Labs  Lab 06/03/20 1944  WBC 8.5  HGB 15.4*  HCT 49.2*  MCV 94.1  PLT 540   Basic Metabolic Panel: Recent Labs  Lab 06/03/20 1944  NA 138  K 4.1  CL 94*  CO2 32  GLUCOSE 153*  BUN 10  CREATININE 0.67  CALCIUM 9.1   GFR: Estimated Creatinine Clearance: 97.4 mL/min (by C-G formula based on SCr of 0.67 mg/dL). Liver Function Tests: No results for input(s): AST, ALT, ALKPHOS, BILITOT, PROT, ALBUMIN in the last 168 hours. No results for input(s): LIPASE, AMYLASE in the last 168 hours. No results for input(s): AMMONIA in the last 168 hours. Coagulation Profile: No results for input(s): INR, PROTIME in the last 168 hours. Cardiac Enzymes: No results for input(s): CKTOTAL, CKMB, CKMBINDEX, TROPONINI in the last 168 hours. BNP (last 3 results) No results for input(s): PROBNP in the last 8760 hours. HbA1C: No results for input(s): HGBA1C in the last 72 hours. CBG: No results for input(s): GLUCAP in the last 168 hours. Lipid Profile: No results for input(s): CHOL, HDL, LDLCALC, TRIG, CHOLHDL, LDLDIRECT in the last 72 hours. Thyroid Function Tests: No results for input(s): TSH, T4TOTAL, FREET4, T3FREE, THYROIDAB in the last 72 hours. Anemia Panel: No results for input(s): VITAMINB12, FOLATE, FERRITIN, TIBC, IRON, RETICCTPCT in the last 72 hours. Urine analysis:    Component Value Date/Time   COLORURINE YELLOW (A) 04/25/2017 0651   APPEARANCEUR CLEAR (A) 04/25/2017 0651   APPEARANCEUR Clear 09/03/2012 0739   LABSPEC 1.011  04/25/2017 0651   LABSPEC 1.005 09/03/2012 0739   PHURINE 6.0 04/25/2017 0651   GLUCOSEU NEGATIVE 04/25/2017 0651   GLUCOSEU Negative 09/03/2012 0739   HGBUR MODERATE (A) 04/25/2017 0651   BILIRUBINUR NEGATIVE 04/25/2017 0651   BILIRUBINUR Negative 09/03/2012 [place] 04/25/2017 [place] NEGATIVE 04/25/2017 0651   NITRITE NEGATIVE 04/25/2017 0867  LEUKOCYTESUR NEGATIVE 04/25/2017 0651   LEUKOCYTESUR Negative 09/03/2012 0739   Sepsis Labs: @LABRCNTIP (procalcitonin:4,lacticidven:4) )No results found for this or any previous visit (from the past 240 hour(s)).   Radiological Exams on Admission: DG Chest 2 View  Result Date: 06/03/2020 CLINICAL DATA:  Short of breath for 1 day, COPD, increased oxygen demand EXAM: CHEST - 2 VIEW COMPARISON:  04/23/2017 FINDINGS: Frontal and lateral views of the chest demonstrate a stable cardiac silhouette. Lungs are hyperinflated, with chronic interstitial prominence consistent with emphysema. There is mild chronic central vascular congestion without airspace disease, effusion, or pneumothorax. IMPRESSION: 1. Emphysema. 2. Chronic central vascular congestion without airspace disease. Electronically Signed   By: Randa Ngo M.D.   On: 06/03/2020 20:03    EKG: Independently reviewed.  Shows sinus tachycardia with a rate of 118, low voltage EKG.  Assessment/Plan Principal Problem:   COPD with acute exacerbation (HCC) Active Problems:   SIRS (systemic inflammatory response syndrome) (HCC)   Diabetes (HCC)   GERD (gastroesophageal reflux disease)   Anxiety disorder   Acute on chronic respiratory failure with hypoxia (HCC)     #1 acute on chronic respiratory failure with hypoxia: Secondary to COPD exacerbation.  Patient will be admitted.  She is having increased oxygen requirement currently up to 4 L.  She normally will use it as needed.  We will therefore check her Covid status.  Continue with oxygen as well as treatment for  her COPD exacerbation.  #2 acute exacerbation of COPD: Initiate IV Solu-Medrol, nebulizer treatment, antibiotics and continue oxygen.  Titrate steroid to oral once patient is improved.  #3 diabetes: Sliding scale insulin.  #4 morbid obesity: Continue dietary counseling.  #5 SIRS: Most likely secondary to COPD exacerbation.  Continue treatment as above.  #6 anxiety disorder with panic attacks: Confirm on resume home regimen.  #7 GERD: Confirm and resume home regimen of PPIs   DVT prophylaxis: Lovenox Code Status: Full code Family Communication: No family at bedside Disposition Plan: Home Consults called: None Admission status: Inpatient  Severity of Illness: The appropriate patient status for this patient is INPATIENT. Inpatient status is judged to be reasonable and necessary in order to provide the required intensity of service to ensure the patient's safety. The patient's presenting symptoms, physical exam findings, and initial radiographic and laboratory data in the context of their chronic comorbidities is felt to place them at high risk for further clinical deterioration. Furthermore, it is not anticipated that the patient will be medically stable for discharge from the hospital within 2 midnights of admission. The following factors support the patient status of inpatient.   " The patient's presenting symptoms include shortness of breath. " The worrisome physical exam findings include mild expiratory wheezing. " The initial radiographic and laboratory data are worrisome because of normal chest x-ray. " The chronic co-morbidities include COPD.   * I certify that at the point of admission it is my clinical judgment that the patient will require inpatient hospital care spanning beyond 2 midnights from the point of admission due to high intensity of service, high risk for further deterioration and high frequency of surveillance required.Barbette Merino MD [facility] Pager [phone]  If 7PM-7AM, please contact night-coverage [email or web address] Password [facility]  06/03/2020, 11:15 PM

## 2020-06-03 NOTE — ED Notes (Signed)
EKG signed by provider

## 2020-06-04 ENCOUNTER — Encounter: Payer: Self-pay | Admitting: Internal Medicine

## 2020-06-04 LAB — CBC
HCT: 49.1 % — ABNORMAL HIGH (ref 36.0–46.0)
Hemoglobin: 15.3 g/dL — ABNORMAL HIGH (ref 12.0–15.0)
MCH: 29.7 pg (ref 26.0–34.0)
MCHC: 31.2 g/dL (ref 30.0–36.0)
MCV: 95.2 fL (ref 80.0–100.0)
Platelets: 241 10*3/uL (ref 150–400)
RBC: 5.16 MIL/uL — ABNORMAL HIGH (ref 3.87–5.11)
RDW: 13.6 % (ref 11.5–15.5)
WBC: 7.8 10*3/uL (ref 4.0–10.5)
nRBC: 0.4 % — ABNORMAL HIGH (ref 0.0–0.2)

## 2020-06-04 LAB — SARS CORONAVIRUS 2 (TAT 6-24 HRS): SARS Coronavirus 2: NEGATIVE

## 2020-06-04 LAB — BASIC METABOLIC PANEL
Anion gap: 9 (ref 5–15)
BUN: 11 mg/dL (ref 6–20)
CO2: 35 mmol/L — ABNORMAL HIGH (ref 22–32)
Calcium: 9.6 mg/dL (ref 8.9–10.3)
Chloride: 94 mmol/L — ABNORMAL LOW (ref 98–111)
Creatinine, Ser: 0.63 mg/dL (ref 0.44–1.00)
GFR, Estimated: >60 mL/min (ref >60)
Glucose, Bld: 195 mg/dL — ABNORMAL HIGH (ref 70–99)
Potassium: 3.9 mmol/L (ref 3.5–5.1)
Sodium: 138 mmol/L (ref 135–145)

## 2020-06-04 LAB — GLUCOSE, CAPILLARY
Glucose-Capillary: 163 mg/dL — ABNORMAL HIGH (ref 70–99)
Glucose-Capillary: 225 mg/dL — ABNORMAL HIGH (ref 70–99)
Glucose-Capillary: 250 mg/dL — ABNORMAL HIGH (ref 70–99)
Glucose-Capillary: 260 mg/dL — ABNORMAL HIGH (ref 70–99)
Glucose-Capillary: 281 mg/dL — ABNORMAL HIGH (ref 70–99)

## 2020-06-04 LAB — HIV ANTIBODY (ROUTINE TESTING W REFLEX): HIV Screen 4th Generation wRfx: NONREACTIVE

## 2020-06-04 MED ORDER — METHOCARBAMOL 500 MG PO TABS
500.0000 mg | ORAL_TABLET | Freq: Three times a day (TID) | ORAL | Status: DC | PRN
  Administered 2020-06-05 (×2): 500 mg via ORAL
  Filled 2020-06-04 (×2): qty 1

## 2020-06-04 MED ORDER — INSULIN GLARGINE 100 UNIT/ML ~~LOC~~ SOLN
10.0000 [IU] | Freq: Every day | SUBCUTANEOUS | Status: DC
  Filled 2020-06-04 (×2): qty 0.1

## 2020-06-04 MED ORDER — AZITHROMYCIN 500 MG PO TABS
500.0000 mg | ORAL_TABLET | Freq: Every day | ORAL | Status: DC
  Administered 2020-06-04 – 2020-06-05 (×2): 500 mg via ORAL
  Filled 2020-06-04 (×2): qty 1

## 2020-06-04 MED ORDER — METHYLPREDNISOLONE SODIUM SUCC 40 MG IJ SOLR
40.0000 mg | Freq: Three times a day (TID) | INTRAMUSCULAR | Status: DC
  Administered 2020-06-04 – 2020-06-05 (×3): 40 mg via INTRAVENOUS
  Filled 2020-06-04 (×3): qty 1

## 2020-06-04 MED ORDER — ACETAMINOPHEN 325 MG PO TABS
650.0000 mg | ORAL_TABLET | Freq: Four times a day (QID) | ORAL | Status: DC | PRN
  Administered 2020-06-04 – 2020-06-05 (×3): 650 mg via ORAL
  Filled 2020-06-04 (×3): qty 2

## 2020-06-04 MED ORDER — ARFORMOTEROL TARTRATE 15 MCG/2ML IN NEBU
15.0000 ug | INHALATION_SOLUTION | Freq: Two times a day (BID) | RESPIRATORY_TRACT | Status: DC
  Administered 2020-06-04 – 2020-06-05 (×3): 15 ug via RESPIRATORY_TRACT
  Filled 2020-06-04 (×4): qty 2

## 2020-06-04 MED ORDER — ROFLUMILAST 500 MCG PO TABS
500.0000 ug | ORAL_TABLET | Freq: Every day | ORAL | Status: DC
  Administered 2020-06-04 – 2020-06-05 (×2): 500 ug via ORAL
  Filled 2020-06-04 (×2): qty 1

## 2020-06-04 MED ORDER — PANTOPRAZOLE SODIUM 40 MG PO TBEC
40.0000 mg | DELAYED_RELEASE_TABLET | Freq: Every day | ORAL | Status: DC
  Administered 2020-06-04 – 2020-06-05 (×2): 40 mg via ORAL
  Filled 2020-06-04 (×2): qty 1

## 2020-06-04 MED ORDER — AMLODIPINE BESYLATE 5 MG PO TABS
5.0000 mg | ORAL_TABLET | ORAL | Status: DC
Start: 2020-06-04 — End: 2020-06-05
  Administered 2020-06-04 – 2020-06-05 (×2): 5 mg via ORAL
  Filled 2020-06-04 (×2): qty 1

## 2020-06-04 MED ORDER — RIVAROXABAN 20 MG PO TABS
20.0000 mg | ORAL_TABLET | Freq: Every day | ORAL | Status: DC
  Administered 2020-06-04: 20 mg via ORAL
  Filled 2020-06-04 (×2): qty 1

## 2020-06-04 MED ORDER — DOCUSATE SODIUM 100 MG PO CAPS
100.0000 mg | ORAL_CAPSULE | Freq: Every day | ORAL | Status: DC
Start: 2020-06-04 — End: 2020-06-05
  Administered 2020-06-04 – 2020-06-05 (×2): 100 mg via ORAL
  Filled 2020-06-04 (×2): qty 1

## 2020-06-04 MED ORDER — BUDESONIDE 0.25 MG/2ML IN SUSP
0.2500 mg | Freq: Two times a day (BID) | RESPIRATORY_TRACT | Status: DC
  Administered 2020-06-04 – 2020-06-05 (×3): 0.25 mg via RESPIRATORY_TRACT
  Filled 2020-06-04 (×3): qty 2

## 2020-06-04 MED ORDER — LOSARTAN POTASSIUM 25 MG PO TABS: 25.0000 mg | ORAL_TABLET | Freq: Every day | ORAL | Status: DC

## 2020-06-04 MED ORDER — ENOXAPARIN SODIUM 80 MG/0.8ML ~~LOC~~ SOLN
0.5000 mg/kg | SUBCUTANEOUS | Status: DC
  Filled 2020-06-04: qty 0.8

## 2020-06-04 MED ORDER — METHYLPREDNISOLONE SODIUM SUCC 125 MG IJ SOLR
60.0000 mg | Freq: Four times a day (QID) | INTRAMUSCULAR | Status: DC
  Administered 2020-06-04: 60 mg via INTRAVENOUS
  Filled 2020-06-04: qty 2

## 2020-06-04 MED ORDER — OXYCODONE HCL 5 MG PO TABS
5.0000 mg | ORAL_TABLET | Freq: Three times a day (TID) | ORAL | Status: DC | PRN
  Administered 2020-06-04 – 2020-06-05 (×3): 5 mg via ORAL
  Filled 2020-06-04 (×4): qty 1

## 2020-06-04 MED ORDER — METOPROLOL TARTRATE 50 MG PO TABS
50.0000 mg | ORAL_TABLET | ORAL | Status: DC
  Administered 2020-06-04 – 2020-06-05 (×2): 50 mg via ORAL
  Filled 2020-06-04 (×2): qty 1

## 2020-06-04 MED ORDER — PREDNISONE 20 MG PO TABS: 40.0000 mg | ORAL_TABLET | Freq: Every day | ORAL | Status: DC

## 2020-06-04 NOTE — Progress Notes (Signed)
PHARMACIST - PHYSICIAN COMMUNICATION  CONCERNING:  Enoxaparin (Lovenox) for DVT Prophylaxis    RECOMMENDATION: Patient was prescribed enoxaprin 40mg  q24 hours for VTE prophylaxis.   Filed Weights   06/03/20 1941  Weight: 126.1 kg (278 lb)    Body mass index is 50.85 kg/m.  Estimated Creatinine Clearance: 97.4 mL/min (by C-G formula based on SCr of 0.67 mg/dL).   Based on [place] patient is candidate for enoxaparin 0.5mg /kg TBW SQ every 24 hours based on BMI being >30.   DESCRIPTION: Pharmacy has adjusted enoxaparin dose per [facility] policy.  Patient is now receiving enoxaparin 0.5 mg/kg every 24 hours   Renda Rolls, PharmD, [facility] 06/04/2020 12:03 AM

## 2020-06-04 NOTE — Evaluation (Signed)
Physical Therapy Evaluation Patient Details Name: Patty Leon MRN: [id number] DOB: 02-Apr-1960 Today's Date: 06/04/2020   History of Present Illness  Pt is a 60 y.o. female with medical history significant of COPD on home O2 that she uses as needed, hypertension, hyperlipidemia, diabetes, morbid obesity, obstructive sleep apnea, history of panic attacks and GERD who presents to the ER with progressive shortness of breath for the last week.    Clinical Impression  Patient easily woken at start of session, cpap in place. Pt reported that she is independent at baseline, SPC PRN as needed for ambulation, has an aide that comes daily to assist as needed. No falls in the last 12 months, home with ramped entrance, pt drives.  The patient demonstrated bed mobility modI, good sitting balance noted. Sit <> stand modI without AD. Ambulated ~1109ft with noAD, CGA. Occasional gait path deviations/staggering noted but no true LOB. Pt cued for activity tolerance and activity pacing. Returned to room, up in recliner at end of session. The patient demonstrated mild deficits in strength, endurance, activity tolerance and would benefit from PT to maximize strength, function, and safety. Recommendation is HHPT with intermittent supervision.  Of note, pt on 3L, but with supine to sit and after ambulation, dropped to high 70s. Turned up to 4L and cued for PLB, able to recover and return to 3L at end of session. RN notified of pt status.    Follow Up Recommendations Home health PT;Supervision - Intermittent    Equipment Recommendations       Recommendations for Other Services       Precautions / Restrictions Precautions Precautions: Fall Restrictions Weight Bearing Restrictions: No      Mobility  Bed Mobility Overal bed mobility: Modified Independent                  Transfers Overall transfer level: Modified independent Equipment used: None                 Ambulation/Gait Ambulation/Gait assistance: Min guard Gait Distance (Feet): 110 Feet         General Gait Details: Pt did have occasional gait path deviations/staggering, but no true LOB noted. Cued for activity pacing as needed to maximize pt safety. desaturated to high 70s after ambulation, placed on 4L to recover, able to return to [employer]            Wheelchair Mobility    Modified Rankin (Stroke Patients Only)       Balance Overall balance assessment: Needs assistance Sitting-balance support: Feet supported Sitting balance-Leahy Scale: Normal       Standing balance-Leahy Scale: Good                               Pertinent Vitals/Pain Pain Assessment: No/denies pain    Home Living Family/patient expects to be discharged to:: Private residence Living Arrangements: Alone Available Help at Discharge: Family;Available PRN/intermittently;Other (Comment) ("I dont think I need 24/7 assistance" per pt) Type of Home: Mobile home Home Access: Ramped entrance     Home Layout: One level Home Equipment: Walker - 2 wheels;Cane - single point Additional Comments: denies any falls in the last 6 months. Has an aide that comes daily to assist with bathing, meals, cleaning, laundry    Prior Function Level of Independence: Independent         Comments: PRN use of SPC, aide assists with ADLs as needed, drives. wears  her CPAP machine at home as needed/especially with sleep     Hand Dominance   Dominant Hand: Right    Extremity/Trunk Assessment   Upper Extremity Assessment Upper Extremity Assessment: Overall WFL for tasks assessed    Lower Extremity Assessment Lower Extremity Assessment: Overall WFL for tasks assessed    Cervical / Trunk Assessment Cervical / Trunk Assessment: Normal  Communication   Communication: No difficulties  Cognition Arousal/Alertness: Awake/alert Behavior During Therapy: WFL for tasks assessed/performed Overall  Cognitive Status: Within Functional Limits for tasks assessed                                        General Comments      Exercises Other Exercises Other Exercises: Pt educated on activity pacing and potential need for O2 increase to 4L for mobility, verbalized understanding   Assessment/Plan    PT Assessment Patient needs continued PT services  PT Problem List Decreased strength;Decreased range of motion;Decreased activity tolerance;Decreased balance;Decreased knowledge of precautions;Decreased mobility       PT Treatment Interventions DME instruction;Therapeutic exercise;Gait training;Balance training;Stair training;Neuromuscular re-education;Functional mobility training;Therapeutic activities;Patient/family education    PT Goals (Current goals can be found in the Care Plan section)  Acute Rehab PT Goals Patient Stated Goal: to go home PT Goal Formulation: With patient Time For Goal Achievement: 06/18/20 Potential to Achieve Goals: Good    Frequency Min 2X/week   Barriers to discharge        Co-evaluation PT/OT/SLP Co-Evaluation/Treatment: Yes Reason for Co-Treatment: To address functional/ADL transfers;For patient/therapist safety PT goals addressed during session: Mobility/safety with mobility;Balance OT goals addressed during session: ADL's and self-care;Proper use of Adaptive equipment and DME       AM-PAC PT "6 Clicks" Mobility  Outcome Measure Help needed turning from your back to your side while in a flat bed without using bedrails?: A Little Help needed moving from lying on your back to sitting on the side of a flat bed without using bedrails?: A Little Help needed moving to and from a bed to a chair (including a wheelchair)?: A Little Help needed standing up from a chair using your arms (e.g., wheelchair or bedside chair)?: A Little Help needed to walk in hospital room?: A Little Help needed climbing 3-5 steps with a railing? : A Little 6  Click Score: 18    End of Session Equipment Utilized During Treatment: Gait belt;Oxygen (3-4L) Activity Tolerance: Patient tolerated treatment well Patient left: in chair;with chair alarm set;with call bell/phone within reach Nurse Communication: Mobility status PT Visit Diagnosis: Other abnormalities of gait and mobility (R26.89);Difficulty in walking, not elsewhere classified (R26.2);Muscle weakness (generalized) (M62.81)    Time: 4765-4650 PT Time Calculation (min) (ACUTE ONLY): 24 min   Charges:   PT Evaluation $PT Eval Low Complexity: 1 Low         Lieutenant Diego PT, DPT 2:56 PM,06/04/20

## 2020-06-04 NOTE — Progress Notes (Signed)
Nutrition Brief Note  RD consulted for assessment of nutritional requirements/ status.   Wt Readings from Last 15 Encounters:  06/04/20 126.1 kg  12/13/18 129.3 kg  04/26/18 126.6 kg  10/15/17 129.3 kg  04/23/17 124.7 kg  10/11/16 127 kg  07/12/16 120.2 kg  07/12/16 120.2 kg  04/04/16 120.2 kg  03/27/16 120.2 kg  02/08/16 123.3 kg  01/25/16 117.9 kg  11/04/15 120.2 kg  10/07/15 120.2 kg  09/22/15 120.2 kg   Patty Leon is a 60 y.o. female with medical history significant of COPD on home O2 that she uses as needed, hypertension, hyperlipidemia, diabetes, morbid obesity, obstructive sleep apnea, history of panic attacks and GERD who presents to the ER with progressive shortness of breath for the last week.  Pt admitted with COPD exacerbation,   Reviewed wt hx; wt has been stable over the past year.   Nutrition-Focused physical exam completed. Findings are no fat depletion, no muscle depletion, and mild edema.   Medications reviewed and include colace and IV solu-medrol.   Labs reviewed: CBGS: [id number] (inpatient orders for glycemic control are 0-20 units insulin aspart TID with meals, 0-5 units insulin aspart daily at bedtime, and 10 units insulin glarigne daily).   Body mass index is 50.85 kg/m. Patient meets criteria for extreme obesity, class III based on current BMI. Obesity is a complex, chronic medical condition that is optimally managed by a multidisciplinary care team. Weight loss is not an ideal goal for an acute inpatient hospitalization. However, if further work-up for obesity is warranted, consider outpatient referral to outpatient bariatric service and/or [facility].   Current diet order is carb modified, patient is consuming approximately 100% of meals at this time. Labs and medications reviewed.   No nutrition interventions warranted at this time. If nutrition issues arise, please consult RD.   Loistine Chance, RD, LDN,  [place] Registered Dietitian II Certified Diabetes Care and Education Specialist Please refer to [facility] for RD and/or RD on-call/weekend/after hours pager Pt admitted with COPD exacerbation.

## 2020-06-04 NOTE — Progress Notes (Signed)
PROGRESS NOTE    Patty Leon  LZJ:[id number] DOB: 24-Oct-1960 DOA: 06/03/2020 PCP: Danelle Berry, NP  Brief Narrative:  60 y.o. female with medical history significant of COPD on home O2 that she uses as needed, hypertension, hyperlipidemia, diabetes, morbid obesity, obstructive sleep apnea, history of panic attacks and GERD who presents to the ER with progressive shortness of breath for the last week.  Symptoms got worse in the last 3 days.  Patient uses oxygen as needed but has been using 3 L at home for the last few days.  Associated with some cough mildly productive.  Denies any fever or chills no nausea vomiting or diarrhea.  She was seen in the ER was tachypneic.  Initial oxygen sats was 90% on 3 L.  Patient received treatment in the ER with some minimal relief but still continued to be short of breath and wheezing.  She is therefore being admitted to the hospital for inpatient care  Received IV steroids and DuoNeb therapy with improvement in respiratory status.  Returned to baseline 3 L oxygen.  Still with very poor air movement and shortness of breath with minimal exertion.   Assessment & Plan:   Principal Problem:   COPD with acute exacerbation ([place]) Active Problems:   SIRS (systemic inflammatory response syndrome) (HCC)   Diabetes (HCC)   GERD (gastroesophageal reflux disease)   Anxiety disorder   Acute on chronic respiratory failure with hypoxia (HCC)  Acute exacerbation of COPD Acute on chronic hypoxic respiratory failure secondary to above Obstructive sleep apnea on home CPAP Worsening oxygen requirement associated with shortness of breath Require 4 L on admission, baseline 3 L Plan: Continue IV Solu-Medrol 40 mg every 8 hours x2 days Scheduled DuoNebs every 6 hours Brovana twice daily Pulmicort twice daily Daliresp per home dose Azithromycin 500 mg p.o. 3 daily x3 days Supplemental oxygen, wean as tolerated Okay to bring in home CPAP  Non-insulin-dependent  diabetes mellitus Hold Metformin on hold Start basal bolus regimen Plan: Lantus 10 units daily Sliding scale insulin Carb modified diet  Essential hypertension Continue home Norvasc 5 mg daily Continue home metoprolol 50 mg daily Hold home hydrochlorothiazide Hold home losartan Restart these medications as appropriate  Hyperlipidemia Continue statin  History of VTE Continue Xarelto  Morbid obesity Nutrition consult This complicates overall care and prognosis  Chronic pain Mid back pain Home oxycodone restarted As needed muscle relaxant APAP mild pain/headache  DVT prophylaxis: Xarelto Code Status: Full Family Communication: None today Disposition Plan: Status is: Inpatient  Remains inpatient appropriate because:Inpatient level of care appropriate due to severity of illness   Dispo: The patient is from: Home              Anticipated d/c is to: Home              Patient currently is not medically stable to d/c.   Difficult to place patient No  Acute decompensated COPD and acute on chronic hypoxic respiratory failure.  Anticipate medical readiness for discharge in 48 hours     Level of care: Med-Surg  Consultants:   None  Procedures:   None  Antimicrobials:   Azithromycin   Subjective: Patient seen and examined.  Reports improvement in respiratory status since admission.  Not quite at baseline.  Also endorses a mid and low back pain.  Objective: Vitals:   06/04/20 0054 06/04/20 0359 06/04/20 0720 06/04/20 0800  BP: (!) 115/58 (!) 122/57 116/62   Pulse: (!) 109 (!) 106 100  Resp: 19 19 18    Temp: 98.7 F (37.1 C) (!) 97.5 F (36.4 C) 98.6 F (37 C)   TempSrc:  Oral Oral   SpO2: 90% 92% 97% 94%  Weight:  126.1 kg    Height:  5\' 2"  (1.575 m)     No intake or output data in the 24 hours ending 06/04/20 0941 Filed Weights   06/03/20 1941 06/04/20 0359  Weight: 126.1 kg 126.1 kg    Examination:  General exam: Appears calm and  comfortable  Respiratory system: Lung sounds diffusely diminished bilaterally.  Scattered crackles.  Mild end expiratory wheeze.  Normal work of breathing.  3 L Cardiovascular system: S1 & S2 heard, RRR. No JVD, murmurs, rubs, gallops or clicks. No pedal edema. Gastrointestinal system: Obese, nontender, nondistended, normal bowel sounds Central nervous system: Alert and oriented. No focal neurological deficits. Extremities: Symmetric 5 x 5 power. Skin: No rashes, lesions or ulcers Psychiatry: Judgement and insight appear normal. Mood & affect appropriate.     Data Reviewed: I have personally reviewed following labs and imaging studies  CBC: Recent Labs  Lab 06/03/20 1944 06/04/20 0444  WBC 8.5 7.8  HGB 15.4* 15.3*  HCT 49.2* 49.1*  MCV 94.1 95.2  PLT 260 400   Basic Metabolic Panel: Recent Labs  Lab 06/03/20 1944 06/04/20 0444  NA 138 138  K 4.1 3.9  CL 94* 94*  CO2 32 35*  GLUCOSE 153* 195*  BUN 10 11  CREATININE 0.67 0.63  CALCIUM 9.1 9.6   GFR: Estimated Creatinine Clearance: 97.4 mL/min (by C-G formula based on SCr of 0.63 mg/dL). Liver Function Tests: No results for input(s): AST, ALT, ALKPHOS, BILITOT, PROT, ALBUMIN in the last 168 hours. No results for input(s): LIPASE, AMYLASE in the last 168 hours. No results for input(s): AMMONIA in the last 168 hours. Coagulation Profile: No results for input(s): INR, PROTIME in the last 168 hours. Cardiac Enzymes: No results for input(s): CKTOTAL, CKMB, CKMBINDEX, TROPONINI in the last 168 hours. BNP (last 3 results) No results for input(s): PROBNP in the last 8760 hours. HbA1C: No results for input(s): HGBA1C in the last 72 hours. CBG: Recent Labs  Lab 06/04/20 0048 06/04/20 0722  GLUCAP 281* 163*   Lipid Profile: No results for input(s): CHOL, HDL, LDLCALC, TRIG, CHOLHDL, LDLDIRECT in the last 72 hours. Thyroid Function Tests: No results for input(s): TSH, T4TOTAL, FREET4, T3FREE, THYROIDAB in the last 72  hours. Anemia Panel: No results for input(s): VITAMINB12, FOLATE, FERRITIN, TIBC, IRON, RETICCTPCT in the last 72 hours. Sepsis Labs: No results for input(s): PROCALCITON, LATICACIDVEN in the last 168 hours.  No results found for this or any previous visit (from the past 240 hour(s)).       Radiology Studies: DG Chest 2 View  Result Date: 06/03/2020 CLINICAL DATA:  Short of breath for 1 day, COPD, increased oxygen demand EXAM: CHEST - 2 VIEW COMPARISON:  04/23/2017 FINDINGS: Frontal and lateral views of the chest demonstrate a stable cardiac silhouette. Lungs are hyperinflated, with chronic interstitial prominence consistent with emphysema. There is mild chronic central vascular congestion without airspace disease, effusion, or pneumothorax. IMPRESSION: 1. Emphysema. 2. Chronic central vascular congestion without airspace disease. Electronically Signed   By: Randa Ngo M.D.   On: 06/03/2020 20:03        Scheduled Meds: . amLODipine  5 mg Oral BH-q7a  . arformoterol  15 mcg Nebulization BID  . azithromycin  500 mg Oral Daily  . budesonide (PULMICORT) nebulizer solution  0.25 mg Nebulization BID  . docusate sodium  100 mg Oral Daily  . insulin aspart  0-20 Units Subcutaneous TID WC  . insulin aspart  0-5 Units Subcutaneous QHS  . insulin glargine  10 Units Subcutaneous Daily  . ipratropium-albuterol  3 mL Nebulization Q6H  . methylPREDNISolone (SOLU-MEDROL) injection  40 mg Intravenous Q8H   Followed by  . [START ON 06/06/2020] predniSONE  40 mg Oral Q breakfast  . metoprolol tartrate  50 mg Oral BH-q7a  . pantoprazole  40 mg Oral Daily  . rivaroxaban  20 mg Oral Q supper  . roflumilast  500 mcg Oral Daily   Continuous Infusions:   LOS: 1 day    Time spent: 25 minutes    Sidney Ace, MD [facility] Pager [phone]  If 7PM-7AM, please contact night-coverage 06/04/2020, 9:41 AM

## 2020-06-04 NOTE — Evaluation (Signed)
Occupational Therapy Evaluation Patient Details Name: Patty Leon MRN: [id number] DOB: May 30, 1960 Today's Date: 06/04/2020    History of Present Illness Pt is a 60 y.o. female with medical history significant of COPD on home O2 that she uses as needed, hypertension, hyperlipidemia, diabetes, morbid obesity, obstructive sleep apnea, history of panic attacks and GERD who presents to the ER with progressive shortness of breath for the last week.   Clinical Impression   Pt seen for OT evaluation and co-tx w/ PT this date. Pt was generally in all ADL and using SPC PRN for functional mobility, living in a 1 story home with a ramped entrance. Pt reports aide comes daily to assist with sponge bathing, laundry, PRN meal prep, and housekeeping tasks. She manages her own medications, drives, and denies falls. Pt on 3 liters of O2 at home via her CPAP which she reports wearing when she sleeps at night and when she naps during the day. Pt endorses a couple days with headache, currently reporting 8/10 pain.  Pt reports becoming easily fatigued or out of breath with minimal exertion. Pt currently modified independent with ADL mobility, on 3L O2 but desats to mid to high 70's requiring 3+min seated rest break with increase to 4L to recover to >90-91% SpO2 before turned back down to 3L. RN notified. Pt educated in energy conservation strategies including pursed lip breathing, activity pacing, home/routines modifications, work simplification, AE/DME, prioritizing of meaningful occupations, and falls prevention. Handout provided. Pt verbalized understanding and would benefit from additional skilled OT services to maximize recall and carryover of learned techniques and facilitate implementation of learned techniques into daily routines. Upon discharge, recommend [place] services.      Follow Up Recommendations  Home health OT    Equipment Recommendations  None recommended by OT    Recommendations for Other Services        Precautions / Restrictions Precautions Precautions: Fall Precaution Comments: monitor O2 sat Restrictions Weight Bearing Restrictions: No      Mobility Bed Mobility Overal bed mobility: Modified Independent                  Transfers Overall transfer level: Modified independent Equipment used: None                  Balance Overall balance assessment: Needs assistance Sitting-balance support: Feet supported Sitting balance-Leahy Scale: Normal       Standing balance-Leahy Scale: Good                             ADL either performed or assessed with clinical judgement   ADL Overall ADL's : Modified independent;At baseline                                       General ADL Comments: PRN MIN A for LB ADL tasks, near baseline, however desats to mid to high 70's during ADL/mobility requiring cues for PLB and increased O2 3L to 4L requiring increased rest to recover     Vision Patient Visual Report: No change from baseline       Perception     Praxis      Pertinent Vitals/Pain Pain Assessment: 0-10 Pain Score: 8  Pain Location: headache Pain Descriptors / Indicators: Headache Pain Intervention(s): Limited activity within patient's tolerance;Monitored during session;Premedicated before session;Repositioned     Hand Dominance  Right   Extremity/Trunk Assessment Upper Extremity Assessment Upper Extremity Assessment: Overall WFL for tasks assessed   Lower Extremity Assessment Lower Extremity Assessment: Overall WFL for tasks assessed   Cervical / Trunk Assessment Cervical / Trunk Assessment: Normal   Communication Communication Communication: No difficulties   Cognition Arousal/Alertness: Awake/alert Behavior During Therapy: WFL for tasks assessed/performed Overall Cognitive Status: Within Functional Limits for tasks assessed                                     General Comments        Exercises Other Exercises Other Exercises: Pt educated on activity pacing and potential need for O2 increase to 4L for mobility, verbalized understanding Other Exercises: Pt instructed in energy conservation strategies including pursed lip breathing, rest breaks, activity pacing, work simplification, falls, and home/routines modifications wiht handout provided   Shoulder Instructions      Home Living Family/patient expects to be discharged to:: Private residence Living Arrangements: Alone Available Help at Discharge: Family;Available PRN/intermittently;Other (Comment) ("I dont think I need 24/7 assistance" per pt) Type of Home: Mobile home Home Access: Ramped entrance     Home Layout: One level     Bathroom Shower/Tub: Teacher, early years/pre: Handicapped height     Home Equipment: Environmental consultant - 2 wheels;Cane - single point   Additional Comments: denies any falls in the last 6 months. Has an aide that comes daily to assist with bathing, meals, cleaning, laundry      Prior Functioning/Environment Level of Independence: Independent        Comments: PRN use of SPC, aide assists with ADLs as needed, drives. wears her CPAP machine at home as needed/especially with sleep        OT Problem List: Decreased activity tolerance;Decreased knowledge of use of DME or AE      OT Treatment/Interventions: Self-care/ADL training;Therapeutic activities;DME and/or AE instruction;Energy conservation;Patient/family education    OT Goals(Current goals can be found in the care plan section) Acute Rehab OT Goals Patient Stated Goal: to go home OT Goal Formulation: With patient Time For Goal Achievement: 06/18/20 Potential to Achieve Goals: Good ADL Goals Pt Will Perform Lower Body Dressing: with modified independence;sit to/from stand (SpO2 >90% on 3-4L) Pt Will Transfer to Toilet: with modified independence;ambulating (LRAD for amb, SpO2 >89% on 3-4L) Additional ADL Goal #1: Pt will  verbalize plan to implement at least 2 learned ECS to support improved safety/indep with ADL and IADL at home.  OT Frequency: Min 1X/week   Barriers to D/C:            Co-evaluation PT/OT/SLP Co-Evaluation/Treatment: Yes Reason for Co-Treatment: For patient/therapist safety;To address functional/ADL transfers PT goals addressed during session: Mobility/safety with mobility;Balance OT goals addressed during session: ADL's and self-care;Proper use of Adaptive equipment and DME      AM-PAC OT "6 Clicks" Daily Activity     Outcome Measure Help from another person eating meals?: None Help from another person taking care of personal grooming?: None Help from another person toileting, which includes using toliet, bedpan, or urinal?: None Help from another person bathing (including washing, rinsing, drying)?: A Little Help from another person to put on and taking off regular upper body clothing?: None Help from another person to put on and taking off regular lower body clothing?: A Little 6 Click Score: 22   End of Session    Activity Tolerance: Patient tolerated  treatment well Patient left: in chair;with call bell/phone within reach;with chair alarm set  OT Visit Diagnosis: Other abnormalities of gait and mobility (R26.89)                Time: 9678-9381 OT Time Calculation (min): 24 min Charges:  OT General Charges $OT Visit: 1 Visit OT Evaluation $OT Eval Low Complexity: 1 Low OT Treatments $Self Care/Home Management : 8-22 mins  Hanley Hays, MPH, MS, OTR/L ascom [phone] 06/04/20, 3:21 PM

## 2020-06-05 LAB — HEMOGLOBIN A1C
Hgb A1c MFr Bld: 7 % — ABNORMAL HIGH (ref 4.8–5.6)
Mean Plasma Glucose: 154 mg/dL

## 2020-06-05 LAB — GLUCOSE, CAPILLARY
Glucose-Capillary: 226 mg/dL — ABNORMAL HIGH (ref 70–99)
Glucose-Capillary: 222 mg/dL — ABNORMAL HIGH (ref 70–99)

## 2020-06-05 MED ORDER — PREDNISONE 20 MG PO TABS: 40.0000 mg | ORAL_TABLET | Freq: Every day | ORAL | 0 refills | Status: AC

## 2020-06-05 MED ORDER — AZITHROMYCIN 500 MG PO TABS: 500.0000 mg | ORAL_TABLET | Freq: Every day | ORAL | 0 refills | Status: AC

## 2020-06-05 NOTE — Progress Notes (Signed)
Discharge instructions reviewed and discussed with the pt. See AVS. New prescriptions, medication schedule, diet, f/u appointments and activity discussed. All questions answered. IV dc'd tip intact.  Pt waiting for daughter to pick her up.

## 2020-06-05 NOTE — Discharge Summary (Signed)
Physician Discharge Summary  Patty Leon JJK:[id number] DOB: 1960-07-20 DOA: 06/03/2020  PCP: Danelle Berry, NP  Admit date: 06/03/2020 Discharge date: 06/05/2020  Admitted From: Home Disposition: Home with outpatient PT follow-up  Recommendations for Outpatient Follow-up:  1. Follow up with PCP in 1-2 weeks 2.   Home Health: No Equipment/Devices: Yes oxygen 3 L, home rate Discharge Condition: Stable CODE STATUS: Full Diet recommendation: Heart Healthy / Carb Modified Brief/Interim Summary: 60 y.o.femalewith medical history significant ofCOPD on home O2 that she uses as needed, hypertension, hyperlipidemia, diabetes, morbid obesity, obstructive sleep apnea, history of panic attacks and GERD who presents to the ER with progressive shortness of breath for the last week. Symptoms got worse in the last 3 days. Patient uses oxygen as needed but has been using 3 L at home for the last few days. Associated with some cough mildly productive. Denies any fever or chills no nausea vomiting or diarrhea. She was seen in the ER was tachypneic. Initial oxygen sats was 90% on 3 L. Patient received treatment in the ER with some minimal relief but still continued to be short of breath and wheezing. She is therefore being admitted to the hospital for inpatient care  Received IV steroids and DuoNeb therapy with improvement in respiratory status.  Returned to baseline 3 L oxygen within 24 hours of admission.  Air movement improved and patient ambulated on the day of discharge.  Found to be ambulatory without shortness of breath or desaturation.  Stable for discharge home at this time.  Will recommend prednisone 40 mg a day x5days.  Can resume home albuterol and Trelegy regimen in addition to home Daliresp.  Follow-up PCP and pulmonology post discharge.  Discharge Diagnoses:  Principal Problem:   COPD with acute exacerbation ([place]) Active Problems:   SIRS (systemic inflammatory response syndrome)  (HCC)   Diabetes (HCC)   GERD (gastroesophageal reflux disease)   Anxiety disorder   Acute on chronic respiratory failure with hypoxia (HCC)  Acute exacerbation of COPD Acute on chronic hypoxic respiratory failure secondary to above Obstructive sleep apnea on home CPAP Worsening oxygen requirement associated with shortness of breath Require 4 L on admission, baseline 3 L Was on IV Solu-Medrol and scheduled nebulizer regimen in house Return to baseline 3 L after 1 day of admission Ambulated on the day of discharge without desaturation or shortness of breath Defer discharge home at this time On discharge recommend continuation of home proair and trilogy Continue home Daliresp Azithromycin 500 mg p.o. daily x3 days, 1 dose remaining Prednisone 40 mg a day x5 days Follow-up outpatient PCP Resume home CPAP   Non-insulin-dependent diabetes mellitus Can resume home regimen on discharge Follow-up outpatient PCP  Essential hypertension Resume home regimen on discharge  Hyperlipidemia Continue statin  History of VTE Continue Xarelto  Morbid obesity Nutrition consult This complicates overall care and prognosis  Chronic pain Mid back pain Resume home regimen on discharge Outpatient PCP/pain management follow-up  Discharge Instructions  Discharge Instructions    Diet - low sodium heart healthy   Complete by: As directed    Increase activity slowly   Complete by: As directed      Allergies as of 06/05/2020      Reactions   Celebrex [celecoxib] Swelling   Dilaudid [hydromorphone] Itching      Medication List    STOP taking these medications   predniSONE 10 MG (21) Tbpk tablet Commonly known as: STERAPRED UNI-PAK 21 TAB Replaced by: predniSONE 20 MG tablet  TAKE these medications   albuterol 108 (90 Base) MCG/ACT inhaler Commonly known as: VENTOLIN HFA Inhale 2 puffs into the lungs every 6 (six) hours as needed for wheezing.   amLODipine 5 MG  tablet Commonly known as: NORVASC Take 5 mg by mouth every morning.   azithromycin 500 MG tablet Commonly known as: ZITHROMAX Take 1 tablet (500 mg total) by mouth daily for 1 day. Start taking on: June 06, 2020   Bydureon BCise 2 MG/0.85ML Auij Generic drug: Exenatide ER Inject 2 mg into the skin every Sunday.   docusate sodium 100 MG capsule Commonly known as: COLACE Take 100 mg by mouth daily.   hydrochlorothiazide 25 MG tablet Commonly known as: HYDRODIURIL Take 25 mg by mouth daily.   Klor-Con 10 10 MEQ tablet Generic drug: potassium chloride Take 10 mEq by mouth every Monday, Wednesday, and Friday.   losartan 25 MG tablet Commonly known as: COZAAR Take 25 mg by mouth daily.   metFORMIN 500 MG tablet Commonly known as: GLUCOPHAGE Take 500 mg by mouth 2 (two) times daily.   metoprolol tartrate 50 MG tablet Commonly known as: LOPRESSOR Take 50 mg by mouth every morning.   omeprazole 40 MG capsule Commonly known as: PRILOSEC Take 40 mg by mouth every morning.   oxyCODONE 5 MG immediate release tablet Commonly known as: Oxy IR/ROXICODONE Take 5 mg by mouth 3 (three) times daily as needed.   OXYGEN Inhale into the lungs.   polyethylene glycol powder 17 GM/SCOOP powder Commonly known as: GLYCOLAX/MIRALAX Take 17 g by mouth daily as needed for mild constipation.   predniSONE 20 MG tablet Commonly known as: DELTASONE Take 2 tablets (40 mg total) by mouth daily for 5 days. Start taking on: June 06, 2020 Replaces: predniSONE 10 MG (21) Tbpk tablet   roflumilast 500 MCG Tabs tablet Commonly known as: DALIRESP Take 500 mcg by mouth every morning.   rosuvastatin 10 MG tablet Commonly known as: CRESTOR Take 10 mg by mouth every evening.   Trelegy Ellipta 100-62.5-25 MCG/INH Aepb Generic drug: Fluticasone-Umeclidin-Vilant Inhale into the lungs daily.   Vitamin D 50 MCG (2000 UT) Caps Take 2,000 Units by mouth daily.   Xarelto 20 MG Tabs tablet Generic  drug: rivaroxaban Take 20 mg by mouth daily.       Allergies  Allergen Reactions   Celebrex [Celecoxib] Swelling   Dilaudid [Hydromorphone] Itching    Consultations:  None   Procedures/Studies: DG Chest 2 View  Result Date: 06/03/2020 CLINICAL DATA:  Short of breath for 1 day, COPD, increased oxygen demand EXAM: CHEST - 2 VIEW COMPARISON:  04/23/2017 FINDINGS: Frontal and lateral views of the chest demonstrate a stable cardiac silhouette. Lungs are hyperinflated, with chronic interstitial prominence consistent with emphysema. There is mild chronic central vascular congestion without airspace disease, effusion, or pneumothorax. IMPRESSION: 1. Emphysema. 2. Chronic central vascular congestion without airspace disease. Electronically Signed   By: Randa Ngo M.D.   On: 06/03/2020 20:03    (Echo, Carotid, EGD, Colonoscopy, ERCP)    Subjective: Seen and examined on the day of discharge.  Stable, no distress.  Shortness of breath improved.  Ambulated without desaturation.  Stable for discharge home  Discharge Exam: Vitals:   06/05/20 0709 06/05/20 0742  BP:  126/86  Pulse:  98  Resp:  17  Temp:  (!) 97.4 F (36.3 C)  SpO2: 90% 97%   Vitals:   06/04/20 2324 06/05/20 0350 06/05/20 0709 06/05/20 0742  BP: 130/71 129/68  126/86  Pulse: 89 97  98  Resp: 18 18  17   Temp: 97.6 F (36.4 C) 98 F (36.7 C)  (!) 97.4 F (36.3 C)  TempSrc:    Oral  SpO2: 94% 94% 90% 97%  Weight:      Height:        General: Pt is alert, awake, not in acute distress Cardiovascular: RRR, S1/S2 +, no rubs, no gallops Respiratory: Poor air movement but improved.  No wheezing.  Normal work of breathing.  3 L Abdominal: Obese, soft, nontender, nondistended, normal bowel sounds Extremities: no edema, no cyanosis    The results of significant diagnostics from this hospitalization (including imaging, microbiology, ancillary and laboratory) are listed below for reference.      Microbiology: Recent Results (from the past 240 hour(s))  SARS CORONAVIRUS 2 (TAT 6-24 HRS) Nasopharyngeal Nasopharyngeal Swab     Status: None   Collection Time: 06/03/20 11:16 PM   Specimen: Nasopharyngeal Swab  Result Value Ref Range Status   SARS Coronavirus 2 NEGATIVE NEGATIVE Final    Comment: (NOTE) SARS-CoV-2 target nucleic acids are NOT DETECTED.  The SARS-CoV-2 RNA is generally detectable in upper and lower respiratory specimens during the acute phase of infection. Negative results do not preclude SARS-CoV-2 infection, do not rule out co-infections with other pathogens, and should not be used as the sole basis for treatment or other patient management decisions. Negative results must be combined with clinical observations, patient history, and epidemiological information. The expected result is Negative.  Fact Sheet for Patients: SugarRoll.be  Fact Sheet for Healthcare Providers: https://www.woods-mathews.com/  This test is not yet approved or cleared by the [place] FDA and  has been authorized for detection and/or diagnosis of SARS-CoV-2 by FDA under an Emergency Use Authorization (EUA). This EUA will remain  in effect (meaning this test can be used) for the duration of the COVID-19 declaration under Se ction 564(b)(1) of the Act, 21 U.S.C. section 360bbb-3(b)(1), unless the authorization is terminated or revoked sooner.  Performed at [facility], [place] [address] [place], [place] [address]      Labs: BNP (last 3 results) No results for input(s): BNP in the last 8760 hours. Basic Metabolic Panel: Recent Labs  Lab 06/03/20 1944 06/04/20 0444  NA 138 138  K 4.1 3.9  CL 94* 94*  CO2 32 35*  GLUCOSE 153* 195*  BUN 10 11  CREATININE 0.67 0.63  CALCIUM 9.1 9.6   Liver Function Tests: No results for input(s): AST, ALT, ALKPHOS, BILITOT, PROT, ALBUMIN in the last 168 hours. No results for input(s):  LIPASE, AMYLASE in the last 168 hours. No results for input(s): AMMONIA in the last 168 hours. CBC: Recent Labs  Lab 06/03/20 1944 06/04/20 0444  WBC 8.5 7.8  HGB 15.4* 15.3*  HCT 49.2* 49.1*  MCV 94.1 95.2  PLT 260 241   Cardiac Enzymes: No results for input(s): CKTOTAL, CKMB, CKMBINDEX, TROPONINI in the last 168 hours. BNP: Invalid input(s): POCBNP CBG: Recent Labs  Lab 06/04/20 0722 06/04/20 1144 06/04/20 1658 06/04/20 2046 06/05/20 0743  GLUCAP 163* 260* 250* 225* 226*   D-Dimer Recent Labs    06/03/20 2144  DDIMER 0.47   Hgb A1c Recent Labs    06/04/20 0444  HGBA1C 7.0*   Lipid Profile No results for input(s): CHOL, HDL, LDLCALC, TRIG, CHOLHDL, LDLDIRECT in the last 72 hours. Thyroid function studies No results for input(s): TSH, T4TOTAL, T3FREE, THYROIDAB in the last 72 hours.  Invalid input(s): FREET3  Anemia work up No results for input(s): VITAMINB12, FOLATE, FERRITIN, TIBC, IRON, RETICCTPCT in the last 72 hours. Urinalysis    Component Value Date/Time   COLORURINE YELLOW (A) 04/25/2017 0651   APPEARANCEUR CLEAR (A) 04/25/2017 0651   APPEARANCEUR Clear 09/03/2012 0739   LABSPEC 1.011 04/25/2017 0651   LABSPEC 1.005 09/03/2012 0739   PHURINE 6.0 04/25/2017 0651   GLUCOSEU NEGATIVE 04/25/2017 0651   GLUCOSEU Negative 09/03/2012 0739   HGBUR MODERATE (A) 04/25/2017 0651   BILIRUBINUR NEGATIVE 04/25/2017 0651   BILIRUBINUR Negative 09/03/2012 0739   KETONESUR NEGATIVE 04/25/2017 0651   PROTEINUR NEGATIVE 04/25/2017 0651   NITRITE NEGATIVE 04/25/2017 0651   LEUKOCYTESUR NEGATIVE 04/25/2017 0651   LEUKOCYTESUR Negative 09/03/2012 0739   Sepsis Labs Invalid input(s): PROCALCITONIN,  WBC,  LACTICIDVEN Microbiology Recent Results (from the past 240 hour(s))  SARS CORONAVIRUS 2 (TAT 6-24 HRS) Nasopharyngeal Nasopharyngeal Swab     Status: None   Collection Time: 06/03/20 11:16 PM   Specimen: Nasopharyngeal Swab  Result Value Ref Range Status    SARS Coronavirus 2 NEGATIVE NEGATIVE Final    Comment: (NOTE) SARS-CoV-2 target nucleic acids are NOT DETECTED.  The SARS-CoV-2 RNA is generally detectable in upper and lower respiratory specimens during the acute phase of infection. Negative results do not preclude SARS-CoV-2 infection, do not rule out co-infections with other pathogens, and should not be used as the sole basis for treatment or other patient management decisions. Negative results must be combined with clinical observations, patient history, and epidemiological information. The expected result is Negative.  Fact Sheet for Patients: SugarRoll.be  Fact Sheet for Healthcare Providers: https://www.woods-mathews.com/  This test is not yet approved or cleared by the [place] FDA and  has been authorized for detection and/or diagnosis of SARS-CoV-2 by FDA under an Emergency Use Authorization (EUA). This EUA will remain  in effect (meaning this test can be used) for the duration of the COVID-19 declaration under Se ction 564(b)(1) of the Act, 21 U.S.C. section 360bbb-3(b)(1), unless the authorization is terminated or revoked sooner.  Performed at [facility], [place] [address] [place], [place] [address]      Time coordinating discharge: Over 30 minutes  SIGNED:   Sidney Ace, MD  [facility] 06/05/2020, 11:09 AM Pager   If 7PM-7AM, please contact night-coverage

## 2020-06-05 NOTE — Progress Notes (Signed)
Pt ambulated in hall on 3 L oxygen. No complaints.

## 2020-06-29 ENCOUNTER — Ambulatory Visit: Payer: Medicaid Other

## 2020-07-24 ENCOUNTER — Inpatient Hospital Stay
Admission: EM | Admit: 2020-07-24 | Discharge: 2020-07-28 | DRG: 191 | Disposition: A | Discharge status: Home / Self Care | Payer: Medicaid Other | Attending: Internal Medicine | Admitting: Internal Medicine

## 2020-07-24 ENCOUNTER — Encounter: Payer: Self-pay | Admitting: Intensive Care

## 2020-07-24 ENCOUNTER — Emergency Department: Payer: Medicaid Other

## 2020-07-24 ENCOUNTER — Other Ambulatory Visit: Payer: Self-pay

## 2020-07-24 DIAGNOSIS — F410 Panic disorder [episodic paroxysmal anxiety] without agoraphobia: Secondary | ICD-10-CM | POA: Diagnosis present

## 2020-07-24 DIAGNOSIS — Z6841 Body Mass Index (BMI) 40.0 and over, adult: Secondary | ICD-10-CM

## 2020-07-24 DIAGNOSIS — T380X5A Adverse effect of glucocorticoids and synthetic analogues, initial encounter: Secondary | ICD-10-CM | POA: Diagnosis present

## 2020-07-24 DIAGNOSIS — F419 Anxiety disorder, unspecified: Secondary | ICD-10-CM | POA: Diagnosis present

## 2020-07-24 DIAGNOSIS — Z7984 Long term (current) use of oral hypoglycemic drugs: Secondary | ICD-10-CM

## 2020-07-24 DIAGNOSIS — J9611 Chronic respiratory failure with hypoxia: Secondary | ICD-10-CM

## 2020-07-24 DIAGNOSIS — E119 Type 2 diabetes mellitus without complications: Secondary | ICD-10-CM

## 2020-07-24 DIAGNOSIS — Z23 Encounter for immunization: Secondary | ICD-10-CM

## 2020-07-24 DIAGNOSIS — E785 Hyperlipidemia, unspecified: Secondary | ICD-10-CM | POA: Diagnosis present

## 2020-07-24 DIAGNOSIS — E876 Hypokalemia: Secondary | ICD-10-CM | POA: Diagnosis present

## 2020-07-24 DIAGNOSIS — J449 Chronic obstructive pulmonary disease, unspecified: Secondary | ICD-10-CM | POA: Diagnosis present

## 2020-07-24 DIAGNOSIS — Z86718 Personal history of other venous thrombosis and embolism: Secondary | ICD-10-CM

## 2020-07-24 DIAGNOSIS — G4733 Obstructive sleep apnea (adult) (pediatric): Secondary | ICD-10-CM | POA: Diagnosis present

## 2020-07-24 DIAGNOSIS — E1165 Type 2 diabetes mellitus with hyperglycemia: Secondary | ICD-10-CM

## 2020-07-24 DIAGNOSIS — E66813 Obesity, class 3: Secondary | ICD-10-CM

## 2020-07-24 DIAGNOSIS — Z9981 Dependence on supplemental oxygen: Secondary | ICD-10-CM

## 2020-07-24 DIAGNOSIS — I10 Essential (primary) hypertension: Secondary | ICD-10-CM | POA: Diagnosis present

## 2020-07-24 DIAGNOSIS — M1990 Unspecified osteoarthritis, unspecified site: Secondary | ICD-10-CM | POA: Diagnosis present

## 2020-07-24 DIAGNOSIS — Z79899 Other long term (current) drug therapy: Secondary | ICD-10-CM

## 2020-07-24 DIAGNOSIS — E7800 Pure hypercholesterolemia, unspecified: Secondary | ICD-10-CM | POA: Diagnosis present

## 2020-07-24 DIAGNOSIS — F17210 Nicotine dependence, cigarettes, uncomplicated: Secondary | ICD-10-CM | POA: Diagnosis present

## 2020-07-24 DIAGNOSIS — J441 Chronic obstructive pulmonary disease with (acute) exacerbation: Principal | ICD-10-CM

## 2020-07-24 DIAGNOSIS — Z885 Allergy status to narcotic agent status: Secondary | ICD-10-CM

## 2020-07-24 DIAGNOSIS — K219 Gastro-esophageal reflux disease without esophagitis: Secondary | ICD-10-CM | POA: Diagnosis present

## 2020-07-24 DIAGNOSIS — Z886 Allergy status to analgesic agent status: Secondary | ICD-10-CM

## 2020-07-24 DIAGNOSIS — Z96652 Presence of left artificial knee joint: Secondary | ICD-10-CM | POA: Diagnosis present

## 2020-07-24 DIAGNOSIS — Z20822 Contact with and (suspected) exposure to covid-19: Secondary | ICD-10-CM | POA: Diagnosis present

## 2020-07-24 DIAGNOSIS — Z7901 Long term (current) use of anticoagulants: Secondary | ICD-10-CM

## 2020-07-24 HISTORY — DX: Morbid (severe) obesity due to excess calories: E66.01

## 2020-07-24 HISTORY — DX: Obesity, class 3: E66.813

## 2020-07-24 LAB — BASIC METABOLIC PANEL
Anion gap: 11 (ref 5–15)
BUN: 10 mg/dL (ref 6–20)
CO2: 35 mmol/L — ABNORMAL HIGH (ref 22–32)
Calcium: 9.4 mg/dL (ref 8.9–10.3)
Chloride: 94 mmol/L — ABNORMAL LOW (ref 98–111)
Creatinine, Ser: 0.57 mg/dL (ref 0.44–1.00)
GFR, Estimated: >60 mL/min (ref >60)
Glucose, Bld: 123 mg/dL — ABNORMAL HIGH (ref 70–99)
Potassium: 3.1 mmol/L — ABNORMAL LOW (ref 3.5–5.1)
Sodium: 140 mmol/L (ref 135–145)

## 2020-07-24 LAB — TROPONIN I (HIGH SENSITIVITY)
Troponin I (High Sensitivity): 6 ng/L (ref <18)
Troponin I (High Sensitivity): 6 ng/L (ref <18)

## 2020-07-24 LAB — CBC
HCT: 47.9 % — ABNORMAL HIGH (ref 36.0–46.0)
Hemoglobin: 15.3 g/dL — ABNORMAL HIGH (ref 12.0–15.0)
MCH: 29.6 pg (ref 26.0–34.0)
MCHC: 31.9 g/dL (ref 30.0–36.0)
MCV: 92.6 fL (ref 80.0–100.0)
Platelets: 262 10*3/uL (ref 150–400)
RBC: 5.17 MIL/uL — ABNORMAL HIGH (ref 3.87–5.11)
RDW: 13.5 % (ref 11.5–15.5)
WBC: 11.3 10*3/uL — ABNORMAL HIGH (ref 4.0–10.5)
nRBC: 0 % (ref 0.0–0.2)

## 2020-07-24 LAB — RESP PANEL BY RT-PCR (FLU A&B, COVID) ARPGX2
Influenza A by PCR: NEGATIVE
Influenza B by PCR: NEGATIVE
SARS Coronavirus 2 by RT PCR: NEGATIVE

## 2020-07-24 LAB — PROTIME-INR
INR: 1 (ref 0.8–1.2)
Prothrombin Time: 13 seconds (ref 11.4–15.2)

## 2020-07-24 MED ORDER — MORPHINE SULFATE (PF) 4 MG/ML IV SOLN
4.0000 mg | Freq: Once | INTRAVENOUS | Status: AC
  Administered 2020-07-24: 4 mg via INTRAVENOUS
  Filled 2020-07-24: qty 1

## 2020-07-24 MED ORDER — INSULIN ASPART 100 UNIT/ML IJ SOLN
0.0000 [IU] | Freq: Three times a day (TID) | INTRAMUSCULAR | Status: DC
  Administered 2020-07-25: 12:00:00 15 [IU] via SUBCUTANEOUS
  Administered 2020-07-25: 4 [IU] via SUBCUTANEOUS
  Administered 2020-07-25: 18:00:00 11 [IU] via SUBCUTANEOUS
  Administered 2020-07-26 (×3): 15 [IU] via SUBCUTANEOUS
  Administered 2020-07-27: 09:00:00 11 [IU] via SUBCUTANEOUS
  Administered 2020-07-27: 15 [IU] via SUBCUTANEOUS
  Administered 2020-07-27: 14:00:00 11 [IU] via SUBCUTANEOUS
  Administered 2020-07-28: 10:00:00 7 [IU] via SUBCUTANEOUS
  Filled 2020-07-24 (×11): qty 1

## 2020-07-24 MED ORDER — IPRATROPIUM-ALBUTEROL 0.5-2.5 (3) MG/3ML IN SOLN
3.0000 mL | Freq: Four times a day (QID) | RESPIRATORY_TRACT | Status: DC
  Administered 2020-07-25 (×4): 3 mL via RESPIRATORY_TRACT
  Filled 2020-07-24 (×4): qty 3

## 2020-07-24 MED ORDER — DOXYCYCLINE HYCLATE 100 MG PO TABS
100.0000 mg | ORAL_TABLET | Freq: Once | ORAL | Status: AC
  Administered 2020-07-24: 100 mg via ORAL
  Filled 2020-07-24: qty 1

## 2020-07-24 MED ORDER — INSULIN ASPART 100 UNIT/ML IJ SOLN
0.0000 [IU] | Freq: Every day | INTRAMUSCULAR | Status: DC
  Administered 2020-07-25 (×2): 3 [IU] via SUBCUTANEOUS
  Administered 2020-07-26: 21:00:00 4 [IU] via SUBCUTANEOUS
  Administered 2020-07-27: 21:00:00 5 [IU] via SUBCUTANEOUS
  Filled 2020-07-24 (×4): qty 1

## 2020-07-24 MED ORDER — PREDNISONE 20 MG PO TABS: 40.0000 mg | ORAL_TABLET | Freq: Every day | ORAL | Status: DC

## 2020-07-24 MED ORDER — METHYLPREDNISOLONE SODIUM SUCC 125 MG IJ SOLR
125.0000 mg | Freq: Once | INTRAMUSCULAR | Status: AC
  Administered 2020-07-24: 125 mg via INTRAVENOUS
  Filled 2020-07-24: qty 2

## 2020-07-24 MED ORDER — RIVAROXABAN 20 MG PO TABS
20.0000 mg | ORAL_TABLET | Freq: Every day | ORAL | Status: DC
  Administered 2020-07-25 – 2020-07-28 (×4): 20 mg via ORAL
  Filled 2020-07-24 (×4): qty 1

## 2020-07-24 MED ORDER — NICOTINE 14 MG/24HR TD PT24: 14.0000 mg | MEDICATED_PATCH | Freq: Every day | TRANSDERMAL | Status: DC

## 2020-07-24 MED ORDER — ALBUTEROL SULFATE (2.5 MG/3ML) 0.083% IN NEBU: 2.5000 mg | INHALATION_SOLUTION | RESPIRATORY_TRACT | Status: DC | PRN

## 2020-07-24 MED ORDER — METHYLPREDNISOLONE SODIUM SUCC 40 MG IJ SOLR
40.0000 mg | Freq: Four times a day (QID) | INTRAMUSCULAR | Status: AC
  Administered 2020-07-25 (×4): 40 mg via INTRAVENOUS
  Filled 2020-07-24 (×4): qty 1

## 2020-07-24 MED ORDER — IPRATROPIUM-ALBUTEROL 0.5-2.5 (3) MG/3ML IN SOLN
6.0000 mL | Freq: Once | RESPIRATORY_TRACT | Status: AC
  Administered 2020-07-24: 6 mL via RESPIRATORY_TRACT
  Filled 2020-07-24: qty 6

## 2020-07-24 MED ORDER — SODIUM CHLORIDE 0.9 % IV SOLN
1.0000 g | INTRAVENOUS | Status: AC
  Administered 2020-07-25 – 2020-07-26 (×3): 1 g via INTRAVENOUS
  Filled 2020-07-24 (×3): qty 1

## 2020-07-24 NOTE — H&P (Signed)
History and Physical    Patty Leon DXI:[id number] DOB: 1960-08-15 DOA: 07/24/2020  PCP: Danelle Berry, NP   Patient coming from: Home  I have personally briefly reviewed patient's old medical records in [place]  Chief Complaint: Chest pain, shortness of breath  HPI: Patty Leon is a 60 y.o. female with medical history significant for Severe COPD on O2 at 3 L, morbid obesity and OSA on nighttime BiPAP, HTN, DM, history of DVT on Xarelto, hospitalized a month prior for COPD exacerbation who completed a 1 month course of prednisone 20 daily from 4/8-5/10 prescribed by her PCP, who presents to the emergency room with a 3 to 4-day history of progressively worsening shortness of breath and cough productive of yellow phlegm associated with sharp stabbing upper mid back pain radiating to the chest, worse with coughing.  She denies fever or chills.  Her shortness of breath became acutely worse on the night of presentation prompting the visit to the emergency room.  She endorses compliance with her Xarelto.  She denies lower extremity pain or swelling.  She denies nausea, vomiting or diaphoresis.  Denies abdominal pain or change in bowel habits.  She continues to smoke. ED course: On arrival tachycardic at 112, tachypneic at 26 with O2 sat 98% on home flow rate of 3 L.  BP 125/94 and afebrile Troponin negative at 6/6, mild hypokalemia at 3.1, WBC 11.3, hemoglobin 15.3 EKG, personally viewed and interpreted sinus tachycardia at 123 Imaging: Chest x-ray with chronic diffuse bilateral interstitial opacities, likely chronic bronchitic change, difficult to exclude superimposed acute interstitial process.  No new focal consolidation  Patient treated in the ER with duo nebs and Solu-Medrol and given doxycycline.  Hospitalist consulted for admission.    Review of Systems: As per HPI otherwise all other systems on review of systems negative.    Past Medical History:  Diagnosis Date  .  Anxiety   . Arthritis   . COPD (chronic obstructive pulmonary disease) ([place])   . Diabetes mellitus without complication ([place])   . Diverticulosis   . Fluid retention   . GERD (gastroesophageal reflux disease)   . History of blood clots   . Hypercholesteremia   . Hypertension   . Obesity   . Panic attacks   . Shortness of breath dyspnea   . Sleep apnea    Use C-PAP with oxygen    Past Surgical History:  Procedure Laterality Date  . BILATERAL CARPAL TUNNEL RELEASE    . COLONOSCOPY    . COLONOSCOPY N/A 01/22/2015   Procedure: COLONOSCOPY;  Surgeon: Josefine Class, MD;  Location[facility] ENDOSCOPY;  Service: Endoscopy;  Laterality: N/A;  . COLONOSCOPY WITH PROPOFOL N/A 07/12/2016   Procedure: COLONOSCOPY WITH PROPOFOL;  Surgeon: Manya Silvas, MD;  Location: [facility] ENDOSCOPY;  Service: Endoscopy;  Laterality: N/A;  . COLONOSCOPY WITH PROPOFOL N/A 04/26/2018   Procedure: COLONOSCOPY WITH PROPOFOL;  Surgeon: Manya Silvas, MD;  Location: [facility] ENDOSCOPY;  Service: Endoscopy;  Laterality: N/A;  . ESOPHAGOGASTRODUODENOSCOPY (EGD) WITH PROPOFOL N/A 04/26/2018   Procedure: ESOPHAGOGASTRODUODENOSCOPY (EGD) WITH PROPOFOL;  Surgeon: Manya Silvas, MD;  Location: [facility] ENDOSCOPY;  Service: Endoscopy;  Laterality: N/A;  . JOINT REPLACEMENT Left 09/2015  . KNEE ARTHROSCOPY Left   . KNEE CLOSED REDUCTION Left 11/04/2015   Procedure: CLOSED MANIPULATION KNEE;  Surgeon: Hessie Knows, MD;  Location: [facility] ORS;  Service: Orthopedics;  Laterality: Left;  . KNEE CLOSED REDUCTION Left 04/04/2016   Procedure: CLOSED MANIPULATION KNEE;  Surgeon: Hessie Knows, MD;  Location: [facility] ORS;  Service: Orthopedics;  Laterality: Left;  . SHOULDER ARTHROSCOPY W/ ROTATOR CUFF REPAIR Right   . TOTAL KNEE ARTHROPLASTY Left 10/07/2015   Procedure: TOTAL KNEE ARTHROPLASTY;  Surgeon: Hessie Knows, MD;  Location: [facility] ORS;  Service: Orthopedics;  Laterality: Left;  . TOTAL KNEE REVISION Left 02/08/2016   Procedure: TOTAL  KNEE REVISION;  Surgeon: Hessie Knows, MD;  Location: [facility] ORS;  Service: Orthopedics;  Laterality: Left;  . TUBAL LIGATION       reports that she has been smoking cigarettes. She has a 15.00 pack-year smoking history. She has never used smokeless tobacco. She reports previous alcohol use. She reports previous drug use. Drug: Oxycodone.  Allergies  Allergen Reactions  . Celebrex [Celecoxib] Swelling  . Dilaudid [Hydromorphone] Itching    Family History  Problem Relation Age of Onset  . Lung cancer Mother   . Ovarian cancer Paternal Grandmother   . Breast cancer Neg Hx       Prior to Admission medications   Medication Sig Start Date End Date Taking? Authorizing Provider  albuterol (VENTOLIN HFA) 108 (90 Base) MCG/ACT inhaler Inhale 2 puffs into the lungs every 6 (six) hours as needed for wheezing.    [provider]  amLODipine (NORVASC) 5 MG tablet Take 5 mg by mouth every morning.     [provider]  BYDUREON BCISE 2 MG/0.85ML AUIJ Inject 2 mg into the skin every Sunday. 04/09/17   [provider]  Cholecalciferol (VITAMIN D) 2000 units CAPS Take 2,000 Units by mouth daily.    [provider]  docusate sodium (COLACE) 100 MG capsule Take 100 mg by mouth daily. 08/12/15   [provider]  hydrochlorothiazide (HYDRODIURIL) 25 MG tablet Take 25 mg by mouth daily. 09/13/15   [provider]  KLOR-CON 10 10 MEQ tablet Take 10 mEq by mouth every Monday, Wednesday, and Friday. 02/24/17   [provider]  losartan (COZAAR) 25 MG tablet Take 25 mg by mouth daily.    [provider]  metFORMIN (GLUCOPHAGE) 500 MG tablet Take 500 mg by mouth 2 (two) times daily. 07/26/15   [provider]  metoprolol (LOPRESSOR) 50 MG tablet Take 50 mg by mouth every morning.  08/29/15   [provider]  omeprazole (PRILOSEC) 40 MG capsule Take 40 mg by mouth every morning.  07/27/15   [provider]  oxyCODONE (OXY  IR/ROXICODONE) 5 MG immediate release tablet Take 5 mg by mouth 3 (three) times daily as needed. 02/29/16   [provider]  OXYGEN Inhale into the lungs.    [provider]  polyethylene glycol powder (GLYCOLAX/MIRALAX) powder Take 17 g by mouth daily as needed for mild constipation.  10/27/15   [provider]  roflumilast (DALIRESP) 500 MCG TABS tablet Take 500 mcg by mouth every morning.     [provider]  rosuvastatin (CRESTOR) 10 MG tablet Take 10 mg by mouth every evening. 11/05/13   [provider]  TRELEGY ELLIPTA 100-62.5-25 MCG/INH AEPB Inhale into the lungs daily.    [provider]  XARELTO 20 MG TABS tablet Take 20 mg by mouth daily. 03/01/16   [provider]    Physical Exam: Vitals:   07/24/20 1900 07/24/20 1915 07/24/20 1930 07/24/20 2200  BP: 125/88 127/72 (!) 125/94 129/76  Pulse: (!) 117 (!) 112 (!) 110 (!) 113  Resp: (!) 43 19 (!) 26 (!) 21  Temp:  TempSrc:      SpO2: 99% 96% 98% 92%  Weight:      Height:         Vitals:   07/24/20 1900 07/24/20 1915 07/24/20 1930 07/24/20 2200  BP: 125/88 127/72 (!) 125/94 129/76  Pulse: (!) 117 (!) 112 (!) 110 (!) 113  Resp: (!) 43 19 (!) 26 (!) 21  Temp:      TempSrc:      SpO2: 99% 96% 98% 92%  Weight:      Height:        Constitutional: Alert and oriented x 3 .  Mild conversational dyspnea HEENT:      Head: Normocephalic and atraumatic.         Eyes: PERLA, EOMI, Conjunctivae are normal. Sclera is non-icteric.       Mouth/Throat: Mucous membranes are moist.       Neck: Supple with no signs of meningismus. Cardiovascular:  Tachycardic. No murmurs, gallops, or rubs. 2+ symmetrical distal pulses are present . No JVD. No LE edema Respiratory: Respiratory effort slightly increased.Lungs sounds diminished bilaterally.  Few scattered rhonchi Gastrointestinal: Soft, non tender, and non distended with positive bowel sounds.  Genitourinary: No CVA  tenderness. Musculoskeletal: Nontender with normal range of motion in all extremities. No cyanosis, or erythema of extremities. Neurologic:  Face is symmetric. Moving all extremities. No gross focal neurologic deficits . Skin: Skin is warm, dry.  No rash or ulcers Psychiatric: Mood and affect are normal    Labs on Admission: I have personally reviewed following labs and imaging studies  CBC: Recent Labs  Lab 07/24/20 1743  WBC 11.3*  HGB 15.3*  HCT 47.9*  MCV 92.6  PLT [id number]   Basic Metabolic Panel: Recent Labs  Lab 07/24/20 1743  NA 140  K 3.1*  CL 94*  CO2 35*  GLUCOSE 123*  BUN 10  CREATININE 0.57  CALCIUM 9.4   GFR: Estimated Creatinine Clearance: 96.7 mL/min (by C-G formula based on SCr of 0.57 mg/dL). Liver Function Tests: No results for input(s): AST, ALT, ALKPHOS, BILITOT, PROT, ALBUMIN in the last 168 hours. No results for input(s): LIPASE, AMYLASE in the last 168 hours. No results for input(s): AMMONIA in the last 168 hours. Coagulation Profile: Recent Labs  Lab 07/24/20 1913  INR 1.0   Cardiac Enzymes: No results for input(s): CKTOTAL, CKMB, CKMBINDEX, TROPONINI in the last 168 hours. BNP (last 3 results) No results for input(s): PROBNP in the last 8760 hours. HbA1C: No results for input(s): HGBA1C in the last 72 hours. CBG: No results for input(s): GLUCAP in the last 168 hours. Lipid Profile: No results for input(s): CHOL, HDL, LDLCALC, TRIG, CHOLHDL, LDLDIRECT in the last 72 hours. Thyroid Function Tests: No results for input(s): TSH, T4TOTAL, FREET4, T3FREE, THYROIDAB in the last 72 hours. Anemia Panel: No results for input(s): VITAMINB12, FOLATE, FERRITIN, TIBC, IRON, RETICCTPCT in the last 72 hours. Urine analysis:    Component Value Date/Time   COLORURINE YELLOW (A) 04/25/2017 0651   APPEARANCEUR CLEAR (A) 04/25/2017 0651   APPEARANCEUR Clear 09/03/2012 0739   LABSPEC 1.011 04/25/2017 0651   LABSPEC 1.005 09/03/2012 0739   PHURINE 6.0  04/25/2017 0651   GLUCOSEU NEGATIVE 04/25/2017 0651   GLUCOSEU Negative 09/03/2012 0739   HGBUR MODERATE (A) 04/25/2017 0651   BILIRUBINUR NEGATIVE 04/25/2017 0651   BILIRUBINUR Negative 09/03/2012 [place] 04/25/2017 [place] NEGATIVE 04/25/2017 0651   NITRITE NEGATIVE 04/25/2017 0651   LEUKOCYTESUR NEGATIVE 04/25/2017 [id number]  LEUKOCYTESUR Negative 09/03/2012 0739    Radiological Exams on Admission: DG Chest 2 View  Result Date: 07/24/2020 CLINICAL DATA:  Chest pain.  Current smoker. EXAM: CHEST - 2 VIEW COMPARISON:  Chest radiograph 06/03/2020 FINDINGS: Stable cardiomediastinal contours. There are chronic coarse bilateral interstitial opacities, likely relating to smoking history. No new focal consolidation. No pneumothorax or large pleural effusion. No acute finding in the visualized skeleton. IMPRESSION: Chronic diffuse bilateral interstitial opacities, likely chronic bronchitic change, difficult to exclude superimposed acute interstitial process. No new focal consolidation. Electronically Signed   By: Audie Pinto M.D.   On: 07/24/2020 18:24     Assessment/Plan 60 year old female with severe COPD on O2 at 3 L, morbid obesity and OSA on nighttime BiPAP, HTN, DM, history of DVT on Xarelto, presenting with cough wheezing and pleuritic chest pain    Severe COPD with acute exacerbation (HCC)   OSA on nightly BiPAP   Chronic respiratory failure with hypoxia (HCC) - Hospitalized a month ago for COPD exacerbation and completed a 1 month course of prednisone 20 mg daily on 5/10 - Now presenting with cough productive of yellow phlegm and wheezing - Patient on multiple agents including Trelegy, Daliresp, Bydureon, Ventolin - Scheduled and as needed nebulized bronchodilators - IV steroids, mucolytics - Supplemental O2 with nighttime BiPAP as well as as needed - Counseled on smoking cessation nicotine patch offered but patient declined - Consider pulmonology  consult - IV Rocephin  Chest pain - Patient, with history of DVT on Xarelto with 3-day history of stabbing pain felt between scapula radiating to anterior chest, worse with coughing, but tachycardic and tachypneic on arrival - Patient is compliant with Xarelto and says she never misses a dose - Chest x-ray with no acute findings - Troponin negative x2 at 6/6, EKG with tachycardia but nonacute - D-dimer resulted after admission very slightly elevated at 0.61 but low suspicion for PE.  Tachycardia at the time of result had resolved we will continue to monitor - Pain control    Diabetes (HCC) - Sliding scale insulin coverage    Benign essential hypertension - Continue metoprolol and amlodipine    Chronic anticoagulation   History of DVT (deep vein thrombosis) - Continue Xarelto    Obesity, Class III, BMI 40-49.9 (morbid obesity) ([place]) - Complicating factor to overall prognosis and care    DVT prophylaxis: Xarelto Code Status: full code  Family Communication:  none  Disposition Plan: Back to previous home environment Consults called: none  Status:.  Observation    Athena Masse MD [facility]     07/24/2020, 10:43 PM

## 2020-07-24 NOTE — ED Triage Notes (Signed)
Patient c/o sharp central upper back pain that radiates to center of her chest. Reports SOB more today than normal. Reports coughing up yellow mucous. Wears 3L O2 chronically.

## 2020-07-24 NOTE — ED Provider Notes (Signed)
[facility] Emergency Department Provider Note ____________________________________________   Event Date/Time   First MD Initiated Contact with Patient 07/24/20 1837     (approximate)  I have reviewed the triage vital signs and the nursing notes.  HISTORY  Chief Complaint Shortness of Breath   HPI Patty Leon is a 60 y.o. femalewho presents to the ED for evaluation of shortness of breath.  Chart review indicates history of morbid obesity, HTN, HLD, DM, COPD on 3 L home O2.  History of VTE on Xarelto.   Patient presents to the ED for 3-4 days of progressively worsening shortness of breath and productive cough.  She reports pain to her substernal chest and thoracic back that is atraumatic.  She reports compliance with all of her medications without missed doses of Xarelto or running out of any of her inhalers.  Denies fevers, syncopal episodes, abdominal pain, emesis, diarrhea or dysuria.   Past Medical History:  Diagnosis Date  . Anxiety   . Arthritis   . COPD (chronic obstructive pulmonary disease) ([place])   . Diabetes mellitus without complication ([place])   . Diverticulosis   . Fluid retention   . GERD (gastroesophageal reflux disease)   . History of blood clots   . Hypercholesteremia   . Hypertension   . Obesity   . Panic attacks   . Shortness of breath dyspnea   . Sleep apnea    Use C-PAP with oxygen    Patient Active Problem List   Diagnosis Date Noted  . Chronic respiratory failure with hypoxia ([place]) 07/24/2020  . Chronic anticoagulation 07/24/2020  . History of DVT (deep vein thrombosis) 07/24/2020  . SIRS (systemic inflammatory response syndrome) ([place]) 06/03/2020  . Diabetes ([place]) 06/03/2020  . GERD (gastroesophageal reflux disease) 06/03/2020  . Anxiety disorder 06/03/2020  . Acute on chronic respiratory failure with hypoxia ([place]) 06/03/2020  . COPD with acute exacerbation ([place]) 04/23/2017  . 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 Primary localized osteoarthritis of left knee 10/07/2015  . Obstructive sleep apnea 08/05/2013  . Benign essential hypertension 01/16/2011    Past Surgical History:  Procedure Laterality Date  . BILATERAL CARPAL TUNNEL RELEASE    . COLONOSCOPY    . COLONOSCOPY N/A 01/22/2015   Procedure: COLONOSCOPY;  Surgeon: Josefine Class, MD;  Location[facility] ENDOSCOPY;  Service: Endoscopy;  Laterality: N/A;  . COLONOSCOPY WITH PROPOFOL N/A 07/12/2016   Procedure: COLONOSCOPY WITH PROPOFOL;  Surgeon: Manya Silvas, MD;  Location: [facility] ENDOSCOPY;  Service: Endoscopy;  Laterality: N/A;  . COLONOSCOPY WITH PROPOFOL N/A 04/26/2018   Procedure: COLONOSCOPY WITH PROPOFOL;  Surgeon: Manya Silvas, MD;  Location: [facility] ENDOSCOPY;  Service: Endoscopy;  Laterality: N/A;  . ESOPHAGOGASTRODUODENOSCOPY (EGD) WITH PROPOFOL N/A 04/26/2018   Procedure: ESOPHAGOGASTRODUODENOSCOPY (EGD) WITH PROPOFOL;  Surgeon: Manya Silvas, MD;  Location: [facility] ENDOSCOPY;  Service: Endoscopy;  Laterality: N/A;  . JOINT REPLACEMENT Left 09/2015  . KNEE ARTHROSCOPY Left   . KNEE CLOSED REDUCTION Left 11/04/2015   Procedure: CLOSED MANIPULATION KNEE;  Surgeon: Hessie Knows, MD;  Location: [facility] ORS;  Service: Orthopedics;  Laterality: Left;  . KNEE CLOSED REDUCTION Left 04/04/2016   Procedure: CLOSED MANIPULATION KNEE;  Surgeon: Hessie Knows, MD;  Location: [facility] ORS;  Service: Orthopedics;  Laterality: Left;  . SHOULDER ARTHROSCOPY W/ ROTATOR CUFF REPAIR Right   . TOTAL KNEE ARTHROPLASTY Left 10/07/2015   Procedure: TOTAL KNEE ARTHROPLASTY;  Surgeon: Hessie Knows, MD;  Location: [facility] ORS;  Service: Orthopedics;  Laterality: Left;  . TOTAL KNEE REVISION Left 02/08/2016   Procedure: TOTAL KNEE REVISION;  Surgeon: Hessie Knows, MD;  Location: [facility] ORS;  Service: Orthopedics;  Laterality: Left;  . TUBAL LIGATION      Prior to Admission medications   Medication Sig Start Date End Date Taking? Authorizing Provider   albuterol (VENTOLIN HFA) 108 (90 Base) MCG/ACT inhaler Inhale 2 puffs into the lungs every 6 (six) hours as needed for wheezing.    [provider]  amLODipine (NORVASC) 5 MG tablet Take 5 mg by mouth every morning.     [provider]  BYDUREON BCISE 2 MG/0.85ML AUIJ Inject 2 mg into the skin every Sunday. 04/09/17   [provider]  Cholecalciferol (VITAMIN D) 2000 units CAPS Take 2,000 Units by mouth daily.    [provider]  docusate sodium (COLACE) 100 MG capsule Take 100 mg by mouth daily. 08/12/15   [provider]  hydrochlorothiazide (HYDRODIURIL) 25 MG tablet Take 25 mg by mouth daily. 09/13/15   [provider]  KLOR-CON 10 10 MEQ tablet Take 10 mEq by mouth every Monday, Wednesday, and Friday. 02/24/17   [provider]  losartan (COZAAR) 25 MG tablet Take 25 mg by mouth daily.    [provider]  metFORMIN (GLUCOPHAGE) 500 MG tablet Take 500 mg by mouth 2 (two) times daily. 07/26/15   [provider]  metoprolol (LOPRESSOR) 50 MG tablet Take 50 mg by mouth every morning.  08/29/15   [provider]  omeprazole (PRILOSEC) 40 MG capsule Take 40 mg by mouth every morning.  07/27/15   [provider]  oxyCODONE (OXY IR/ROXICODONE) 5 MG immediate release tablet Take 5 mg by mouth 3 (three) times daily as needed. 02/29/16   [provider]  OXYGEN Inhale into the lungs.    [provider]  polyethylene glycol powder (GLYCOLAX/MIRALAX) powder Take 17 g by mouth daily as needed for mild constipation.  10/27/15   [provider]  roflumilast (DALIRESP) 500 MCG TABS tablet Take 500 mcg by mouth every morning.     [provider]  rosuvastatin (CRESTOR) 10 MG tablet Take 10 mg by mouth every evening. 11/05/13   [provider]  TRELEGY ELLIPTA 100-62.5-25 MCG/INH AEPB Inhale into the lungs daily.    [provider]  XARELTO 20 MG TABS tablet Take 20 mg  by mouth daily. 03/01/16   [provider]    Allergies Celebrex [celecoxib] and Dilaudid [hydromorphone]  Family History  Problem Relation Age of Onset  . Lung cancer Mother   . Ovarian cancer Paternal Grandmother   . Breast cancer Neg Hx     Social History Social History   Tobacco Use  . Smoking status: Current Every Day Smoker    Packs/day: 0.50    Years: 30.00    Pack years: 15.00    Types: Cigarettes  . Smokeless tobacco: Never Used  Vaping Use  . Vaping Use: Never used  Substance Use Topics  . Alcohol use: Not Currently  . Drug use: Not Currently    Types: Oxycodone    Comment: as prescribed by MD    Review of Systems  Constitutional: No fever/chills Eyes: No visual changes. ENT: No sore throat. Cardiovascular: Positive for chest pain. Respiratory: Positive for productive cough and shortness of breath. Gastrointestinal: No abdominal pain.  No nausea, no vomiting.  No diarrhea.  No constipation. Genitourinary: Negative for dysuria. Musculoskeletal: Positive for atraumatic thoracic back pain. Skin:  Negative for rash. Neurological: Negative for headaches, focal weakness or numbness. ____________________________________________   PHYSICAL EXAM:  VITAL SIGNS: Vitals:   07/24/20 1930 07/24/20 2200  BP: (!) 125/94 129/76  Pulse: (!) 110 (!) 113  Resp: (!) 26 (!) 21  Temp:    SpO2: 98% 92%    Constitutional: Alert and oriented.  Obese.  Sitting up on side of the bed and slightly tachypneic.  Conversational in full sentences. Eyes: Conjunctivae are normal. PERRL. EOMI. Head: Atraumatic. Nose: No congestion/rhinnorhea. Mouth/Throat: Mucous membranes are moist.  Oropharynx non-erythematous. Neck: No stridor. No cervical spine tenderness to palpation. Cardiovascular: Tachycardic rate, regular rhythm. Grossly normal heart sounds.  Good peripheral circulation. Respiratory: Tachypneic to the low 20s.  No retractions.  Poor air movement throughout and  diffuse expiratory wheezes. Gastrointestinal: Soft , nondistended, nontender to palpation. No CVA tenderness. Musculoskeletal: No lower extremity tenderness nor edema.  No joint effusions. No signs of acute trauma. Neurologic:  Normal speech and language. No gross focal neurologic deficits are appreciated.  Skin:  Skin is warm, dry and intact. No rash noted. Psychiatric: Mood and affect are normal. Speech and behavior are normal. ____________________________________________   LABS (all labs ordered are listed, but only abnormal results are displayed)  Labs Reviewed  BASIC METABOLIC PANEL - Abnormal; Notable for the following components:      Result Value   Potassium 3.1 (*)    Chloride 94 (*)    CO2 35 (*)    Glucose, Bld 123 (*)    All other components within normal limits  CBC - Abnormal; Notable for the following components:   WBC 11.3 (*)    RBC 5.17 (*)    Hemoglobin 15.3 (*)    HCT 47.9 (*)    All other components within normal limits  RESP PANEL BY RT-PCR (FLU A&B, COVID) ARPGX2  PROTIME-INR  POC URINE PREG, ED  TROPONIN I (HIGH SENSITIVITY)  TROPONIN I (HIGH SENSITIVITY)   ____________________________________________  12 Lead EKG  Sinus rhythm, rate of 123 bpm.  Normal axis and intervals.  No evidence of acute ischemia.  Sinus tachycardia. ____________________________________________  RADIOLOGY  ED MD interpretation: 2 view CXR reviewed by me with diffuse interstitial increased markings without discrete lobar infiltration  Official radiology report(s): DG Chest 2 View  Result Date: 07/24/2020 CLINICAL DATA:  Chest pain.  Current smoker. EXAM: CHEST - 2 VIEW COMPARISON:  Chest radiograph 06/03/2020 FINDINGS: Stable cardiomediastinal contours. There are chronic coarse bilateral interstitial opacities, likely relating to smoking history. No new focal consolidation. No pneumothorax or large pleural effusion. No acute finding in the visualized skeleton. IMPRESSION:  Chronic diffuse bilateral interstitial opacities, likely chronic bronchitic change, difficult to exclude superimposed acute interstitial process. No new focal consolidation. Electronically Signed   By: Audie Pinto M.D.   On: 07/24/2020 18:24    ____________________________________________   PROCEDURES and INTERVENTIONS  Procedure(s) performed (including Critical Care):  .1-3 Lead EKG Interpretation Performed by: Vladimir Crofts, MD Authorized by: Vladimir Crofts, MD     Interpretation: abnormal     ECG rate:  110   ECG rate assessment: tachycardic     Rhythm: sinus tachycardia     Ectopy: none     Conduction: normal      Medications  methylPREDNISolone sodium succinate (SOLU-MEDROL) 125 mg/2 mL injection 125 mg (125 mg Intravenous Given 07/24/20 1933)  ipratropium-albuterol (DUONEB) 0.5-2.5 (3) MG/3ML nebulizer solution 6 mL (6 mLs Nebulization Given 07/24/20 1932)  morphine 4 MG/ML injection 4 mg (4 mg  Intravenous Given 07/24/20 1947)  doxycycline (VIBRA-TABS) tablet 100 mg (100 mg Oral Given 07/24/20 2033)    ____________________________________________   MDM / ED COURSE   60 year old woman with severe COPD and chronic hypoxic respiratory failure who presents to the ED with evidence of COPD exacerbation requiring medical admission.  Persistently tachycardic in a sinus tachycardia, but not hypoxic on her home O2.  Exam with uncomfortable-appearing patient with persistent wheezing, tachypnea despite breathing treatments and steroids.  No significant distress, neurologic or vascular deficits.  CXR with some increased chronic interstitial opacities, in combination with her increased sputum production is concerning for possible infectious etiology of her symptoms, so provided doxycycline.  No evidence of sepsis, ACS.  Due to her continued symptoms, wheezing and tachycardia, discussed the case with hospitalist medicine for observation admission.   Clinical Course as of 07/24/20 2225   Sat Jul 24, 2020  2039 Reassessed.  Patient reports slightly improved symptoms.  She still is tachycardic, tachypneic though not hypoxic.  We discussed medical admission and she is agreeable. [DS]  2224 Discussed the case with Dr. Damita Dunnings, who agrees to admit [DS]    Clinical Course User Index [DS] Vladimir Crofts, MD    ____________________________________________   FINAL CLINICAL IMPRESSION(S) / ED DIAGNOSES  Final diagnoses:  COPD exacerbation [facility])     ED Discharge Orders    None       Patty Leon   Note:  This document was prepared using Dragon voice recognition software and may include unintentional dictation errors.   Vladimir Crofts, MD 07/24/20 2230

## 2020-07-25 ENCOUNTER — Encounter: Payer: Self-pay | Admitting: Internal Medicine

## 2020-07-25 DIAGNOSIS — F17210 Nicotine dependence, cigarettes, uncomplicated: Secondary | ICD-10-CM | POA: Diagnosis present

## 2020-07-25 DIAGNOSIS — F419 Anxiety disorder, unspecified: Secondary | ICD-10-CM | POA: Diagnosis present

## 2020-07-25 DIAGNOSIS — T380X5A Adverse effect of glucocorticoids and synthetic analogues, initial encounter: Secondary | ICD-10-CM | POA: Diagnosis present

## 2020-07-25 DIAGNOSIS — Z9981 Dependence on supplemental oxygen: Secondary | ICD-10-CM | POA: Diagnosis not present

## 2020-07-25 DIAGNOSIS — Z6841 Body Mass Index (BMI) 40.0 and over, adult: Secondary | ICD-10-CM | POA: Diagnosis not present

## 2020-07-25 DIAGNOSIS — E1165 Type 2 diabetes mellitus with hyperglycemia: Secondary | ICD-10-CM | POA: Diagnosis present

## 2020-07-25 DIAGNOSIS — Z7901 Long term (current) use of anticoagulants: Secondary | ICD-10-CM | POA: Diagnosis not present

## 2020-07-25 DIAGNOSIS — I10 Essential (primary) hypertension: Secondary | ICD-10-CM | POA: Diagnosis present

## 2020-07-25 DIAGNOSIS — Z885 Allergy status to narcotic agent status: Secondary | ICD-10-CM | POA: Diagnosis not present

## 2020-07-25 DIAGNOSIS — G4733 Obstructive sleep apnea (adult) (pediatric): Secondary | ICD-10-CM | POA: Diagnosis present

## 2020-07-25 DIAGNOSIS — K219 Gastro-esophageal reflux disease without esophagitis: Secondary | ICD-10-CM | POA: Diagnosis present

## 2020-07-25 DIAGNOSIS — Z86718 Personal history of other venous thrombosis and embolism: Secondary | ICD-10-CM | POA: Diagnosis not present

## 2020-07-25 DIAGNOSIS — Z886 Allergy status to analgesic agent status: Secondary | ICD-10-CM | POA: Diagnosis not present

## 2020-07-25 DIAGNOSIS — M1990 Unspecified osteoarthritis, unspecified site: Secondary | ICD-10-CM | POA: Diagnosis present

## 2020-07-25 DIAGNOSIS — F410 Panic disorder [episodic paroxysmal anxiety] without agoraphobia: Secondary | ICD-10-CM | POA: Diagnosis present

## 2020-07-25 DIAGNOSIS — E785 Hyperlipidemia, unspecified: Secondary | ICD-10-CM | POA: Diagnosis present

## 2020-07-25 DIAGNOSIS — R0602 Shortness of breath: Secondary | ICD-10-CM | POA: Diagnosis present

## 2020-07-25 DIAGNOSIS — J441 Chronic obstructive pulmonary disease with (acute) exacerbation: Secondary | ICD-10-CM | POA: Diagnosis present

## 2020-07-25 DIAGNOSIS — E876 Hypokalemia: Secondary | ICD-10-CM | POA: Diagnosis present

## 2020-07-25 DIAGNOSIS — Z20822 Contact with and (suspected) exposure to covid-19: Secondary | ICD-10-CM | POA: Diagnosis present

## 2020-07-25 DIAGNOSIS — E7800 Pure hypercholesterolemia, unspecified: Secondary | ICD-10-CM | POA: Diagnosis present

## 2020-07-25 DIAGNOSIS — J9611 Chronic respiratory failure with hypoxia: Secondary | ICD-10-CM | POA: Diagnosis present

## 2020-07-25 DIAGNOSIS — Z23 Encounter for immunization: Secondary | ICD-10-CM | POA: Diagnosis not present

## 2020-07-25 DIAGNOSIS — Z96652 Presence of left artificial knee joint: Secondary | ICD-10-CM | POA: Diagnosis present

## 2020-07-25 LAB — COMPREHENSIVE METABOLIC PANEL
ALT: 12 U/L (ref 0–44)
AST: 21 U/L (ref 15–41)
Albumin: 3.4 g/dL — ABNORMAL LOW (ref 3.5–5.0)
Alkaline Phosphatase: 81 U/L (ref 38–126)
Anion gap: 11 (ref 5–15)
BUN: 11 mg/dL (ref 6–20)
CO2: 33 mmol/L — ABNORMAL HIGH (ref 22–32)
Calcium: 9.6 mg/dL (ref 8.9–10.3)
Chloride: 92 mmol/L — ABNORMAL LOW (ref 98–111)
Creatinine, Ser: 0.62 mg/dL (ref 0.44–1.00)
GFR, Estimated: >60 mL/min (ref >60)
Glucose, Bld: 350 mg/dL — ABNORMAL HIGH (ref 70–99)
Potassium: 3.5 mmol/L (ref 3.5–5.1)
Sodium: 136 mmol/L (ref 135–145)
Total Bilirubin: 0.4 mg/dL (ref 0.3–1.2)
Total Protein: 7.9 g/dL (ref 6.5–8.1)

## 2020-07-25 LAB — MAGNESIUM: Magnesium: 2.2 mg/dL (ref 1.7–2.4)

## 2020-07-25 LAB — D-DIMER, QUANTITATIVE: D-Dimer, Quant: 0.61 ug/mL-FEU — ABNORMAL HIGH (ref 0.00–0.50)

## 2020-07-25 LAB — GLUCOSE, CAPILLARY
Glucose-Capillary: 157 mg/dL — ABNORMAL HIGH (ref 70–99)
Glucose-Capillary: 257 mg/dL — ABNORMAL HIGH (ref 70–99)
Glucose-Capillary: 267 mg/dL — ABNORMAL HIGH (ref 70–99)
Glucose-Capillary: 283 mg/dL — ABNORMAL HIGH (ref 70–99)
Glucose-Capillary: 316 mg/dL — ABNORMAL HIGH (ref 70–99)

## 2020-07-25 LAB — HIV ANTIBODY (ROUTINE TESTING W REFLEX): HIV Screen 4th Generation wRfx: NONREACTIVE

## 2020-07-25 MED ORDER — LOSARTAN POTASSIUM 25 MG PO TABS
25.0000 mg | ORAL_TABLET | Freq: Every day | ORAL | Status: DC
  Filled 2020-07-25: qty 1

## 2020-07-25 MED ORDER — UMECLIDINIUM BROMIDE 62.5 MCG/INH IN AEPB
1.0000 | INHALATION_SPRAY | Freq: Every day | RESPIRATORY_TRACT | Status: DC
  Administered 2020-07-25 – 2020-07-28 (×4): 1 via RESPIRATORY_TRACT
  Filled 2020-07-25: qty 7

## 2020-07-25 MED ORDER — POTASSIUM CHLORIDE CRYS ER 20 MEQ PO TBCR
40.0000 meq | EXTENDED_RELEASE_TABLET | Freq: Every day | ORAL | Status: DC
  Administered 2020-07-25 – 2020-07-28 (×4): 40 meq via ORAL
  Filled 2020-07-25 (×5): qty 2

## 2020-07-25 MED ORDER — PNEUMOCOCCAL VAC POLYVALENT 25 MCG/0.5ML IJ INJ
0.5000 mL | INJECTION | INTRAMUSCULAR | Status: AC
  Administered 2020-07-28: 11:00:00 0.5 mL via INTRAMUSCULAR
  Filled 2020-07-25: qty 0.5

## 2020-07-25 MED ORDER — OXYCODONE HCL 5 MG PO TABS
5.0000 mg | ORAL_TABLET | Freq: Three times a day (TID) | ORAL | Status: DC | PRN
  Administered 2020-07-25 – 2020-07-28 (×7): 5 mg via ORAL
  Filled 2020-07-25 (×7): qty 1

## 2020-07-25 MED ORDER — LOSARTAN POTASSIUM 25 MG PO TABS
12.5000 mg | ORAL_TABLET | Freq: Every day | ORAL | Status: DC
  Administered 2020-07-26 – 2020-07-28 (×3): 12.5 mg via ORAL
  Filled 2020-07-25 (×3): qty 1

## 2020-07-25 MED ORDER — ALBUTEROL SULFATE HFA 108 (90 BASE) MCG/ACT IN AERS: 2.0000 | INHALATION_SPRAY | Freq: Four times a day (QID) | RESPIRATORY_TRACT | Status: DC | PRN

## 2020-07-25 MED ORDER — METOPROLOL TARTRATE 50 MG PO TABS
50.0000 mg | ORAL_TABLET | ORAL | Status: DC
  Administered 2020-07-25 – 2020-07-28 (×4): 50 mg via ORAL
  Filled 2020-07-25 (×4): qty 1

## 2020-07-25 MED ORDER — FLUTICASONE-UMECLIDIN-VILANT 100-62.5-25 MCG/INH IN AEPB: INHALATION_SPRAY | Freq: Every day | RESPIRATORY_TRACT | Status: DC

## 2020-07-25 MED ORDER — FLUTICASONE FUROATE-VILANTEROL 100-25 MCG/INH IN AEPB
1.0000 | INHALATION_SPRAY | Freq: Every day | RESPIRATORY_TRACT | Status: DC
  Administered 2020-07-25 – 2020-07-28 (×4): 1 via RESPIRATORY_TRACT
  Filled 2020-07-25: qty 28

## 2020-07-25 MED ORDER — POTASSIUM CHLORIDE CRYS ER 20 MEQ PO TBCR: 40.0000 meq | EXTENDED_RELEASE_TABLET | Freq: Every day | ORAL | Status: DC

## 2020-07-25 MED ORDER — AMLODIPINE BESYLATE 5 MG PO TABS
5.0000 mg | ORAL_TABLET | ORAL | Status: DC
  Filled 2020-07-25: qty 1

## 2020-07-25 MED ORDER — POLYETHYLENE GLYCOL 3350 17 GM/SCOOP PO POWD
17.0000 g | Freq: Every day | ORAL | Status: DC | PRN
  Filled 2020-07-25: qty 255

## 2020-07-25 MED ORDER — HYDROCHLOROTHIAZIDE 25 MG PO TABS
25.0000 mg | ORAL_TABLET | Freq: Every day | ORAL | Status: DC
  Filled 2020-07-25: qty 1

## 2020-07-25 MED ORDER — EXENATIDE ER 2 MG/0.85ML ~~LOC~~ AUIJ: 2.0000 mg | AUTO-INJECTOR | SUBCUTANEOUS | Status: DC

## 2020-07-25 MED ORDER — METFORMIN HCL 500 MG PO TABS
500.0000 mg | ORAL_TABLET | Freq: Two times a day (BID) | ORAL | Status: DC
  Administered 2020-07-25 – 2020-07-28 (×6): 500 mg via ORAL
  Filled 2020-07-25 (×7): qty 1

## 2020-07-25 MED ORDER — CYCLOBENZAPRINE HCL 10 MG PO TABS
5.0000 mg | ORAL_TABLET | Freq: Three times a day (TID) | ORAL | Status: DC | PRN
  Administered 2020-07-25: 22:00:00 5 mg via ORAL
  Filled 2020-07-25: qty 1

## 2020-07-25 NOTE — Progress Notes (Signed)
OT Cancellation Note  Patient Details Name: Patty Leon MRN: [id number] DOB: 08-29-1960   Cancelled Treatment:    Reason Eval/Treat Not Completed: OT screened, no needs identified, will sign off.  Order received and chart reviewed. Pt reporting baseline functional independence to perform functional mobility and ADLs. No skilled OT needs identified at this time. Will sign off. Please re-consult if additional needs arise.   Fredirick Maudlin, OTR/L [place]

## 2020-07-25 NOTE — Progress Notes (Signed)
PROGRESS NOTE   Patty Leon  WJX:[id number] DOB: 01/12/1961 DOA: 07/24/2020 PCP: Danelle Berry, NP  Brief Narrative:  60 year old female  severe COPD-still smoker-she follows chronically with Dr. 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 HTN OSA BiPAP DVT on Xarelto  baseline is she is able to walk around at times but needs to take frequent breaks and this has been going on for the past several months to may be years-she uses oxygen intermittently ongoing out of the house and takes a portable oxygen with her For the past 3 weeks that she has been using oxygen daily even around the house  Last admitted 3/24 through 06/05/2020 COPD exacerbation requiring IV steroids increasing oxygen  Dimer 0.61 INR 1.0 flu and COVID-negative Potassium 3.1 BUNs/creatinine 10/0.5 troponin trend flat at 6 WBC 11.3 hemoglobin 15  Hospital-Problem based course Severe COPD followed by Dr. Raul Del Transition from Solu-Medrol to prednisone 40 daily Continue ceftriaxone-narrow to orals Ambulate out of bed whe able--FLutter valve every 2 hourly as tolerated Chronic hypoxic respiratory failure on home oxygen for several years Underlying OSA on BiPAP Intermittently uses oxygen at rest at home and cannot ambulate far Probably will need outpatient spirometry Has restrictive component to her chronic respiratory failure Will follow-up in the outpatient setting with pulmonologist DM TY 2 Continue sliding scale coverage continue CBGs checks 157-283 this morning Continue metformin 500 twice daily, continue exenatide 2 mg We will add long-acting insulin if continues to be above 250 HTN Resume losartan, HCTZ, other meds DVT on Xarelto Continue Xarelto at this time D-dimer was elevated on admission but she is not having any hypoxia any pleurisy any chest pain and I do not think she has a PE given her findings BMI 52 Life-threatening-needs outpatient weight  loss counseling-bariatric surgery? Hypokalemia Replace orally-recheck labs a.m.  DVT prophylaxis: Xarelto Code Status: Full Family Communication: None Disposition:  Status is: Observation  The patient will require care spanning > 2 midnights and should be moved to inpatient because: Hemodynamically unstable, Persistent severe electrolyte disturbances, Ongoing active pain requiring inpatient pain management and Unsafe d/c plan  Dispo: The patient is from: Home              Anticipated d/c is to: Home              Patient currently is not medically stable to d/c.   Difficult to place patient No       Consultants:   None currently  Procedures: X-rays   Antimicrobials: Ceftriaxone   Subjective: Awake coherent no distress doing fair talking in full sentences Tells me that she was not talking full sentences on admission was very winded and was unable to any ADLs Her  Objective: Vitals:   07/25/20 0150 07/25/20 0225 07/25/20 0642 07/25/20 0827  BP:   121/64 129/64  Pulse:   79 (!) 102  Resp:   20   Temp:   98 F (36.7 C) 97.9 F (36.6 C)  TempSrc:    Oral  SpO2: 94% 95% 95% 100%  Weight:      Height:        Intake/Output Summary (Last 24 hours) at 07/25/2020 1108 Last data filed at 07/25/2020 1019 Gross per 24 hour  Intake 340.4 ml  Output --  Net 340.4 ml   Filed Weights   07/24/20 1730 07/25/20 0000  Weight: 124.7 kg 129.9 kg    Examination:  EOMI NCAT no focal  deficit thick neck Mallampati 4 CTA B no rales rhonchi no wheeze Abdomen significantly obese nontender nondistended no rebound or guarding Trace lower extremity edema S1-S2 no murmur rub or gallop sinus tach 90 range Neurologically intact no focal deficit moving all 4 limbs equally  Data Reviewed: personally reviewed   CBC    Component Value Date/Time   WBC 11.3 (H) 07/24/2020 1743   RBC 5.17 (H) 07/24/2020 1743   HGB 15.3 (H) 07/24/2020 1743   HGB 14.3 06/11/2014 2212   HCT 47.9 (H)  07/24/2020 1743   HCT 43.5 06/11/2014 2212   PLT 262 07/24/2020 1743   PLT 258 06/11/2014 2212   MCV 92.6 07/24/2020 1743   MCV 91 06/11/2014 2212   MCH 29.6 07/24/2020 1743   MCHC 31.9 07/24/2020 1743   RDW 13.5 07/24/2020 1743   RDW 14.0 06/11/2014 2212   LYMPHSABS 0.6 (L) 11/17/2013 0318   MONOABS 0.4 11/17/2013 0318   EOSABS 0.0 11/17/2013 0318   BASOSABS 0.0 11/17/2013 0318   CMP Latest Ref Rng & Units 07/24/2020 06/04/2020 06/03/2020  Glucose 70 - 99 mg/dL 123(H) 195(H) 153(H)  BUN 6 - 20 mg/dL 10 11 10   Creatinine 0.44 - 1.00 mg/dL 0.57 0.63 0.67  Sodium 135 - 145 mmol/L 140 138 138  Potassium 3.5 - 5.1 mmol/L 3.1(L) 3.9 4.1  Chloride 98 - 111 mmol/L 94(L) 94(L) 94(L)  CO2 22 - 32 mmol/L 35(H) 35(H) 32  Calcium 8.9 - 10.3 mg/dL 9.4 9.6 9.1  Total Protein 6.5 - 8.1 g/dL - - -  Total Bilirubin 0.3 - 1.2 mg/dL - - -  Alkaline Phos 38 - 126 U/L - - -  AST 15 - 41 U/L - - -  ALT 14 - 54 U/L - - -     Radiology Studies: DG Chest 2 View  Result Date: 07/24/2020 CLINICAL DATA:  Chest pain.  Current smoker. EXAM: CHEST - 2 VIEW COMPARISON:  Chest radiograph 06/03/2020 FINDINGS: Stable cardiomediastinal contours. There are chronic coarse bilateral interstitial opacities, likely relating to smoking history. No new focal consolidation. No pneumothorax or large pleural effusion. No acute finding in the visualized skeleton. IMPRESSION: Chronic diffuse bilateral interstitial opacities, likely chronic bronchitic change, difficult to exclude superimposed acute interstitial process. No new focal consolidation. Electronically Signed   By: Audie Pinto M.D.   On: 07/24/2020 18:24     Scheduled Meds: . amLODipine  5 mg Oral BH-q7a  . Exenatide ER  2 mg Subcutaneous Q Sun  . Fluticasone-Umeclidin-Vilant   Inhalation Daily  . hydrochlorothiazide  25 mg Oral Daily  . insulin aspart  0-20 Units Subcutaneous TID WC  . insulin aspart  0-5 Units Subcutaneous QHS  . ipratropium-albuterol  3  mL Nebulization Q6H  . losartan  25 mg Oral Daily  . metFORMIN  500 mg Oral BID  . methylPREDNISolone (SOLU-MEDROL) injection  40 mg Intravenous Q6H   Followed by  . [START ON 07/26/2020] predniSONE  40 mg Oral Q breakfast  . metoprolol tartrate  50 mg Oral BH-q7a  . [START ON 07/26/2020] pneumococcal 23 valent vaccine  0.5 mL Intramuscular Tomorrow-1000  . potassium chloride  40 mEq Oral Daily  . rivaroxaban  20 mg Oral Daily   Continuous Infusions: . cefTRIAXone (ROCEPHIN)  IV Stopped (07/25/20 0220)     LOS: 0 days   Time spent: [place], MD [facility] To contact the attending provider between 7A-7P or the covering provider during after hours 7P-7A, please log into  the web site [email or web address] and access using universal [place] password for that web site. If you do not have the password, please call the hospital operator.  07/25/2020, 11:08 AM

## 2020-07-25 NOTE — Hospital Course (Signed)
60 year old female  severe COPD-still smoker-she follows chronically with Dr. 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

## 2020-07-25 NOTE — Plan of Care (Signed)
  Problem: Education: Goal: Knowledge of disease or condition will improve Outcome: Progressing Goal: Knowledge of the prescribed therapeutic regimen will improve Outcome: Progressing Goal: Individualized Educational Video(s) Outcome: Progressing   Problem: Activity: Goal: Ability to tolerate increased activity will improve Outcome: Progressing Goal: Will verbalize the importance of balancing activity with adequate rest periods Outcome: Progressing   Problem: Respiratory: Goal: Ability to maintain a clear airway will improve Outcome: Progressing Goal: Levels of oxygenation will improve Outcome: Progressing Goal: Ability to maintain adequate ventilation will improve Outcome: Progressing   Problem: Clinical Measurements: Goal: Ability to maintain clinical measurements within normal limits will improve Outcome: Progressing Goal: Will remain free from infection Outcome: Progressing Goal: Diagnostic test results will improve Outcome: Progressing Goal: Respiratory complications will improve Outcome: Progressing Goal: Cardiovascular complication will be avoided Outcome: Progressing

## 2020-07-25 NOTE — Progress Notes (Signed)
Pt has done IS previously. She understands the process and desired frequency

## 2020-07-25 NOTE — Evaluation (Addendum)
Physical Therapy Evaluation Patient Details Name: Patty Leon MRN: [id number] DOB: [birth year] Today's Date: 07/25/2020   History of Present Illness  Pt is a 60 y/o F admitted on 07/24/20 with c/c of chest pain & SOB. Pt being treated for severe COPD with acute exacerbation.  PMH: severe COPD on 3L O2, morbid obesity, OSA on nighttime bipap, HTN, DM, hx of DVT on xarelto, anxiety, arthritis, GERD, panic attacks  Clinical Impression  Pt seen for PT evaluation with pt ambulating around bed to recliner without assistance, managing O2 tubing independently. Pt is able to ambulate 1 lap around nurses station independently without AD while on 3L/min via nasal cannula. After returning to room to sit & rest pt with O2 desaturation to 77% on 3L/min requiring cuing for pursed lip breathing & returns to 91% within ~75 seconds. Max HR of 124 bpm after gait, down to 117 bpm with rest. At this time, pt is independent with mobility & does not demonstrate any acute PT needs. Encouraged pt to ambulate laps in room, or ask for nursing assistance to manage O2 tank to ambulate in hallway while in hospital. Pt may benefit from pulmonary rehab f/u but has been discussing this with her MD. Will complete current PT orders, please re-consult should new needs arise.   MD cleared pt for participation in setting of low K+ (3.1).    Follow Up Recommendations  (pulmonary rehab)    Equipment Recommendations  None recommended by PT    Recommendations for Other Services       Precautions / Restrictions Precautions Precautions: None Restrictions Weight Bearing Restrictions: No      Mobility  Bed Mobility               General bed mobility comments: not observed, pt received sitting on EOB & left in recliner    Transfers Overall transfer level: Independent                  Ambulation/Gait Ambulation/Gait assistance: Independent Gait Distance (Feet): 170 Feet Assistive device: None Gait  Pattern/deviations: WFL(Within Functional Limits) Gait velocity: WNL      Stairs            Wheelchair Mobility    Modified Rankin (Stroke Patients Only)       Balance Overall balance assessment: Independent                                           Pertinent Vitals/Pain Pain Assessment: 0-10 Pain Score: 7  Pain Location: back Pain Descriptors / Indicators: Aching;Sore Pain Intervention(s): Premedicated before session    Patty Leon expects to be discharged to:: Private residence Living Arrangements: Alone Available Help at Discharge: Available PRN/intermittently;Friend(s);Family Type of Home: Mobile home Home Access: Ramped entrance     Home Layout: One level Home Equipment: [place] - 2 wheels;Cane - single point      Prior Function Level of Independence: Independent         Comments: PRN use of SPC, drives     Hand Dominance   Dominant Hand: Right    Extremity/Trunk Assessment   Upper Extremity Assessment Upper Extremity Assessment: Overall WFL for tasks assessed    Lower Extremity Assessment Lower Extremity Assessment: Overall WFL for tasks assessed       Communication   Communication: No difficulties  Cognition Arousal/Alertness: Awake/alert Behavior During Therapy: WFL for  tasks assessed/performed Overall Cognitive Status: Within Functional Limits for tasks assessed                                        General Comments      Exercises     Assessment/Plan    PT Assessment Patent does not need any further PT services  PT Problem List         PT Treatment Interventions      PT Goals (Current goals can be found in the Care Plan section)  Acute Rehab PT Goals Patient Stated Goal: get an aide for housekeeping assist at home PT Goal Formulation: With patient Time For Goal Achievement: 08/08/20 Potential to Achieve Goals: Good    Frequency     Barriers to discharge         Co-evaluation               AM-PAC PT "6 Clicks" Mobility  Outcome Measure Help needed turning from your back to your side while in a flat bed without using bedrails?: None Help needed moving from lying on your back to sitting on the side of a flat bed without using bedrails?: None Help needed moving to and from a bed to a chair (including a wheelchair)?: None Help needed standing up from a chair using your arms (e.g., wheelchair or bedside chair)?: None Help needed to walk in hospital room?: None Help needed climbing 3-5 steps with a railing? : None 6 Click Score: 24    End of Session Equipment Utilized During Treatment: Oxygen Activity Tolerance: Patient tolerated treatment well Patient left: in chair;with call bell/phone within reach Nurse Communication: Mobility status      Time: 6759-1638 PT Time Calculation (min) (ACUTE ONLY): 13 min   Charges:   PT Evaluation $PT Eval Low Complexity: 1 Low          Lavone Nian, PT, DPT 07/25/20, 10:06 AM   Waunita Schooner 07/25/2020, 10:02 AM

## 2020-07-26 LAB — CBC WITH DIFFERENTIAL/PLATELET
Abs Immature Granulocytes: 0.05 10*3/uL (ref 0.00–0.07)
Basophils Absolute: 0 10*3/uL (ref 0.0–0.1)
Basophils Relative: 0 %
Eosinophils Absolute: 0 10*3/uL (ref 0.0–0.5)
Eosinophils Relative: 0 %
HCT: 46.6 % — ABNORMAL HIGH (ref 36.0–46.0)
Hemoglobin: 14.8 g/dL (ref 12.0–15.0)
Immature Granulocytes: 0 %
Lymphocytes Relative: 6 %
Lymphs Abs: 0.8 10*3/uL (ref 0.7–4.0)
MCH: 29.9 pg (ref 26.0–34.0)
MCHC: 31.8 g/dL (ref 30.0–36.0)
MCV: 94.1 fL (ref 80.0–100.0)
Monocytes Absolute: 0.5 10*3/uL (ref 0.1–1.0)
Monocytes Relative: 4 %
Neutro Abs: 11.6 10*3/uL — ABNORMAL HIGH (ref 1.7–7.7)
Neutrophils Relative %: 90 %
Platelets: 282 10*3/uL (ref 150–400)
RBC: 4.95 MIL/uL (ref 3.87–5.11)
RDW: 13.4 % (ref 11.5–15.5)
WBC: 13 10*3/uL — ABNORMAL HIGH (ref 4.0–10.5)
nRBC: 0 % (ref 0.0–0.2)

## 2020-07-26 LAB — COMPREHENSIVE METABOLIC PANEL
ALT: 11 U/L (ref 0–44)
AST: 12 U/L — ABNORMAL LOW (ref 15–41)
Albumin: 3.3 g/dL — ABNORMAL LOW (ref 3.5–5.0)
Alkaline Phosphatase: 80 U/L (ref 38–126)
Anion gap: 10 (ref 5–15)
BUN: 13 mg/dL (ref 6–20)
CO2: 37 mmol/L — ABNORMAL HIGH (ref 22–32)
Calcium: 9.7 mg/dL (ref 8.9–10.3)
Chloride: 94 mmol/L — ABNORMAL LOW (ref 98–111)
Creatinine, Ser: 0.57 mg/dL (ref 0.44–1.00)
GFR, Estimated: >60 mL/min (ref >60)
Glucose, Bld: 211 mg/dL — ABNORMAL HIGH (ref 70–99)
Potassium: 3.9 mmol/L (ref 3.5–5.1)
Sodium: 141 mmol/L (ref 135–145)
Total Bilirubin: 0.3 mg/dL (ref 0.3–1.2)
Total Protein: 7.9 g/dL (ref 6.5–8.1)

## 2020-07-26 LAB — GLUCOSE, CAPILLARY
Glucose-Capillary: 232 mg/dL — ABNORMAL HIGH (ref 70–99)
Glucose-Capillary: 332 mg/dL — ABNORMAL HIGH (ref 70–99)
Glucose-Capillary: 339 mg/dL — ABNORMAL HIGH (ref 70–99)
Glucose-Capillary: 302 mg/dL — ABNORMAL HIGH (ref 70–99)
Glucose-Capillary: 305 mg/dL — ABNORMAL HIGH (ref 70–99)

## 2020-07-26 MED ORDER — INSULIN ASPART 100 UNIT/ML IJ SOLN
3.0000 [IU] | Freq: Three times a day (TID) | INTRAMUSCULAR | Status: DC
  Administered 2020-07-26 – 2020-07-28 (×6): 3 [IU] via SUBCUTANEOUS
  Filled 2020-07-26 (×6): qty 1

## 2020-07-26 MED ORDER — ROFLUMILAST 500 MCG PO TABS
500.0000 ug | ORAL_TABLET | ORAL | Status: DC
  Administered 2020-07-26 – 2020-07-28 (×3): 500 ug via ORAL
  Filled 2020-07-26 (×3): qty 1

## 2020-07-26 MED ORDER — HYDRALAZINE HCL 20 MG/ML IJ SOLN: 5.0000 mg | Freq: Four times a day (QID) | INTRAMUSCULAR | Status: DC | PRN

## 2020-07-26 MED ORDER — HYDROCHLOROTHIAZIDE 12.5 MG PO CAPS
12.5000 mg | ORAL_CAPSULE | Freq: Every day | ORAL | Status: DC
  Administered 2020-07-26 – 2020-07-28 (×3): 12.5 mg via ORAL
  Filled 2020-07-26 (×3): qty 1

## 2020-07-26 MED ORDER — AMLODIPINE BESYLATE 5 MG PO TABS
5.0000 mg | ORAL_TABLET | Freq: Every day | ORAL | Status: DC
  Administered 2020-07-26 – 2020-07-28 (×3): 5 mg via ORAL
  Filled 2020-07-26 (×3): qty 1

## 2020-07-26 MED ORDER — LEVOFLOXACIN 750 MG PO TABS
750.0000 mg | ORAL_TABLET | Freq: Every day | ORAL | Status: DC
  Administered 2020-07-27 – 2020-07-28 (×2): 750 mg via ORAL
  Filled 2020-07-26 (×2): qty 1

## 2020-07-26 MED ORDER — GUAIFENESIN ER 600 MG PO TB12
1200.0000 mg | ORAL_TABLET | Freq: Two times a day (BID) | ORAL | Status: DC
  Administered 2020-07-26 – 2020-07-28 (×5): 1200 mg via ORAL
  Filled 2020-07-26 (×5): qty 2

## 2020-07-26 MED ORDER — METHYLPREDNISOLONE SODIUM SUCC 40 MG IJ SOLR
40.0000 mg | Freq: Two times a day (BID) | INTRAMUSCULAR | Status: DC
  Administered 2020-07-26 – 2020-07-27 (×3): 40 mg via INTRAVENOUS
  Filled 2020-07-26 (×3): qty 1

## 2020-07-26 MED ORDER — CYCLOBENZAPRINE HCL 10 MG PO TABS
5.0000 mg | ORAL_TABLET | Freq: Three times a day (TID) | ORAL | Status: DC
  Administered 2020-07-26 – 2020-07-28 (×6): 5 mg via ORAL
  Filled 2020-07-26 (×6): qty 1

## 2020-07-26 MED ORDER — IPRATROPIUM-ALBUTEROL 0.5-2.5 (3) MG/3ML IN SOLN
3.0000 mL | Freq: Three times a day (TID) | RESPIRATORY_TRACT | Status: DC
  Administered 2020-07-26 – 2020-07-28 (×7): 3 mL via RESPIRATORY_TRACT
  Filled 2020-07-26 (×7): qty 3

## 2020-07-26 MED ORDER — PANTOPRAZOLE SODIUM 40 MG PO TBEC
40.0000 mg | DELAYED_RELEASE_TABLET | Freq: Every day | ORAL | Status: DC
  Administered 2020-07-26 – 2020-07-28 (×3): 40 mg via ORAL
  Filled 2020-07-26 (×3): qty 1

## 2020-07-26 NOTE — Progress Notes (Signed)
PROGRESS NOTE   Patty Leon  JJO:[id number] DOB: [birth year] DOA: 07/24/2020 PCP: Danelle Berry, NP  Brief Narrative:  60 year old female  severe COPD-still smoker-she follows chronically with Dr. 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 HTN OSA BiPAP DVT on Xarelto  baseline is she is able to walk around at times but needs to take frequent breaks and this has been going on for the past several months to may be years-she uses oxygen intermittently ongoing out of the house and takes a portable oxygen with her For the past 3 weeks that she has been using oxygen daily even around the house  Last admitted 3/24 through 06/05/2020 COPD exacerbation requiring IV steroids increasing oxygen  Dimer 0.61 INR 1.0 flu and COVID-negative Potassium 3.1 BUNs/creatinine 10/0.5 troponin trend flat at 6 WBC 11.3 hemoglobin 15  Hospital-Problem based course Severe COPD followed by Dr. Raul Del Feeling more short of breath therefore transitioned back from prednisone to Solu-Medrol twice a day Continue ceftriaxone-narrow to orals in the next several days Ambulate out of bed whe able--FLutter valve every 2 hourly as tolerated Courtesy informed Dr. Raul Del of patient's admission Chronic hypoxic respiratory failure on home oxygen for several years Underlying OSA on BiPAP Intermittently uses oxygen at rest at home and cannot ambulate far Probably will need outpatient spirometry? Resumed Daliresp Has restrictive component to her chronic respiratory failure Will follow-up in the outpatient setting with pulmonologist DM TY 2 Continue sliding scale coverage continue CBGs checks 157-283 this morning Continue metformin 500 twice daily, continue exenatide 2 mg Adding mealtime insulin 3 units with meals scheduled HTN Resume losartan, HCTZ, other meds DVT on Xarelto Continue Xarelto at this time D-dimer was elevated on admission but she is not having any  hypoxia any pleurisy any chest pain and I do not think she has a PE given her findings BMI 52 Life-threatening-needs outpatient weight loss counseling-bariatric surgery? Hypokalemia Improved with replacement  DVT prophylaxis: Xarelto Code Status: Full Family Communication: None Disposition:  Status is: Observation  The patient will require care spanning > 2 midnights and should be moved to inpatient because: Hemodynamically unstable, Persistent severe electrolyte disturbances, Ongoing active pain requiring inpatient pain management and Unsafe d/c plan  Dispo: The patient is from: Home              Anticipated d/c is to: Home              Patient currently is not medically stable to d/c.   Difficult to place patient No       Consultants:   None currently  Procedures: X-rays   Antimicrobials: Ceftriaxone   Subjective:  Awake coherent in nad no focal deficit She states she is coughing more and spitting up-tells me she was taken off her PPI in the outpatient setting (we resume this today) Moving around some in room but not completely close to baseline No chest pain no fever no chills no nausea no vomiting Some swelling in extremities We discussed cutting back her dose of HCTZ and I resume some of her blood pressure meds  Objective: Vitals:   07/25/20 2012 07/25/20 2336 07/26/20 0413 07/26/20 0832  BP:  114/74 137/81 111/71  Pulse:  78 80 92  Resp:  15 16 18   Temp:  98.2 F (36.8 C) 98.1 F (36.7 C) 98.1 F (36.7 C)  TempSrc:      SpO2: 95% 100% 95% 91%  Weight:  Height:        Intake/Output Summary (Last 24 hours) at 07/26/2020 0946 Last data filed at 07/25/2020 1848 Gross per 24 hour  Intake 480 ml  Output --  Net 480 ml   Filed Weights   07/24/20 1730 07/25/20 0000  Weight: 124.7 kg 129.9 kg    Examination:  Awake coherent pleasant phonating well Mallampati 4 Chest clear no rales no rhonchi Abdomen soft no rebound no guarding obese poor  exam Trace lower extremity edema Mild wheeze No icterus no pallor Data Reviewed: personally reviewed   CBC    Component Value Date/Time   WBC 13.0 (H) 07/26/2020 0406   RBC 4.95 07/26/2020 0406   HGB 14.8 07/26/2020 0406   HGB 14.3 06/11/2014 2212   HCT 46.6 (H) 07/26/2020 0406   HCT 43.5 06/11/2014 2212   PLT 282 07/26/2020 0406   PLT 258 06/11/2014 2212   MCV 94.1 07/26/2020 0406   MCV 91 06/11/2014 2212   MCH 29.9 07/26/2020 0406   MCHC 31.8 07/26/2020 0406   RDW 13.4 07/26/2020 0406   RDW 14.0 06/11/2014 2212   LYMPHSABS 0.8 07/26/2020 0406   LYMPHSABS 0.6 (L) 11/17/2013 0318   MONOABS 0.5 07/26/2020 0406   MONOABS 0.4 11/17/2013 0318   EOSABS 0.0 07/26/2020 0406   EOSABS 0.0 11/17/2013 0318   BASOSABS 0.0 07/26/2020 0406   BASOSABS 0.0 11/17/2013 0318   CMP Latest Ref Rng & Units 07/26/2020 07/25/2020 07/24/2020  Glucose 70 - 99 mg/dL 211(H) 350(H) 123(H)  BUN 6 - 20 mg/dL 13 11 10   Creatinine 0.44 - 1.00 mg/dL 0.57 0.62 0.57  Sodium 135 - 145 mmol/L 141 136 140  Potassium 3.5 - 5.1 mmol/L 3.9 3.5 3.1(L)  Chloride 98 - 111 mmol/L 94(L) 92(L) 94(L)  CO2 22 - 32 mmol/L 37(H) 33(H) 35(H)  Calcium 8.9 - 10.3 mg/dL 9.7 9.6 9.4  Total Protein 6.5 - 8.1 g/dL 7.9 7.9 -  Total Bilirubin 0.3 - 1.2 mg/dL 0.3 0.4 -  Alkaline Phos 38 - 126 U/L 80 81 -  AST 15 - 41 U/L 12(L) 21 -  ALT 0 - 44 U/L 11 12 -     Radiology Studies: DG Chest 2 View  Result Date: 07/24/2020 CLINICAL DATA:  Chest pain.  Current smoker. EXAM: CHEST - 2 VIEW COMPARISON:  Chest radiograph 06/03/2020 FINDINGS: Stable cardiomediastinal contours. There are chronic coarse bilateral interstitial opacities, likely relating to smoking history. No new focal consolidation. No pneumothorax or large pleural effusion. No acute finding in the visualized skeleton. IMPRESSION: Chronic diffuse bilateral interstitial opacities, likely chronic bronchitic change, difficult to exclude superimposed acute interstitial process.  No new focal consolidation. Electronically Signed   By: Audie Pinto M.D.   On: 07/24/2020 18:24     Scheduled Meds: . fluticasone furoate-vilanterol  1 puff Inhalation Daily   And  . umeclidinium bromide  1 puff Inhalation Daily  . guaiFENesin  1,200 mg Oral BID  . insulin aspart  0-20 Units Subcutaneous TID WC  . insulin aspart  0-5 Units Subcutaneous QHS  . ipratropium-albuterol  3 mL Nebulization TID  . losartan  12.5 mg Oral Daily  . metFORMIN  500 mg Oral BID WC  . methylPREDNISolone (SOLU-MEDROL) injection  40 mg Intravenous Q12H  . metoprolol tartrate  50 mg Oral BH-q7a  . pneumococcal 23 valent vaccine  0.5 mL Intramuscular Tomorrow-1000  . potassium chloride  40 mEq Oral Daily  . rivaroxaban  20 mg Oral Daily   Continuous Infusions: .  cefTRIAXone (ROCEPHIN)  IV Stopped (07/26/20 0609)     LOS: 1 day   Time spent: Wade, MD [facility] To contact the attending provider between 7A-7P or the covering provider during after hours 7P-7A, please log into the web site [email or web address] and access using universal [place] password for that web site. If you do not have the password, please call the hospital operator.  07/26/2020, 9:46 AM

## 2020-07-26 NOTE — Consult Note (Signed)
Pulmonary Medicine          Date: 07/26/2020,   MRN# [id number] Patty Leon 1960-06-07     AdmissionWeight: 124.7 kg                 CurrentWeight: 129.9 kg      CHIEF COMPLAINT:  Copd exacerbation  HISTORY OF PRESENT ILLNESS  This is a 60 yr old lady well known to me, comes in with lower back pain and copd exacerbation. She seem to think that her upper airway is still tight. I discussed her care as being rendered and  d/c'ed plans(1-2 days). No fever, angina, edema or leg pain. The back is lower thoracic spine. She also had vague mid chest pain. No tearing sensation.   PAST MEDICAL HISTORY   Past Medical History:  Diagnosis Date  . Anxiety   . Arthritis   . COPD (chronic obstructive pulmonary disease) ([place])   . Diabetes mellitus without complication ([place])   . Diverticulosis   . Fluid retention   . GERD (gastroesophageal reflux disease)   . History of blood clots   . Hypercholesteremia   . Hypertension   . Obesity   . Panic attacks   . Shortness of breath dyspnea   . Sleep apnea    Use C-PAP with oxygen     SURGICAL HISTORY   Past Surgical History:  Procedure Laterality Date  . BILATERAL CARPAL TUNNEL RELEASE    . COLONOSCOPY    . COLONOSCOPY N/A 01/22/2015   Procedure: COLONOSCOPY;  Surgeon: Josefine Class, MD;  Location[facility] ENDOSCOPY;  Service: Endoscopy;  Laterality: N/A;  . COLONOSCOPY WITH PROPOFOL N/A 07/12/2016   Procedure: COLONOSCOPY WITH PROPOFOL;  Surgeon: Manya Silvas, MD;  Location: [facility] ENDOSCOPY;  Service: Endoscopy;  Laterality: N/A;  . COLONOSCOPY WITH PROPOFOL N/A 04/26/2018   Procedure: COLONOSCOPY WITH PROPOFOL;  Surgeon: Manya Silvas, MD;  Location: [facility] ENDOSCOPY;  Service: Endoscopy;  Laterality: N/A;  . ESOPHAGOGASTRODUODENOSCOPY (EGD) WITH PROPOFOL N/A 04/26/2018   Procedure: ESOPHAGOGASTRODUODENOSCOPY (EGD) WITH PROPOFOL;  Surgeon: Manya Silvas, MD;  Location: [facility] ENDOSCOPY;  Service: Endoscopy;   Laterality: N/A;  . JOINT REPLACEMENT Left 09/2015  . KNEE ARTHROSCOPY Left   . KNEE CLOSED REDUCTION Left 11/04/2015   Procedure: CLOSED MANIPULATION KNEE;  Surgeon: Hessie Knows, MD;  Location: [facility] ORS;  Service: Orthopedics;  Laterality: Left;  . KNEE CLOSED REDUCTION Left 04/04/2016   Procedure: CLOSED MANIPULATION KNEE;  Surgeon: Hessie Knows, MD;  Location: [facility] ORS;  Service: Orthopedics;  Laterality: Left;  . SHOULDER ARTHROSCOPY W/ ROTATOR CUFF REPAIR Right   . TOTAL KNEE ARTHROPLASTY Left 10/07/2015   Procedure: TOTAL KNEE ARTHROPLASTY;  Surgeon: Hessie Knows, MD;  Location: [facility] ORS;  Service: Orthopedics;  Laterality: Left;  . TOTAL KNEE REVISION Left 02/08/2016   Procedure: TOTAL KNEE REVISION;  Surgeon: Hessie Knows, MD;  Location: [facility] ORS;  Service: Orthopedics;  Laterality: Left;  . TUBAL LIGATION       FAMILY HISTORY   Family History  Problem Relation Age of Onset  . Lung cancer Mother   . Ovarian cancer Paternal Grandmother   . Breast cancer Neg Hx      SOCIAL HISTORY   Social History   Tobacco Use  . Smoking status: Current Every Day Smoker    Packs/day: 0.50    Years: 30.00    Pack years: 15.00    Types: Cigarettes  . Smokeless tobacco: Never Used  Vaping Use  .  Vaping Use: Never used  Substance Use Topics  . Alcohol use: Not Currently  . Drug use: Not Currently    Types: Oxycodone    Comment: as prescribed by MD     MEDICATIONS    Home Medication:    Current Medication:  Current Facility-Administered Medications:  .  albuterol (PROVENTIL) (2.5 MG/3ML) 0.083% nebulizer solution 2.5 mg, 2.5 mg, Nebulization, Q2H PRN, Judd Gaudier V, MD .  cefTRIAXone (ROCEPHIN) 1 g in sodium chloride 0.9 % 100 mL IVPB, 1 g, Intravenous, Q24H, Nita Sells, MD, Stopped at 07/26/20 [phone] .  cyclobenzaprine (FLEXERIL) tablet 5 mg, 5 mg, Oral, TID, Samtani, Jai-Gurmukh, MD, 5 mg at 07/26/20 1600 .  fluticasone furoate-vilanterol (BREO ELLIPTA) 100-25  MCG/INH 1 puff, 1 puff, Inhalation, Daily, 1 puff at 07/26/20 0857 **AND** umeclidinium bromide (INCRUSE ELLIPTA) 62.5 MCG/INH 1 puff, 1 puff, Inhalation, Daily, Nita Sells, MD, 1 puff at 07/26/20 0858 .  guaiFENesin (MUCINEX) 12 hr tablet 1,200 mg, 1,200 mg, Oral, BID, Verlon Au, Jai-Gurmukh, MD, 1,200 mg at 07/26/20 0950 .  hydrochlorothiazide (MICROZIDE) capsule 12.5 mg, 12.5 mg, Oral, Daily, Samtani, Jai-Gurmukh, MD, 12.5 mg at 07/26/20 1259 .  insulin aspart (novoLOG) injection 0-20 Units, 0-20 Units, Subcutaneous, TID WC, Athena Masse, MD, 15 Units at 07/26/20 1258 .  insulin aspart (novoLOG) injection 0-5 Units, 0-5 Units, Subcutaneous, QHS, Athena Masse, MD, 3 Units at 07/25/20 2203 .  insulin aspart (novoLOG) injection 3 Units, 3 Units, Subcutaneous, TID WC, Nita Sells, MD, 3 Units at 07/26/20 1258 .  ipratropium-albuterol (DUONEB) 0.5-2.5 (3) MG/3ML nebulizer solution 3 mL, 3 mL, Nebulization, TID, Samtani, Jai-Gurmukh, MD, 3 mL at 07/26/20 1322 .  [START ON 07/27/2020] levofloxacin (LEVAQUIN) tablet 750 mg, 750 mg, Oral, Daily, Samtani, Jai-Gurmukh, MD .  losartan (COZAAR) tablet 12.5 mg, 12.5 mg, Oral, Daily, Samtani, Jai-Gurmukh, MD, 12.5 mg at 07/26/20 0951 .  metFORMIN (GLUCOPHAGE) tablet 500 mg, 500 mg, Oral, BID WC, Samtani, Jai-Gurmukh, MD, 500 mg at 07/26/20 0952 .  methylPREDNISolone sodium succinate (SOLU-MEDROL) 40 mg/mL injection 40 mg, 40 mg, Intravenous, Q12H, Samtani, Jai-Gurmukh, MD, 40 mg at 07/26/20 0953 .  metoprolol tartrate (LOPRESSOR) tablet 50 mg, 50 mg, Oral, Reina Fuse, MD, 50 mg at 07/26/20 0644 .  oxyCODONE (Oxy IR/ROXICODONE) immediate release tablet 5 mg, 5 mg, Oral, TID PRN, Athena Masse, MD, 5 mg at 07/26/20 0355 .  pantoprazole (PROTONIX) EC tablet 40 mg, 40 mg, Oral, Daily, Samtani, Jai-Gurmukh, MD, 40 mg at 07/26/20 1259 .  pneumococcal 23 valent vaccine (PNEUMOVAX-23) injection 0.5 mL, 0.5 mL, Intramuscular,  Tomorrow-1000, Duncan, Hazel V, MD .  polyethylene glycol powder (GLYCOLAX/MIRALAX) container 17 g, 17 g, Oral, Daily PRN, Samtani, Jai-Gurmukh, MD .  potassium chloride SA (KLOR-CON) CR tablet 40 mEq, 40 mEq, Oral, Daily, Verlon Au, Jai-Gurmukh, MD, 40 mEq at 07/26/20 0950 .  rivaroxaban (XARELTO) tablet 20 mg, 20 mg, Oral, Daily, Judd Gaudier V, MD, 20 mg at 07/26/20 0952 .  roflumilast (DALIRESP) tablet 500 mcg, 500 mcg, Oral, Rodney Booze, Nita Sells, MD, 500 mcg at 07/26/20 1259    ALLERGIES   Celebrex [celecoxib] and Dilaudid [hydromorphone]     REVIEW OF SYSTEMS    Review of Systems:  Gen:  Denies  fever, sweats, chills weigh loss  HEENT: Denies blurred vision, double vision, ear pain, eye pain, hearing loss, nose bleeds, sore throat Cardiac:  No dizziness, chest pain or heaviness, chest tightness,edema Resp:   + cough or sputum porduction, + shortness of breath, occasional wheezing,  hemoptysis,  Gi: Denies swallowing difficulty, stomach pain, nausea or vomiting, diarrhea, constipation, bowel incontinence Gu:  Denies bladder incontinence, burning urine Ext:   Denies Joint pain, stiffness or swelling Skin: Denies  skin rash, easy bruising or bleeding or hives Endoc:  Denies polyuria, polydipsia , polyphagia or weight change Psych:   Denies depression, insomnia or hallucinations   Other:  All other systems negative   VS: BP (!) 156/122 (BP Location: Left Arm)   Pulse 96   Temp 98.1 F (36.7 C)   Resp 18   Ht 5\' 2"  (1.575 m)   Wt 129.9 kg   LMP 04/24/2014 (Approximate)   SpO2 100%   BMI 52.38 kg/m      PHYSICAL EXAM    GENERAL:NAD, no fevers, chills, no weakness no fatigue, sitting in chair with family and friend around her HEAD: Normocephalic, atraumatic.  EYES: Pupils equal, round, reactive to light. Extraocular muscles intact. No scleral icterus.  MOUTH: Moist mucosal membrane. Dentition intact. No abscess noted.  EAR, NOSE, THROAT: Clear without  exudates. No external lesions.  NECK: Supple. No thyromegaly. No nodules. No JVD.  PULMONARY:  +rare wheezes on forced expiration CARDIOVASCULAR: S1 and S2. Regular rate and rhythm. No murmurs, rubs, or gallops. No edema. Pedal pulses 2+ bilaterally.  GASTROINTESTINAL: Soft, nontender, nondistended. No masses. Positive bowel sounds. No hepatosplenomegaly.  MUSCULOSKELETAL: No swelling, clubbing, or edema. Range of motion full in all extremities.  NEUROLOGIC: Cranial nerves II through XII are intact. No gross focal neurological deficits. Sensation intact. Reflexes intact.  SKIN: No ulceration, lesions, rashes, or cyanosis. Skin warm and dry. Turgor intact.  PSYCHIATRIC: Mood, affect within normal limits. The patient is awake, alert and oriented x 3. Insight, judgment intact.  PULSES: Radial pulses are equal       IMAGING    DG Chest 2 View  Result Date: 07/24/2020 CLINICAL DATA:  Chest pain.  Current smoker. EXAM: CHEST - 2 VIEW COMPARISON:  Chest radiograph 06/03/2020 FINDINGS: Stable cardiomediastinal contours. There are chronic coarse bilateral interstitial opacities, likely relating to smoking history. No new focal consolidation. No pneumothorax or large pleural effusion. No acute finding in the visualized skeleton. IMPRESSION: Chronic diffuse bilateral interstitial opacities, likely chronic bronchitic change, difficult to exclude superimposed acute interstitial process. No new focal consolidation. Electronically Signed   By: Audie Pinto M.D.   On: 07/24/2020 18:24      ASSESSMENT/PLAN   Her hx and presentation is c/w copd exacerbation, she is on approriate regimen. She is getting bettn slowly. Suspect tomorrow we can switch iv meds to po meds and move towards d/c with out patient fu in pulm in one week. Patient is aware of the plans.      Thank you for allowing me to participate in the care of this patient.   Patient/Family are satisfied with care plan and all questions have  been answered.  This document was prepared using Dragon voice recognition software and may include unintentional dictation errors.     Wallene Huh, M.D.  Division of [facility]

## 2020-07-26 NOTE — Progress Notes (Signed)
Inpatient Diabetes Program Recommendations  AACE/ADA: New Consensus Statement on Inpatient Glycemic Control (2015)  Target Ranges:  Prepandial:   less than 140 mg/dL      Peak postprandial:   less than 180 mg/dL (1-2 hours)      Critically ill patients:  140 - 180 mg/dL   Lab Results  Component Value Date   GLUCAP 332 (H) 07/26/2020   HGBA1C 7.0 (H) 06/04/2020    Review of Glycemic Control Results for Patty Leon, Patty Leon (MRN [id number]) as of 07/26/2020 09:05  Ref. Range 07/25/2020 16:19 07/25/2020 21:33 07/26/2020 04:20 07/26/2020 08:30  Glucose-Capillary Latest Ref Range: 70 - 99 mg/dL 267 (H) 257 (H) 232 (H) 332 (H)   Diabetes history: DM 2 Outpatient Diabetes medications:  Metformin 500 mg bid, Jardiance 25 mg daily Current orders for Inpatient glycemic control:  Novolog resistant tid with meals and HS Prednisone 40 mg daily Metformin 500 mg bid  Inpatient Diabetes Program Recommendations:   Note steroids tapering. While on prednisone, consider adding Novolog meal coverage 5 units tid with meals.    Thanks,  Adah Perl, RN, BC-ADM Inpatient Diabetes Coordinator Pager [phone] (8a-5p)

## 2020-07-26 NOTE — Progress Notes (Signed)
Nutrition Brief Note  New MD consult received for evaluation of nutrition needs.   Pt resting in bedside chair at the time of assessment. Pt reports good appetite this admission. States she is consuming her meals and is also asking nursing staff for snacks if she gets hungry between. States at home she snacks late into the night so she has been getting hungry in the evenings here. No weight loss noted and no signs of muscle/fat depletions. Pt reports that her breathing is better today but requested that MD keep her on IV steroids 1 more day before switching to oral. Hopeful to be discharged on Wednesday.  Wt Readings from Last 15 Encounters:  07/25/20 129.9 kg  06/04/20 126.1 kg  12/13/18 129.3 kg  04/26/18 126.6 kg  10/15/17 129.3 kg  04/23/17 124.7 kg  10/11/16 127 kg  07/12/16 120.2 kg  07/12/16 120.2 kg  04/04/16 120.2 kg  03/27/16 120.2 kg  02/08/16 123.3 kg  01/25/16 117.9 kg  11/04/15 120.2 kg  10/07/15 120.2 kg    Body mass index is 52.38 kg/m. Patient meets criteria for morbid obesity based on current BMI.   Current diet order is heart healthy/carb modified, patient is consuming approximately 100% of meals at this time. Labs and medications reviewed.   No nutrition interventions warranted at this time. If nutrition issues arise, please consult RD.   Patty Leon, RD, LDN Clinical Dietitian Pager on [place]

## 2020-07-27 LAB — GLUCOSE, CAPILLARY
Glucose-Capillary: 278 mg/dL — ABNORMAL HIGH (ref 70–99)
Glucose-Capillary: 286 mg/dL — ABNORMAL HIGH (ref 70–99)
Glucose-Capillary: 279 mg/dL — ABNORMAL HIGH (ref 70–99)
Glucose-Capillary: 322 mg/dL — ABNORMAL HIGH (ref 70–99)
Glucose-Capillary: 452 mg/dL — ABNORMAL HIGH (ref 70–99)

## 2020-07-27 MED ORDER — HYDROCHLOROTHIAZIDE 25 MG PO TABS: 12.5000 mg | ORAL_TABLET | Freq: Every day | ORAL | 2 refills | Status: DC

## 2020-07-27 MED ORDER — GUAIFENESIN ER 600 MG PO TB12: 1200.0000 mg | ORAL_TABLET | Freq: Two times a day (BID) | ORAL | 0 refills | Status: DC

## 2020-07-27 MED ORDER — PREDNISONE 20 MG PO TABS: 60.0000 mg | ORAL_TABLET | Freq: Every day | ORAL | 0 refills | Status: DC

## 2020-07-27 MED ORDER — LEVOFLOXACIN 750 MG PO TABS: 750.0000 mg | ORAL_TABLET | Freq: Every day | ORAL | 0 refills | Status: DC

## 2020-07-27 MED ORDER — POTASSIUM CHLORIDE CRYS ER 20 MEQ PO TBCR: 40.0000 meq | EXTENDED_RELEASE_TABLET | Freq: Every day | ORAL | 0 refills | Status: DC

## 2020-07-27 MED ORDER — CYCLOBENZAPRINE HCL 10 MG PO TABS
5.0000 mg | ORAL_TABLET | Freq: Three times a day (TID) | ORAL | 0 refills | Status: DC
Start: 2020-07-27 — End: 2020-10-18

## 2020-07-27 MED ORDER — PREDNISONE 50 MG PO TABS
60.0000 mg | ORAL_TABLET | Freq: Every day | ORAL | Status: DC
  Administered 2020-07-27: 60 mg via ORAL
  Filled 2020-07-27: qty 1

## 2020-07-27 NOTE — Progress Notes (Signed)
Patient will discharge tomorrow-please check glycemic control prior to discharge and adjust metformin as needed  Verneita Griffes, MD [facility] 7:08 PM

## 2020-07-27 NOTE — Discharge Summary (Signed)
Physician Discharge Summary  Patty Leon OHY:[id number] DOB: Feb 27, 1961 DOA: 07/24/2020  PCP: Danelle Berry, NP  Admit date: 07/24/2020 Discharge date: 07/27/2020  Time spent: 22 minutes  Recommendations for Outpatient Follow-up:  1. See MAR for prednisone and Levaquin dosing 2. Outpatient spirometry/sleep apnea titration per Dr. Raul Del 3. Continue weight loss efforts in the outpatient setting  Discharge Diagnoses:  MAIN problem for hospitalization   AE COPD  Please see below for itemized issues addressed in HOpsital- refer to other progress notes for clarity if needed  Discharge Condition: Improved  Diet recommendation: Heart healthy  Filed Weights   07/24/20 1730 07/25/20 0000  Weight: 124.7 kg 129.9 kg    History of present illness:  60 year old female  severe COPD-still smoker-she follows chronically with Dr. 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 HTN OSA BiPAP DVT on Xarelto  baseline is she is able to walk around at times but needs to take frequent breaks and this has been going on for the past several months to may be years-she uses oxygen intermittently ongoing out of the house and takes a portable oxygen with her For the past 3 weeks that she has been using oxygen daily even around the house  Last admitted 3/24 through 06/05/2020 COPD exacerbation requiring IV steroids increasing oxygen  Dimer 0.61 INR 1.0 flu and COVID-negative Potassium 3.1 BUNs/creatinine 10/0.5 troponin trend flat at 6 WBC 11.3 hemoglobin 15   Hospital Course:  Severe COPD followed by Dr. Raul Del Switch to prednisone on 07/27/2020 60mg  for 5 days on discharge-it was elected to use a shorter dose given her significant hyperglycemia this hospital stay Ceftriaxone narrowed to p.o. Levaquin 5/17 Ambulate out of bed whe able--FLutter valve every 2 hourly as tolerated Outpatient follow-up Dr. Raul Del Chronic hypoxic respiratory  failure on home oxygen for several years Underlying OSA on BiPAP Resumed Daliresp Has restrictive component to her chronic respiratory failure Compliant in hospital with BiPAP DM TY 2 hyperglycemia worsening secondary to IV steroids CBG 2 78-3 39 Increase metformin 500-->1000 twice daily if still elevated above 300 in a.m. Continue exenatide 2 mg Expect glycemic control will improve with change to oral steroids Needs outpatient follow-up HTN Resume losartan, HCTZ, other meds DVT on Xarelto Continue Xarelto at this time D-dimer was elevated on admission-she does not have a PE clinically and has improved BMI 52 Counseled regarding outpatient weight loss counseling- Have given her resources including getting a hand bicycle in order to do home exercise with both upper and lower extremities Hypokalemia Improved with replacement-periodic labs   Dr. Raul Del pulmonary saw the patient   Discharge Exam: Vitals:   07/27/20 1246 07/27/20 1557  BP: 114/71 123/67  Pulse: 87 88  Resp: 17 17  Temp: 98.1 F (36.7 C) 97.7 F (36.5 C)  SpO2: 94% 94%    Subj on day of d/c   Awake coherent no distress doing fair breathing better Long discussion about diet exercise Explained expected hyperglycemia and it is noted that during hospital stay she has been drinking full sugar Pepsi  General Exam on discharge  See exam from earlier today EOMI NCAT Chest was fairly clear  Discharge Instructions   Discharge Instructions    Diet - low sodium heart healthy   Complete by: As directed    Discharge instructions   Complete by: As directed    Look at your medications carefully in particular your steroids and Levaquin dosing You will need  close follow-up with Dr. Raul Del in the outpatient setting We will continue your oxygen Please get labs in about 1 week at primary care physician or Dr. Gust Brooms office to check your potassium Best of luck with weight loss keep working hard   Increase  activity slowly   Complete by: As directed      Allergies as of 07/27/2020      Reactions   Celebrex [celecoxib] Swelling   Dilaudid [hydromorphone] Itching      Medication List    STOP taking these medications   Bydureon BCise 2 MG/0.85ML Auij Generic drug: Exenatide ER   docusate sodium 100 MG capsule Commonly known as: COLACE   Klor-Con 10 10 MEQ tablet Generic drug: potassium chloride     TAKE these medications   albuterol 108 (90 Base) MCG/ACT inhaler Commonly known as: VENTOLIN HFA Inhale 2 puffs into the lungs every 6 (six) hours as needed for wheezing.   amLODipine 5 MG tablet Commonly known as: NORVASC Take 5 mg by mouth every morning.   cetirizine 10 MG tablet Commonly known as: ZYRTEC 10 mg daily.   citalopram 10 MG tablet Commonly known as: CELEXA Take 10 mg by mouth at bedtime.   cyclobenzaprine 10 MG tablet Commonly known as: FLEXERIL Take 0.5 tablets (5 mg total) by mouth 3 (three) times daily.   ferrous sulfate 325 (65 FE) MG tablet Take by mouth.   guaiFENesin 600 MG 12 hr tablet Commonly known as: MUCINEX Take 2 tablets (1,200 mg total) by mouth 2 (two) times daily.   hydrochlorothiazide 25 MG tablet Commonly known as: HYDRODIURIL Take 0.5 tablets (12.5 mg total) by mouth daily. What changed: how much to take   Jardiance 25 MG Tabs tablet Generic drug: empagliflozin Take 25 mg by mouth every morning.   levofloxacin 750 MG tablet Commonly known as: LEVAQUIN Take 1 tablet (750 mg total) by mouth daily. Start taking on: Jul 28, 2020   losartan 25 MG tablet Commonly known as: COZAAR Take 25 mg by mouth daily.   metFORMIN 500 MG tablet Commonly known as: GLUCOPHAGE Take 500 mg by mouth 2 (two) times daily.   metoprolol tartrate 50 MG tablet Commonly known as: LOPRESSOR Take 50 mg by mouth every morning.   omeprazole 40 MG capsule Commonly known as: PRILOSEC Take 40 mg by mouth every morning.   oxyCODONE 5 MG immediate  release tablet Commonly known as: Oxy IR/ROXICODONE Take 5 mg by mouth 3 (three) times daily as needed.   OXYGEN Inhale into the lungs.   polyethylene glycol powder 17 GM/SCOOP powder Commonly known as: GLYCOLAX/MIRALAX Take 17 g by mouth daily as needed for mild constipation.   potassium chloride SA 20 MEQ tablet Commonly known as: KLOR-CON Take 2 tablets (40 mEq total) by mouth daily. Start taking on: Jul 28, 2020   predniSONE 20 MG tablet Commonly known as: DELTASONE Take 3 tablets (60 mg total) by mouth daily before breakfast. Start taking on: Jul 28, 2020   roflumilast 500 MCG Tabs tablet Commonly known as: DALIRESP Take 500 mcg by mouth every morning.   rosuvastatin 10 MG tablet Commonly known as: CRESTOR Take 10 mg by mouth every evening.   Trelegy Ellipta 100-62.5-25 MCG/INH Aepb Generic drug: Fluticasone-Umeclidin-Vilant Inhale into the lungs daily.   Vitamin D 50 MCG (2000 UT) Caps Take 2,000 Units by mouth daily.   Xarelto 20 MG Tabs tablet Generic drug: rivaroxaban Take 20 mg by mouth daily.      Allergies  Allergen  Reactions  . Celebrex [Celecoxib] Swelling  . Dilaudid [Hydromorphone] Itching      The results of significant diagnostics from this hospitalization (including imaging, microbiology, ancillary and laboratory) are listed below for reference.    Significant Diagnostic Studies: DG Chest 2 View  Result Date: 07/24/2020 CLINICAL DATA:  Chest pain.  Current smoker. EXAM: CHEST - 2 VIEW COMPARISON:  Chest radiograph 06/03/2020 FINDINGS: Stable cardiomediastinal contours. There are chronic coarse bilateral interstitial opacities, likely relating to smoking history. No new focal consolidation. No pneumothorax or large pleural effusion. No acute finding in the visualized skeleton. IMPRESSION: Chronic diffuse bilateral interstitial opacities, likely chronic bronchitic change, difficult to exclude superimposed acute interstitial process. No new focal  consolidation. Electronically Signed   By: Audie Pinto M.D.   On: 07/24/2020 18:24    Microbiology: Recent Results (from the past 240 hour(s))  Resp Panel by RT-PCR (Flu A&B, Covid) Nasopharyngeal Swab     Status: None   Collection Time: 07/24/20  7:13 PM   Specimen: Nasopharyngeal Swab; Nasopharyngeal(NP) swabs in vial transport medium  Result Value Ref Range Status   SARS Coronavirus 2 by RT PCR NEGATIVE NEGATIVE Final    Comment: (NOTE) SARS-CoV-2 target nucleic acids are NOT DETECTED.  The SARS-CoV-2 RNA is generally detectable in upper respiratory specimens during the acute phase of infection. The lowest concentration of SARS-CoV-2 viral copies this assay can detect is 138 copies/mL. A negative result does not preclude SARS-Cov-2 infection and should not be used as the sole basis for treatment or other patient management decisions. A negative result may occur with  improper specimen collection/handling, submission of specimen other than nasopharyngeal swab, presence of viral mutation(s) within the areas targeted by this assay, and inadequate number of viral copies(<138 copies/mL). A negative result must be combined with clinical observations, patient history, and epidemiological information. The expected result is Negative.  Fact Sheet for Patients:  EntrepreneurPulse.com.au  Fact Sheet for Healthcare Providers:  IncredibleEmployment.be  This test is no t yet approved or cleared by the [place] FDA and  has been authorized for detection and/or diagnosis of SARS-CoV-2 by FDA under an Emergency Use Authorization (EUA). This EUA will remain  in effect (meaning this test can be used) for the duration of the COVID-19 declaration under Section 564(b)(1) of the Act, 21 U.S.C.section 360bbb-3(b)(1), unless the authorization is terminated  or revoked sooner.       Influenza A by PCR NEGATIVE NEGATIVE Final   Influenza B by PCR  NEGATIVE NEGATIVE Final    Comment: (NOTE) The Xpert Xpress SARS-CoV-2/FLU/RSV plus assay is intended as an aid in the diagnosis of influenza from Nasopharyngeal swab specimens and should not be used as a sole basis for treatment. Nasal washings and aspirates are unacceptable for Xpert Xpress SARS-CoV-2/FLU/RSV testing.  Fact Sheet for Patients: EntrepreneurPulse.com.au  Fact Sheet for Healthcare Providers: IncredibleEmployment.be  This test is not yet approved or cleared by the [place] FDA and has been authorized for detection and/or diagnosis of SARS-CoV-2 by FDA under an Emergency Use Authorization (EUA). This EUA will remain in effect (meaning this test can be used) for the duration of the COVID-19 declaration under Section 564(b)(1) of the Act, 21 U.S.C. section 360bbb-3(b)(1), unless the authorization is terminated or revoked.  Performed at [facility], [address] [place], [place] [address]      Labs: Basic Metabolic Panel: Recent Labs  Lab 07/24/20 1743 07/25/20 1132 07/26/20 0406  NA 140 136 141  K 3.1* 3.5 3.9  CL 94* 92* 94*  CO2 35* 33* 37*  GLUCOSE 123* 350* 211*  BUN 10 11 13   CREATININE 0.57 0.62 0.57  CALCIUM 9.4 9.6 9.7  MG  --  2.2  --    Liver Function Tests: Recent Labs  Lab 07/25/20 1132 07/26/20 0406  AST 21 12*  ALT 12 11  ALKPHOS 81 80  BILITOT 0.4 0.3  PROT 7.9 7.9  ALBUMIN 3.4* 3.3*   No results for input(s): LIPASE, AMYLASE in the last 168 hours. No results for input(s): AMMONIA in the last 168 hours. CBC: Recent Labs  Lab 07/24/20 1743 07/26/20 0406  WBC 11.3* 13.0*  NEUTROABS  --  11.6*  HGB 15.3* 14.8  HCT 47.9* 46.6*  MCV 92.6 94.1  PLT 262 282   Cardiac Enzymes: No results for input(s): CKTOTAL, CKMB, CKMBINDEX, TROPONINI in the last 168 hours. BNP: BNP (last 3 results) No results for input(s): BNP in the last 8760 hours.  ProBNP (last 3 results) No  results for input(s): PROBNP in the last 8760 hours.  CBG: Recent Labs  Lab 07/26/20 2009 07/27/20 0725 07/27/20 1244 07/27/20 1401 07/27/20 1626  GLUCAP 305* 278* 286* 279* 322*       Signed:  Nita Sells MD   [facility] 07/27/2020, 7:05 PM

## 2020-07-27 NOTE — Progress Notes (Signed)
PROGRESS NOTE   Patty Leon  SEG:[id number] DOB: 08/13/1960 DOA: 07/24/2020 PCP: Danelle Berry, NP  Brief Narrative:  60 year old female  severe COPD-still smoker-she follows chronically with Dr. 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 HTN OSA BiPAP DVT on Xarelto  baseline is she is able to walk around at times but needs to take frequent breaks and this has been going on for the past several months to may be years-she uses oxygen intermittently ongoing out of the house and takes a portable oxygen with her For the past 3 weeks that she has been using oxygen daily even around the house  Last admitted 3/24 through 06/05/2020 COPD exacerbation requiring IV steroids increasing oxygen  Dimer 0.61 INR 1.0 flu and COVID-negative Potassium 3.1 BUNs/creatinine 10/0.5 troponin trend flat at 6 WBC 11.3 hemoglobin 15  Hospital-Problem based course Severe COPD followed by Dr. Raul Del Switch to prednisone on 07/27/2020 60 with expected prolonged taper on discharge Ceftriaxone narrowed to p.o. Levaquin 5/17 Ambulate out of bed whe able--FLutter valve every 2 hourly as tolerated Appreciate pulmonologist input-he will follow her up in the outpatient setting in the next week or so Chronic hypoxic respiratory failure on home oxygen for several years Underlying OSA on BiPAP Resumed Daliresp Has restrictive component to her chronic respiratory failure Compliant in hospital with BiPAP DM TY 2 hyperglycemia worsening secondary to IV steroids CBG 2 78-3 39 Increase metformin 500-->1000 twice daily if still elevated above 300 in a.m. Continue exenatide 2 mg For now continue insulin 3 units with meals scheduled Expect glycemic control will improve with change to oral steroids HTN Resume losartan, HCTZ, other meds DVT on Xarelto Continue Xarelto at this time D-dimer was elevated on admission-she does not have a PE clinically BMI 52 Counseled  regarding outpatient weight loss counseling- Have given her resources including getting a hand bicycle in order to do home exercise with both upper and lower extremities Hypokalemia Improved with replacement-periodic labs  DVT prophylaxis: Xarelto Code Status: Full Family Communication: None Disposition:  Status is: Observation  The patient will require care spanning > 2 midnights and should be moved to inpatient because: Hemodynamically unstable, Persistent severe electrolyte disturbances, Ongoing active pain requiring inpatient pain management and Unsafe d/c plan  Dispo: The patient is from: Home              Anticipated d/c is to: Home              Patient currently is not medically stable to d/c.   Difficult to place patient No       Consultants:   None currently  Procedures: X-rays   Antimicrobials: Ceftriaxone-->Levaquin 5/16   Subjective:  Doing well Coughing is improved She still has sputum despite use of PPI that was resumed on 5/16 Sputum however not visualized by me Chest is clear  and she is talking in full sentences   Objective: Vitals:   07/26/20 2034 07/26/20 2353 07/27/20 0333 07/27/20 0725  BP: 134/78 119/81 117/77 122/75  Pulse: (!) 110 87 84 86  Resp: 20 18 18 17   Temp: 98.2 F (36.8 C) 98.2 F (36.8 C) 97.9 F (36.6 C) 98 F (36.7 C)  TempSrc:    Oral  SpO2: 96% 94% 95% 100%  Weight:      Height:        Intake/Output Summary (Last 24 hours) at 07/27/2020 1014 Last data filed at 07/26/2020 1840 Gross per  24 hour  Intake 600 ml  Output --  Net 600 ml   Filed Weights   07/24/20 1730 07/25/20 0000  Weight: 124.7 kg 129.9 kg    Examination:   Coherent pleasant no distress Mallampati 4 smiling Talking in full sentences on oxygen 2 L Chest clear no rales no rhonchi Abdomen obese nontender no rebound No lower extremity edema Power 5/5 S1-S2 no murmur on monitors she seems to be in sinus rhythm Pleasant euthymic coherent  Data  Reviewed: personally reviewed   CBC    Component Value Date/Time   WBC 13.0 (H) 07/26/2020 0406   RBC 4.95 07/26/2020 0406   HGB 14.8 07/26/2020 0406   HGB 14.3 06/11/2014 2212   HCT 46.6 (H) 07/26/2020 0406   HCT 43.5 06/11/2014 2212   PLT 282 07/26/2020 0406   PLT 258 06/11/2014 2212   MCV 94.1 07/26/2020 0406   MCV 91 06/11/2014 2212   MCH 29.9 07/26/2020 0406   MCHC 31.8 07/26/2020 0406   RDW 13.4 07/26/2020 0406   RDW 14.0 06/11/2014 2212   LYMPHSABS 0.8 07/26/2020 0406   LYMPHSABS 0.6 (L) 11/17/2013 0318   MONOABS 0.5 07/26/2020 0406   MONOABS 0.4 11/17/2013 0318   EOSABS 0.0 07/26/2020 0406   EOSABS 0.0 11/17/2013 0318   BASOSABS 0.0 07/26/2020 0406   BASOSABS 0.0 11/17/2013 0318   CMP Latest Ref Rng & Units 07/26/2020 07/25/2020 07/24/2020  Glucose 70 - 99 mg/dL 211(H) 350(H) 123(H)  BUN 6 - 20 mg/dL 13 11 10   Creatinine 0.44 - 1.00 mg/dL 0.57 0.62 0.57  Sodium 135 - 145 mmol/L 141 136 140  Potassium 3.5 - 5.1 mmol/L 3.9 3.5 3.1(L)  Chloride 98 - 111 mmol/L 94(L) 92(L) 94(L)  CO2 22 - 32 mmol/L 37(H) 33(H) 35(H)  Calcium 8.9 - 10.3 mg/dL 9.7 9.6 9.4  Total Protein 6.5 - 8.1 g/dL 7.9 7.9 -  Total Bilirubin 0.3 - 1.2 mg/dL 0.3 0.4 -  Alkaline Phos 38 - 126 U/L 80 81 -  AST 15 - 41 U/L 12(L) 21 -  ALT 0 - 44 U/L 11 12 -     Radiology Studies: No results found.   Scheduled Meds: . amLODipine  5 mg Oral Daily  . cyclobenzaprine  5 mg Oral TID  . fluticasone furoate-vilanterol  1 puff Inhalation Daily   And  . umeclidinium bromide  1 puff Inhalation Daily  . guaiFENesin  1,200 mg Oral BID  . hydrochlorothiazide  12.5 mg Oral Daily  . insulin aspart  0-20 Units Subcutaneous TID WC  . insulin aspart  0-5 Units Subcutaneous QHS  . insulin aspart  3 Units Subcutaneous TID WC  . ipratropium-albuterol  3 mL Nebulization TID  . levofloxacin  750 mg Oral Daily  . losartan  12.5 mg Oral Daily  . metFORMIN  500 mg Oral BID WC  . methylPREDNISolone (SOLU-MEDROL)  injection  40 mg Intravenous Q12H  . metoprolol tartrate  50 mg Oral BH-q7a  . pantoprazole  40 mg Oral Daily  . pneumococcal 23 valent vaccine  0.5 mL Intramuscular Tomorrow-1000  . potassium chloride  40 mEq Oral Daily  . rivaroxaban  20 mg Oral Daily  . roflumilast  500 mcg Oral BH-q7a   Continuous Infusions:    LOS: 2 days   Time spent: [place], MD [facility] To contact the attending provider between 7A-7P or the covering provider during after hours 7P-7A, please log into the web site [email or web address] and access using  universal [place] password for that web site. If you do not have the password, please call the hospital operator.  07/27/2020, 10:14 AM

## 2020-07-27 NOTE — Progress Notes (Signed)
Pulmonary Medicine           HISTORY OF PRESENT ILLNESS   This is a 60 yr old lady well known to me, comes in with lower back pain and copd exacerbation. Today looking and sounding much better. [;aced on po regimen. . No fever, angina, edema or leg pain. The back pain is lower thoracic spine, better. She also had vague mid chest pain. No tearing sensation. Had flexeril pm.   PAST MEDICAL HISTORY   Past Medical History:  Diagnosis Date  . Anxiety   . Arthritis   . COPD (chronic obstructive pulmonary disease) ([place])   . Diabetes mellitus without complication ([place])   . Diverticulosis   . Fluid retention   . GERD (gastroesophageal reflux disease)   . History of blood clots   . Hypercholesteremia   . Hypertension   . Obesity   . Panic attacks   . Shortness of breath dyspnea   . Sleep apnea    Use C-PAP with oxygen     SURGICAL HISTORY   Past Surgical History:  Procedure Laterality Date  . BILATERAL CARPAL TUNNEL RELEASE    . COLONOSCOPY    . COLONOSCOPY N/A 01/22/2015   Procedure: COLONOSCOPY;  Surgeon: Josefine Class, MD;  Location: [facility] ENDOSCOPY;  Service: Endoscopy;  Laterality: N/A;  . COLONOSCOPY WITH PROPOFOL N/A 07/12/2016   Procedure: COLONOSCOPY WITH PROPOFOL;  Surgeon: Manya Silvas, MD;  Location: [facility] ENDOSCOPY;  Service: Endoscopy;  Laterality: N/A;  . COLONOSCOPY WITH PROPOFOL N/A 04/26/2018   Procedure: COLONOSCOPY WITH PROPOFOL;  Surgeon: Manya Silvas, MD;  Location: [facility] ENDOSCOPY;  Service: Endoscopy;  Laterality: N/A;  . ESOPHAGOGASTRODUODENOSCOPY (EGD) WITH PROPOFOL N/A 04/26/2018   Procedure: ESOPHAGOGASTRODUODENOSCOPY (EGD) WITH PROPOFOL;  Surgeon: Manya Silvas, MD;  Location: [facility] ENDOSCOPY;  Service: Endoscopy;  Laterality: N/A;  . JOINT REPLACEMENT Left 09/2015  . KNEE ARTHROSCOPY Left   . KNEE CLOSED REDUCTION Left 11/04/2015   Procedure: CLOSED MANIPULATION KNEE;  Surgeon: Hessie Knows, MD;  Location: [facility] ORS;  Service:  Orthopedics;  Laterality: Left;  . KNEE CLOSED REDUCTION Left 04/04/2016   Procedure: CLOSED MANIPULATION KNEE;  Surgeon: Hessie Knows, MD;  Location: [facility] ORS;  Service: Orthopedics;  Laterality: Left;  . SHOULDER ARTHROSCOPY W/ ROTATOR CUFF REPAIR Right   . TOTAL KNEE ARTHROPLASTY Left 10/07/2015   Procedure: TOTAL KNEE ARTHROPLASTY;  Surgeon: Hessie Knows, MD;  Location: [facility] ORS;  Service: Orthopedics;  Laterality: Left;  . TOTAL KNEE REVISION Left 02/08/2016   Procedure: TOTAL KNEE REVISION;  Surgeon: Hessie Knows, MD;  Location: [facility] ORS;  Service: Orthopedics;  Laterality: Left;  . TUBAL LIGATION       FAMILY HISTORY   Family History  Problem Relation Age of Onset  . Lung cancer Mother   . Ovarian cancer Paternal Grandmother   . Breast cancer Neg Hx      SOCIAL HISTORY   Social History   Tobacco Use  . Smoking status: Current Every Day Smoker    Packs/day: 0.50    Years: 30.00    Pack years: 15.00    Types: Cigarettes  . Smokeless tobacco: Never Used  Vaping Use  . Vaping Use: Never used  Substance Use Topics  . Alcohol use: Not Currently  . Drug use: Not Currently    Types: Oxycodone    Comment: as prescribed by MD     MEDICATIONS    Home Medication:    Current Medication:  Current Facility-Administered  Medications:  .  albuterol (PROVENTIL) (2.5 MG/3ML) 0.083% nebulizer solution 2.5 mg, 2.5 mg, Nebulization, Q2H PRN, Judd Gaudier V, MD .  amLODipine (NORVASC) tablet 5 mg, 5 mg, Oral, Daily, Hall, Carole N, DO, 5 mg at 07/27/20 0855 .  cyclobenzaprine (FLEXERIL) tablet 5 mg, 5 mg, Oral, TID, Nita Sells, MD, 5 mg at 07/27/20 0852 .  fluticasone furoate-vilanterol (BREO ELLIPTA) 100-25 MCG/INH 1 puff, 1 puff, Inhalation, Daily, 1 puff at 07/27/20 0849 **AND** umeclidinium bromide (INCRUSE ELLIPTA) 62.5 MCG/INH 1 puff, 1 puff, Inhalation, Daily, Nita Sells, MD, 1 puff at 07/27/20 0849 .  guaiFENesin (MUCINEX) 12 hr tablet 1,200 mg, 1,200  mg, Oral, BID, Verlon Au, Jai-Gurmukh, MD, 1,200 mg at 07/27/20 0854 .  hydrALAZINE (APRESOLINE) injection 5 mg, 5 mg, Intravenous, Q6H PRN, Hall, Carole N, DO .  hydrochlorothiazide (MICROZIDE) capsule 12.5 mg, 12.5 mg, Oral, Daily, Samtani, Jai-Gurmukh, MD, 12.5 mg at 07/27/20 0900 .  insulin aspart (novoLOG) injection 0-20 Units, 0-20 Units, Subcutaneous, TID WC, Athena Masse, MD, 11 Units at 07/27/20 0848 .  insulin aspart (novoLOG) injection 0-5 Units, 0-5 Units, Subcutaneous, QHS, Athena Masse, MD, 4 Units at 07/26/20 2050 .  insulin aspart (novoLOG) injection 3 Units, 3 Units, Subcutaneous, TID WC, Nita Sells, MD, 3 Units at 07/27/20 0849 .  ipratropium-albuterol (DUONEB) 0.5-2.5 (3) MG/3ML nebulizer solution 3 mL, 3 mL, Nebulization, TID, Verlon Au, Jai-Gurmukh, MD, 3 mL at 07/27/20 0813 .  levofloxacin (LEVAQUIN) tablet 750 mg, 750 mg, Oral, Daily, Verlon Au, Jai-Gurmukh, MD, 750 mg at 07/27/20 0853 .  losartan (COZAAR) tablet 12.5 mg, 12.5 mg, Oral, Daily, Samtani, Jai-Gurmukh, MD, 12.5 mg at 07/27/20 0854 .  metFORMIN (GLUCOPHAGE) tablet 500 mg, 500 mg, Oral, BID WC, Samtani, Jai-Gurmukh, MD, 500 mg at 07/27/20 0900 .  metoprolol tartrate (LOPRESSOR) tablet 50 mg, 50 mg, Oral, Reina Fuse, MD, 50 mg at 07/27/20 0855 .  oxyCODONE (Oxy IR/ROXICODONE) immediate release tablet 5 mg, 5 mg, Oral, TID PRN, Athena Masse, MD, 5 mg at 07/27/20 0855 .  pantoprazole (PROTONIX) EC tablet 40 mg, 40 mg, Oral, Daily, Verlon Au, Jai-Gurmukh, MD, 40 mg at 07/27/20 0855 .  pneumococcal 23 valent vaccine (PNEUMOVAX-23) injection 0.5 mL, 0.5 mL, Intramuscular, Tomorrow-1000, Duncan, Hazel V, MD .  polyethylene glycol powder (GLYCOLAX/MIRALAX) container 17 g, 17 g, Oral, Daily PRN, Samtani, Jai-Gurmukh, MD .  potassium chloride SA (KLOR-CON) CR tablet 40 mEq, 40 mEq, Oral, Daily, Verlon Au, Jai-Gurmukh, MD, 40 mEq at 07/27/20 0854 .  predniSONE (DELTASONE) tablet 60 mg, 60 mg, Oral,  QAC breakfast, Samtani, Jai-Gurmukh, MD .  rivaroxaban (XARELTO) tablet 20 mg, 20 mg, Oral, Daily, Judd Gaudier V, MD, 20 mg at 07/27/20 0855 .  roflumilast (DALIRESP) tablet 500 mcg, 500 mcg, Oral, BH-q7a, Nita Sells, MD, 500 mcg at 07/27/20 0856    ALLERGIES   Celebrex [celecoxib] and Dilaudid [hydromorphone]     REVIEW OF SYSTEMS    Review of Systems:  Gen:  Denies  fever, sweats, chills weigh loss  HEENT: Denies blurred vision, double vision, ear pain, eye pain, hearing loss, nose bleeds, sore throat Cardiac:  No dizziness, chest pain or heaviness, chest tightness,edema Resp:   Denies cough or sputum porduction,  Less shortness of breath, no wheezing, hemoptysis,  Gi: Denies swallowing difficulty, stomach pain, nausea or vomiting, diarrhea, constipation, bowel incontinence Gu:  Denies bladder incontinence, burning urine Ext:   Denies Joint pain, stiffness or swelling Skin: Denies  skin rash, easy bruising or bleeding or hives Endoc:  Denies  polyuria, polydipsia , polyphagia or weight change Psych:   Denies depression, insomnia or hallucinations   Other:  All other systems negative   VS: BP 122/75 (BP Location: Right Arm)   Pulse 86   Temp 98 F (36.7 C) (Oral)   Resp 17   Ht 5\' 2"  (1.575 m)   Wt 129.9 kg   LMP 04/24/2014 (Approximate)   SpO2 100%   BMI 52.38 kg/m      PHYSICAL EXAM    GENERAL:NAD, no fevers, chills, no weakness no fatigue, sitting in chair, over weight HEAD: Normocephalic, atraumatic.  EYES: Pupils equal, round, reactive to light. Extraocular muscles intact. No scleral icterus.  MOUTH: Moist mucosal membrane. Dentition intact. No abscess noted.  EAR, NOSE, THROAT: Clear without exudates. No external lesions.  NECK: Supple. No thyromegaly. No nodules. No JVD.  PULMONARY: Diffuse coarse rhonchi right sided - wheezes CARDIOVASCULAR: S1 and S2. Regular rate and rhythm. No murmurs, rubs, or gallops. No edema. Pedal pulses 2+  bilaterally.  GASTROINTESTINAL: Soft, nontender, nondistended. No masses. Positive bowel sounds. No hepatosplenomegaly.  MUSCULOSKELETAL: No swelling, clubbing, or edema. Range of motion full in all extremities.  NEUROLOGIC: Cranial nerves II through XII are intact. No gross focal neurological deficits. Sensation intact. Reflexes intact.  SKIN: No ulceration, lesions, rashes, or cyanosis. Skin warm and dry. Turgor intact.  PSYCHIATRIC: Mood, affect within normal limits. The patient is awake, alert and oriented x 3. Insight, judgment intact.       IMAGING    DG Chest 2 View  Result Date: 07/24/2020 CLINICAL DATA:  Chest pain.  Current smoker. EXAM: CHEST - 2 VIEW COMPARISON:  Chest radiograph 06/03/2020 FINDINGS: Stable cardiomediastinal contours. There are chronic coarse bilateral interstitial opacities, likely relating to smoking history. No new focal consolidation. No pneumothorax or large pleural effusion. No acute finding in the visualized skeleton. IMPRESSION: Chronic diffuse bilateral interstitial opacities, likely chronic bronchitic change, difficult to exclude superimposed acute interstitial process. No new focal consolidation. Electronically Signed   By: Audie Pinto M.D.   On: 07/24/2020 18:24      ASSESSMENT/PLAN   Her hx and presentation is c/w copd exacerbation, she is on approriate regimen. Today doing much better. Om po regimen Suspect tomorrow she should be able to be  d/c with out patient f/u in pulm in one week. Patient is aware of the plans. Complete 7=10 days of antibiotics ( on levaquin) and  Prednisone 20 mg q day until she sees me in pulm clinic next wednesday   Thank you for allowing me to participate in the care of this patient.   Patient/Family are satisfied with care plan and all questions have been answered.  This document was prepared using Dragon voice recognition software and may include unintentional dictation errors.     Wallene Huh, M.D.   Division of [place]

## 2020-07-28 LAB — RENAL FUNCTION PANEL
Albumin: 2.9 g/dL — ABNORMAL LOW (ref 3.5–5.0)
Anion gap: 9 (ref 5–15)
BUN: 17 mg/dL (ref 6–20)
CO2: 36 mmol/L — ABNORMAL HIGH (ref 22–32)
Calcium: 9.3 mg/dL (ref 8.9–10.3)
Chloride: 94 mmol/L — ABNORMAL LOW (ref 98–111)
Creatinine, Ser: 0.48 mg/dL (ref 0.44–1.00)
GFR, Estimated: >60 mL/min (ref >60)
Glucose, Bld: 281 mg/dL — ABNORMAL HIGH (ref 70–99)
Phosphorus: 2.8 mg/dL (ref 2.5–4.6)
Potassium: 3.8 mmol/L (ref 3.5–5.1)
Sodium: 139 mmol/L (ref 135–145)

## 2020-07-28 LAB — GLUCOSE, CAPILLARY
Glucose-Capillary: 212 mg/dL — ABNORMAL HIGH (ref 70–99)
Glucose-Capillary: 335 mg/dL — ABNORMAL HIGH (ref 70–99)

## 2020-07-28 MED ORDER — PREDNISONE 20 MG PO TABS: 20.0000 mg | ORAL_TABLET | Freq: Every day | ORAL | 0 refills | Status: AC

## 2020-07-28 MED ORDER — PREDNISONE 20 MG PO TABS
40.0000 mg | ORAL_TABLET | Freq: Every day | ORAL | Status: DC
  Administered 2020-07-28: 10:00:00 40 mg via ORAL
  Filled 2020-07-28: qty 2

## 2020-07-28 NOTE — Discharge Summary (Signed)
Physician Discharge Summary  Patty Leon [id number] DOB: 06-Apr-1960 DOA: 07/24/2020  PCP: Danelle Berry, NP  Admit date: 07/24/2020 Discharge date: 07/28/2020  Admitted From: Home  Disposition:  Home   Recommendations for Outpatient Follow-up:  1. Follow up with PCP in 1-2 weeks 2. Please obtain BMP/CBC in one week 3. Needs put patient spirometry.  4.    Discharge Condition: Stable.  CODE STATUS: Full code Diet recommendation: Carb Modified  Brief/Interim Summary: 60 year old with past medical history significant for COPD yesterday smoker, present with COPD exacerbation, BMI 52, morbid obese, diabetes type 2, OSA on BiPAP, DVT on Xarelto.  She was admitted with COPD exacerbation.  For further details refer to discharge summary performed by Dr. Verlon Au. Follow with changes made to medications, patient was discharged on prednisone 20 mg daily.  Continue with Levaquin. See  updated medication list  Her hyperglycemia, plan to continue with metformin and resume Jardiance. CBG will likely improve after taper off prednisone    Discharge Diagnoses:  Principal Problem:   COPD with acute exacerbation ([place]) Active Problems:   Diabetes ([place])   Benign essential hypertension   Obstructive sleep apnea   Chronic respiratory failure with hypoxia ([facility])   Chronic anticoagulation   History of DVT (deep vein thrombosis)   COPD exacerbation ([facility])   Obesity, Class III, BMI 40-49.9 (morbid obesity) ([place])    Discharge Instructions  Discharge Instructions    Diet - low sodium heart healthy   Complete by: As directed    Diet - low sodium heart healthy   Complete by: As directed    Discharge instructions   Complete by: As directed    Look at your medications carefully in particular your steroids and Levaquin dosing You will need close follow-up with Dr. Raul Del in the outpatient setting We will continue your oxygen Please get labs in about 1 week at primary care physician or Dr.  Gust Brooms office to check your potassium Best of luck with weight loss keep working hard   Increase activity slowly   Complete by: As directed    Increase activity slowly   Complete by: As directed      Allergies as of 07/28/2020      Reactions   Celebrex [celecoxib] Swelling   Dilaudid [hydromorphone] Itching      Medication List    STOP taking these medications   Bydureon BCise 2 MG/0.85ML Auij Generic drug: Exenatide ER   docusate sodium 100 MG capsule Commonly known as: COLACE   Klor-Con 10 10 MEQ tablet Generic drug: potassium chloride     TAKE these medications   albuterol 108 (90 Base) MCG/ACT inhaler Commonly known as: VENTOLIN HFA Inhale 2 puffs into the lungs every 6 (six) hours as needed for wheezing.   amLODipine 5 MG tablet Commonly known as: NORVASC Take 5 mg by mouth every morning.   cetirizine 10 MG tablet Commonly known as: ZYRTEC 10 mg daily.   citalopram 10 MG tablet Commonly known as: CELEXA Take 10 mg by mouth at bedtime.   cyclobenzaprine 10 MG tablet Commonly known as: FLEXERIL Take 0.5 tablets (5 mg total) by mouth 3 (three) times daily.   ferrous sulfate 325 (65 FE) MG tablet Take by mouth.   guaiFENesin 600 MG 12 hr tablet Commonly known as: MUCINEX Take 2 tablets (1,200 mg total) by mouth 2 (two) times daily.   hydrochlorothiazide 25 MG tablet Commonly known as: HYDRODIURIL Take 0.5 tablets (12.5 mg total) by mouth daily. What changed: how  much to take   Jardiance 25 MG Tabs tablet Generic drug: empagliflozin Take 25 mg by mouth every morning.   levofloxacin 750 MG tablet Commonly known as: LEVAQUIN Take 1 tablet (750 mg total) by mouth daily.   losartan 25 MG tablet Commonly known as: COZAAR Take 25 mg by mouth daily.   metFORMIN 500 MG tablet Commonly known as: GLUCOPHAGE Take 500 mg by mouth 2 (two) times daily.   metoprolol tartrate 50 MG tablet Commonly known as: LOPRESSOR Take 50 mg by mouth every morning.    omeprazole 40 MG capsule Commonly known as: PRILOSEC Take 40 mg by mouth every morning.   oxyCODONE 5 MG immediate release tablet Commonly known as: Oxy IR/ROXICODONE Take 5 mg by mouth 3 (three) times daily as needed.   OXYGEN Inhale into the lungs.   polyethylene glycol powder 17 GM/SCOOP powder Commonly known as: GLYCOLAX/MIRALAX Take 17 g by mouth daily as needed for mild constipation.   potassium chloride SA 20 MEQ tablet Commonly known as: KLOR-CON Take 2 tablets (40 mEq total) by mouth daily.   predniSONE 20 MG tablet Commonly known as: DELTASONE Take 1 tablet (20 mg total) by mouth daily before breakfast for 7 days.   roflumilast 500 MCG Tabs tablet Commonly known as: DALIRESP Take 500 mcg by mouth every morning.   rosuvastatin 10 MG tablet Commonly known as: CRESTOR Take 10 mg by mouth every evening.   Trelegy Ellipta 100-62.5-25 MCG/INH Aepb Generic drug: Fluticasone-Umeclidin-Vilant Inhale into the lungs daily.   Vitamin D 50 MCG (2000 UT) Caps Take 2,000 Units by mouth daily.   Xarelto 20 MG Tabs tablet Generic drug: rivaroxaban Take 20 mg by mouth daily.       Follow-up Information    Danelle Berry, NP On 08/04/2020.   Specialty: Nurse Practitioner Why: @10 :Max Sane information: [place] [id number] [phone]        Erby Pian, MD Follow up in 1 week(s).   Specialty: Specialist Why: call office to arrange follow up  Contact information: [address] [place] [address] [phone]              Allergies  Allergen Reactions  . Celebrex [Celecoxib] Swelling  . Dilaudid [Hydromorphone] Itching    Consultations: Pulmonologist   Procedures/Studies: DG Chest 2 View  Result Date: 07/24/2020 CLINICAL DATA:  Chest pain.  Current smoker. EXAM: CHEST - 2 VIEW COMPARISON:  Chest radiograph 06/03/2020 FINDINGS: Stable cardiomediastinal contours. There are chronic coarse bilateral  interstitial opacities, likely relating to smoking history. No new focal consolidation. No pneumothorax or large pleural effusion. No acute finding in the visualized skeleton. IMPRESSION: Chronic diffuse bilateral interstitial opacities, likely chronic bronchitic change, difficult to exclude superimposed acute interstitial process. No new focal consolidation. Electronically Signed   By: Audie Pinto M.D.   On: 07/24/2020 18:24     Subjective: She is breathing better, she is ready to go home.  Discharge Exam: Vitals:   07/28/20 0747 07/28/20 0834  BP: 118/80   Pulse: 82   Resp: 18   Temp: 98 F (36.7 C)   SpO2: 96% 93%     General: Pt is alert, awake, not in acute distress Cardiovascular: RRR, S1/S2 +, no rubs, no gallops Respiratory: CTA bilaterally, no wheezing, no rhonchi Abdominal: Soft, NT, ND, bowel sounds + Extremities: no edema, no cyanosis    The results of significant diagnostics from this hospitalization (including imaging, microbiology, ancillary and laboratory) are listed below for  reference.     Microbiology: Recent Results (from the past 240 hour(s))  Resp Panel by RT-PCR (Flu A&B, Covid) Nasopharyngeal Swab     Status: None   Collection Time: 07/24/20  7:13 PM   Specimen: Nasopharyngeal Swab; Nasopharyngeal(NP) swabs in vial transport medium  Result Value Ref Range Status   SARS Coronavirus 2 by RT PCR NEGATIVE NEGATIVE Final    Comment: (NOTE) SARS-CoV-2 target nucleic acids are NOT DETECTED.  The SARS-CoV-2 RNA is generally detectable in upper respiratory specimens during the acute phase of infection. The lowest concentration of SARS-CoV-2 viral copies this assay can detect is 138 copies/mL. A negative result does not preclude SARS-Cov-2 infection and should not be used as the sole basis for treatment or other patient management decisions. A negative result may occur with  improper specimen collection/handling, submission of specimen other than  nasopharyngeal swab, presence of viral mutation(s) within the areas targeted by this assay, and inadequate number of viral copies(<138 copies/mL). A negative result must be combined with clinical observations, patient history, and epidemiological information. The expected result is Negative.  Fact Sheet for Patients:  EntrepreneurPulse.com.au  Fact Sheet for Healthcare Providers:  IncredibleEmployment.be  This test is no t yet approved or cleared by the [place] FDA and  has been authorized for detection and/or diagnosis of SARS-CoV-2 by FDA under an Emergency Use Authorization (EUA). This EUA will remain  in effect (meaning this test can be used) for the duration of the COVID-19 declaration under Section 564(b)(1) of the Act, 21 U.S.C.section 360bbb-3(b)(1), unless the authorization is terminated  or revoked sooner.       Influenza A by PCR NEGATIVE NEGATIVE Final   Influenza B by PCR NEGATIVE NEGATIVE Final    Comment: (NOTE) The Xpert Xpress SARS-CoV-2/FLU/RSV plus assay is intended as an aid in the diagnosis of influenza from Nasopharyngeal swab specimens and should not be used as a sole basis for treatment. Nasal washings and aspirates are unacceptable for Xpert Xpress SARS-CoV-2/FLU/RSV testing.  Fact Sheet for Patients: EntrepreneurPulse.com.au  Fact Sheet for Healthcare Providers: IncredibleEmployment.be  This test is not yet approved or cleared by the [place] FDA and has been authorized for detection and/or diagnosis of SARS-CoV-2 by FDA under an Emergency Use Authorization (EUA). This EUA will remain in effect (meaning this test can be used) for the duration of the COVID-19 declaration under Section 564(b)(1) of the Act, 21 U.S.C. section 360bbb-3(b)(1), unless the authorization is terminated or revoked.  Performed at [facility], [address] [place], [place]  [address]      Labs: BNP (last 3 results) No results for input(s): BNP in the last 8760 hours. Basic Metabolic Panel: Recent Labs  Lab 07/24/20 1743 07/25/20 1132 07/26/20 0406 07/28/20 0352  NA 140 136 141 139  K 3.1* 3.5 3.9 3.8  CL 94* 92* 94* 94*  CO2 35* 33* 37* 36*  GLUCOSE 123* 350* 211* 281*  BUN 10 11 13 17   CREATININE 0.57 0.62 0.57 0.48  CALCIUM 9.4 9.6 9.7 9.3  MG  --  2.2  --   --   PHOS  --   --   --  2.8   Liver Function Tests: Recent Labs  Lab 07/25/20 1132 07/26/20 0406 07/28/20 0352  AST 21 12*  --   ALT 12 11  --   ALKPHOS 81 80  --   BILITOT 0.4 0.3  --   PROT 7.9 7.9  --   ALBUMIN 3.4* 3.3* 2.9*  No results for input(s): LIPASE, AMYLASE in the last 168 hours. No results for input(s): AMMONIA in the last 168 hours. CBC: Recent Labs  Lab 07/24/20 1743 07/26/20 0406  WBC 11.3* 13.0*  NEUTROABS  --  11.6*  HGB 15.3* 14.8  HCT 47.9* 46.6*  MCV 92.6 94.1  PLT 262 282   Cardiac Enzymes: No results for input(s): CKTOTAL, CKMB, CKMBINDEX, TROPONINI in the last 168 hours. BNP: Invalid input(s): POCBNP CBG: Recent Labs  Lab 07/27/20 1401 07/27/20 1626 07/27/20 2045 07/28/20 0032 07/28/20 0745  GLUCAP 279* 322* 452* 335* 212*   D-Dimer No results for input(s): DDIMER in the last 72 hours. Hgb A1c No results for input(s): HGBA1C in the last 72 hours. Lipid Profile No results for input(s): CHOL, HDL, LDLCALC, TRIG, CHOLHDL, LDLDIRECT in the last 72 hours. Thyroid function studies No results for input(s): TSH, T4TOTAL, T3FREE, THYROIDAB in the last 72 hours.  Invalid input(s): FREET3 Anemia work up No results for input(s): VITAMINB12, FOLATE, FERRITIN, TIBC, IRON, RETICCTPCT in the last 72 hours. Urinalysis    Component Value Date/Time   COLORURINE YELLOW (A) 04/25/2017 0651   APPEARANCEUR CLEAR (A) 04/25/2017 0651   APPEARANCEUR Clear 09/03/2012 0739   LABSPEC 1.011 04/25/2017 0651   LABSPEC 1.005 09/03/2012 0739   PHURINE 6.0  04/25/2017 0651   GLUCOSEU NEGATIVE 04/25/2017 0651   GLUCOSEU Negative 09/03/2012 0739   HGBUR MODERATE (A) 04/25/2017 0651   BILIRUBINUR NEGATIVE 04/25/2017 0651   BILIRUBINUR Negative 09/03/2012 0739   [place] 04/25/2017 0651   PROTEINUR NEGATIVE 04/25/2017 0651   NITRITE NEGATIVE 04/25/2017 0651   LEUKOCYTESUR NEGATIVE 04/25/2017 0651   LEUKOCYTESUR Negative 09/03/2012 0739   Sepsis Labs Invalid input(s): PROCALCITONIN,  WBC,  LACTICIDVEN Microbiology Recent Results (from the past 240 hour(s))  Resp Panel by RT-PCR (Flu A&B, Covid) Nasopharyngeal Swab     Status: None   Collection Time: 07/24/20  7:13 PM   Specimen: Nasopharyngeal Swab; Nasopharyngeal(NP) swabs in vial transport medium  Result Value Ref Range Status   SARS Coronavirus 2 by RT PCR NEGATIVE NEGATIVE Final    Comment: (NOTE) SARS-CoV-2 target nucleic acids are NOT DETECTED.  The SARS-CoV-2 RNA is generally detectable in upper respiratory specimens during the acute phase of infection. The lowest concentration of SARS-CoV-2 viral copies this assay can detect is 138 copies/mL. A negative result does not preclude SARS-Cov-2 infection and should not be used as the sole basis for treatment or other patient management decisions. A negative result may occur with  improper specimen collection/handling, submission of specimen other than nasopharyngeal swab, presence of viral mutation(s) within the areas targeted by this assay, and inadequate number of viral copies(<138 copies/mL). A negative result must be combined with clinical observations, patient history, and epidemiological information. The expected result is Negative.  Fact Sheet for Patients:  EntrepreneurPulse.com.au  Fact Sheet for Healthcare Providers:  IncredibleEmployment.be  This test is no t yet approved or cleared by the [place] FDA and  has been authorized for detection and/or diagnosis of  SARS-CoV-2 by FDA under an Emergency Use Authorization (EUA). This EUA will remain  in effect (meaning this test can be used) for the duration of the COVID-19 declaration under Section 564(b)(1) of the Act, 21 U.S.C.section 360bbb-3(b)(1), unless the authorization is terminated  or revoked sooner.       Influenza A by PCR NEGATIVE NEGATIVE Final   Influenza B by PCR NEGATIVE NEGATIVE Final    Comment: (NOTE) The Xpert Xpress SARS-CoV-2/FLU/RSV plus assay is  intended as an aid in the diagnosis of influenza from Nasopharyngeal swab specimens and should not be used as a sole basis for treatment. Nasal washings and aspirates are unacceptable for Xpert Xpress SARS-CoV-2/FLU/RSV testing.  Fact Sheet for Patients: EntrepreneurPulse.com.au  Fact Sheet for Healthcare Providers: IncredibleEmployment.be  This test is not yet approved or cleared by the [place] FDA and has been authorized for detection and/or diagnosis of SARS-CoV-2 by FDA under an Emergency Use Authorization (EUA). This EUA will remain in effect (meaning this test can be used) for the duration of the COVID-19 declaration under Section 564(b)(1) of the Act, 21 U.S.C. section 360bbb-3(b)(1), unless the authorization is terminated or revoked.  Performed at [facility], [address], [place], [place] [address]      Time coordinating discharge: 40 minutes  SIGNED:   Elmarie Shiley, MD  [facility]

## 2020-09-22 ENCOUNTER — Other Ambulatory Visit: Payer: Self-pay | Admitting: Nurse Practitioner

## 2020-09-22 DIAGNOSIS — Z1231 Encounter for screening mammogram for malignant neoplasm of breast: Secondary | ICD-10-CM

## 2020-10-16 ENCOUNTER — Inpatient Hospital Stay: Payer: Medicaid Other

## 2020-10-16 ENCOUNTER — Encounter: Payer: Self-pay | Admitting: Emergency Medicine

## 2020-10-16 ENCOUNTER — Inpatient Hospital Stay
Admission: EM | Admit: 2020-10-16 | Discharge: 2020-10-18 | DRG: 189 | Disposition: A | Discharge status: Home Health | Payer: Medicaid Other | Attending: Hospitalist | Admitting: Hospitalist

## 2020-10-16 ENCOUNTER — Emergency Department: Payer: Medicaid Other

## 2020-10-16 ENCOUNTER — Other Ambulatory Visit: Payer: Self-pay

## 2020-10-16 DIAGNOSIS — I509 Heart failure, unspecified: Secondary | ICD-10-CM

## 2020-10-16 DIAGNOSIS — G4733 Obstructive sleep apnea (adult) (pediatric): Secondary | ICD-10-CM | POA: Diagnosis present

## 2020-10-16 DIAGNOSIS — K219 Gastro-esophageal reflux disease without esophagitis: Secondary | ICD-10-CM | POA: Diagnosis present

## 2020-10-16 DIAGNOSIS — F17210 Nicotine dependence, cigarettes, uncomplicated: Secondary | ICD-10-CM | POA: Diagnosis present

## 2020-10-16 DIAGNOSIS — Z96653 Presence of artificial knee joint, bilateral: Secondary | ICD-10-CM | POA: Diagnosis present

## 2020-10-16 DIAGNOSIS — R0600 Dyspnea, unspecified: Principal | ICD-10-CM

## 2020-10-16 DIAGNOSIS — I10 Essential (primary) hypertension: Secondary | ICD-10-CM | POA: Diagnosis present

## 2020-10-16 DIAGNOSIS — E7800 Pure hypercholesterolemia, unspecified: Secondary | ICD-10-CM | POA: Diagnosis present

## 2020-10-16 DIAGNOSIS — Z9981 Dependence on supplemental oxygen: Secondary | ICD-10-CM

## 2020-10-16 DIAGNOSIS — Z7901 Long term (current) use of anticoagulants: Secondary | ICD-10-CM

## 2020-10-16 DIAGNOSIS — E1165 Type 2 diabetes mellitus with hyperglycemia: Secondary | ICD-10-CM

## 2020-10-16 DIAGNOSIS — R0609 Other forms of dyspnea: Secondary | ICD-10-CM

## 2020-10-16 DIAGNOSIS — Z20822 Contact with and (suspected) exposure to covid-19: Secondary | ICD-10-CM | POA: Diagnosis present

## 2020-10-16 DIAGNOSIS — I71019 Dissection of thoracic aorta, unspecified: Secondary | ICD-10-CM

## 2020-10-16 DIAGNOSIS — E119 Type 2 diabetes mellitus without complications: Secondary | ICD-10-CM | POA: Diagnosis present

## 2020-10-16 DIAGNOSIS — R079 Chest pain, unspecified: Secondary | ICD-10-CM | POA: Diagnosis present

## 2020-10-16 DIAGNOSIS — Z7984 Long term (current) use of oral hypoglycemic drugs: Secondary | ICD-10-CM

## 2020-10-16 DIAGNOSIS — J441 Chronic obstructive pulmonary disease with (acute) exacerbation: Secondary | ICD-10-CM | POA: Diagnosis present

## 2020-10-16 DIAGNOSIS — I700 Atherosclerosis of aorta: Secondary | ICD-10-CM | POA: Diagnosis present

## 2020-10-16 DIAGNOSIS — I7101 Dissection of thoracic aorta: Secondary | ICD-10-CM

## 2020-10-16 DIAGNOSIS — M5489 Other dorsalgia: Secondary | ICD-10-CM

## 2020-10-16 DIAGNOSIS — R0902 Hypoxemia: Secondary | ICD-10-CM

## 2020-10-16 DIAGNOSIS — F17200 Nicotine dependence, unspecified, uncomplicated: Secondary | ICD-10-CM | POA: Diagnosis present

## 2020-10-16 DIAGNOSIS — Z79899 Other long term (current) drug therapy: Secondary | ICD-10-CM

## 2020-10-16 DIAGNOSIS — F419 Anxiety disorder, unspecified: Secondary | ICD-10-CM | POA: Diagnosis present

## 2020-10-16 DIAGNOSIS — I2510 Atherosclerotic heart disease of native coronary artery without angina pectoris: Secondary | ICD-10-CM | POA: Diagnosis present

## 2020-10-16 DIAGNOSIS — Z888 Allergy status to other drugs, medicaments and biological substances status: Secondary | ICD-10-CM

## 2020-10-16 DIAGNOSIS — J9600 Acute respiratory failure, unspecified whether with hypoxia or hypercapnia: Secondary | ICD-10-CM | POA: Insufficient documentation

## 2020-10-16 DIAGNOSIS — G5603 Carpal tunnel syndrome, bilateral upper limbs: Secondary | ICD-10-CM | POA: Diagnosis present

## 2020-10-16 DIAGNOSIS — Z86718 Personal history of other venous thrombosis and embolism: Secondary | ICD-10-CM

## 2020-10-16 DIAGNOSIS — E66813 Obesity, class 3: Secondary | ICD-10-CM | POA: Diagnosis present

## 2020-10-16 DIAGNOSIS — F410 Panic disorder [episodic paroxysmal anxiety] without agoraphobia: Secondary | ICD-10-CM | POA: Diagnosis present

## 2020-10-16 DIAGNOSIS — J9611 Chronic respiratory failure with hypoxia: Principal | ICD-10-CM | POA: Diagnosis present

## 2020-10-16 DIAGNOSIS — I5020 Unspecified systolic (congestive) heart failure: Secondary | ICD-10-CM

## 2020-10-16 DIAGNOSIS — I517 Cardiomegaly: Secondary | ICD-10-CM | POA: Diagnosis present

## 2020-10-16 DIAGNOSIS — Z6841 Body Mass Index (BMI) 40.0 and over, adult: Secondary | ICD-10-CM

## 2020-10-16 LAB — BASIC METABOLIC PANEL
Anion gap: 15 (ref 5–15)
BUN: 12 mg/dL (ref 6–20)
CO2: 40 mmol/L — ABNORMAL HIGH (ref 22–32)
Calcium: 9.2 mg/dL (ref 8.9–10.3)
Chloride: 86 mmol/L — ABNORMAL LOW (ref 98–111)
Creatinine, Ser: 0.63 mg/dL (ref 0.44–1.00)
GFR, Estimated: >60 mL/min (ref >60)
Glucose, Bld: 177 mg/dL — ABNORMAL HIGH (ref 70–99)
Potassium: 3 mmol/L — ABNORMAL LOW (ref 3.5–5.1)
Sodium: 141 mmol/L (ref 135–145)

## 2020-10-16 LAB — TROPONIN I (HIGH SENSITIVITY)
Troponin I (High Sensitivity): 10 ng/L (ref <18)
Troponin I (High Sensitivity): 9 ng/L (ref <18)

## 2020-10-16 LAB — RESP PANEL BY RT-PCR (FLU A&B, COVID) ARPGX2
Influenza A by PCR: NEGATIVE
Influenza B by PCR: NEGATIVE
SARS Coronavirus 2 by RT PCR: NEGATIVE

## 2020-10-16 LAB — CBC
HCT: 49.4 % — ABNORMAL HIGH (ref 36.0–46.0)
Hemoglobin: 15.3 g/dL — ABNORMAL HIGH (ref 12.0–15.0)
MCH: 30.9 pg (ref 26.0–34.0)
MCHC: 31 g/dL (ref 30.0–36.0)
MCV: 99.8 fL (ref 80.0–100.0)
Platelets: 277 10*3/uL (ref 150–400)
RBC: 4.95 MIL/uL (ref 3.87–5.11)
RDW: 13.7 % (ref 11.5–15.5)
WBC: 10.9 10*3/uL — ABNORMAL HIGH (ref 4.0–10.5)
nRBC: 0.2 % (ref 0.0–0.2)

## 2020-10-16 LAB — PROTIME-INR
INR: 1 (ref 0.8–1.2)
Prothrombin Time: 13 seconds (ref 11.4–15.2)

## 2020-10-16 LAB — GLUCOSE, CAPILLARY
Glucose-Capillary: 193 mg/dL — ABNORMAL HIGH (ref 70–99)
Glucose-Capillary: 177 mg/dL — ABNORMAL HIGH (ref 70–99)

## 2020-10-16 LAB — BRAIN NATRIURETIC PEPTIDE: B Natriuretic Peptide: 242.8 pg/mL — ABNORMAL HIGH (ref 0.0–100.0)

## 2020-10-16 LAB — HIV ANTIBODY (ROUTINE TESTING W REFLEX): HIV Screen 4th Generation wRfx: NONREACTIVE

## 2020-10-16 MED ORDER — VITAMIN D 25 MCG (1000 UNIT) PO TABS
2000.0000 [IU] | ORAL_TABLET | Freq: Every day | ORAL | Status: DC
  Administered 2020-10-16 – 2020-10-18 (×3): 2000 [IU] via ORAL
  Filled 2020-10-16 (×3): qty 2

## 2020-10-16 MED ORDER — LORATADINE 10 MG PO TABS
10.0000 mg | ORAL_TABLET | Freq: Every day | ORAL | Status: DC
  Administered 2020-10-16 – 2020-10-18 (×3): 10 mg via ORAL
  Filled 2020-10-16 (×3): qty 1

## 2020-10-16 MED ORDER — FENTANYL CITRATE (PF) 100 MCG/2ML IJ SOLN
50.0000 ug | Freq: Once | INTRAMUSCULAR | Status: AC
  Administered 2020-10-16: 50 ug via INTRAVENOUS
  Filled 2020-10-16: qty 2

## 2020-10-16 MED ORDER — FLUTICASONE-UMECLIDIN-VILANT 100-62.5-25 MCG/INH IN AEPB: INHALATION_SPRAY | Freq: Every day | RESPIRATORY_TRACT | Status: DC

## 2020-10-16 MED ORDER — DOCUSATE SODIUM 100 MG PO CAPS
100.0000 mg | ORAL_CAPSULE | Freq: Every day | ORAL | Status: DC
  Administered 2020-10-16 – 2020-10-18 (×3): 100 mg via ORAL
  Filled 2020-10-16 (×3): qty 1

## 2020-10-16 MED ORDER — RIVAROXABAN 20 MG PO TABS
20.0000 mg | ORAL_TABLET | Freq: Every day | ORAL | Status: DC
  Administered 2020-10-16 – 2020-10-18 (×3): 20 mg via ORAL
  Filled 2020-10-16 (×3): qty 1

## 2020-10-16 MED ORDER — FUROSEMIDE 10 MG/ML IJ SOLN
20.0000 mg | Freq: Once | INTRAMUSCULAR | Status: AC
  Administered 2020-10-16: 20 mg via INTRAVENOUS
  Filled 2020-10-16: qty 4

## 2020-10-16 MED ORDER — ACETAMINOPHEN 325 MG PO TABS
650.0000 mg | ORAL_TABLET | Freq: Once | ORAL | Status: AC
  Administered 2020-10-16: 650 mg via ORAL
  Filled 2020-10-16: qty 2

## 2020-10-16 MED ORDER — SODIUM CHLORIDE 0.9 % IV SOLN: 250.0000 mL | INTRAVENOUS | Status: DC | PRN

## 2020-10-16 MED ORDER — POTASSIUM CHLORIDE 20 MEQ PO PACK
40.0000 meq | PACK | Freq: Every day | ORAL | Status: DC
  Administered 2020-10-16 – 2020-10-18 (×3): 40 meq via ORAL
  Filled 2020-10-16 (×3): qty 2

## 2020-10-16 MED ORDER — ROFLUMILAST 500 MCG PO TABS
500.0000 ug | ORAL_TABLET | ORAL | Status: DC
  Administered 2020-10-16 – 2020-10-18 (×3): 500 ug via ORAL
  Filled 2020-10-16 (×3): qty 1

## 2020-10-16 MED ORDER — FLUTICASONE FUROATE-VILANTEROL 100-25 MCG/INH IN AEPB
1.0000 | INHALATION_SPRAY | Freq: Every day | RESPIRATORY_TRACT | Status: DC
  Administered 2020-10-16 – 2020-10-18 (×3): 1 via RESPIRATORY_TRACT
  Filled 2020-10-16: qty 28

## 2020-10-16 MED ORDER — IPRATROPIUM-ALBUTEROL 0.5-2.5 (3) MG/3ML IN SOLN
3.0000 mL | Freq: Once | RESPIRATORY_TRACT | Status: AC
  Administered 2020-10-16: 3 mL via RESPIRATORY_TRACT
  Filled 2020-10-16: qty 3

## 2020-10-16 MED ORDER — IOHEXOL 350 MG/ML SOLN
100.0000 mL | Freq: Once | INTRAVENOUS | Status: AC | PRN
  Administered 2020-10-16: 100 mL via INTRAVENOUS

## 2020-10-16 MED ORDER — CITALOPRAM HYDROBROMIDE 20 MG PO TABS
10.0000 mg | ORAL_TABLET | Freq: Every day | ORAL | Status: DC
  Administered 2020-10-16 – 2020-10-17 (×2): 10 mg via ORAL
  Filled 2020-10-16 (×2): qty 1

## 2020-10-16 MED ORDER — LORAZEPAM 2 MG/ML IJ SOLN
1.0000 mg | Freq: Once | INTRAMUSCULAR | Status: AC
  Administered 2020-10-16: 1 mg via INTRAVENOUS
  Filled 2020-10-16: qty 1

## 2020-10-16 MED ORDER — ROSUVASTATIN CALCIUM 10 MG PO TABS
10.0000 mg | ORAL_TABLET | Freq: Every evening | ORAL | Status: DC
  Administered 2020-10-16 – 2020-10-17 (×2): 10 mg via ORAL
  Filled 2020-10-16 (×2): qty 1

## 2020-10-16 MED ORDER — ACETAMINOPHEN 325 MG PO TABS
650.0000 mg | ORAL_TABLET | Freq: Four times a day (QID) | ORAL | Status: DC | PRN
  Administered 2020-10-16 – 2020-10-17 (×4): 650 mg via ORAL
  Filled 2020-10-16 (×5): qty 2

## 2020-10-16 MED ORDER — IPRATROPIUM-ALBUTEROL 0.5-2.5 (3) MG/3ML IN SOLN
3.0000 mL | Freq: Four times a day (QID) | RESPIRATORY_TRACT | Status: DC | PRN
  Administered 2020-10-16 – 2020-10-18 (×3): 3 mL via RESPIRATORY_TRACT
  Filled 2020-10-16 (×3): qty 3

## 2020-10-16 MED ORDER — AMLODIPINE BESYLATE 5 MG PO TABS
5.0000 mg | ORAL_TABLET | ORAL | Status: DC
  Administered 2020-10-16 – 2020-10-18 (×3): 5 mg via ORAL
  Filled 2020-10-16 (×3): qty 1

## 2020-10-16 MED ORDER — POTASSIUM CHLORIDE CRYS ER 20 MEQ PO TBCR: 40.0000 meq | EXTENDED_RELEASE_TABLET | Freq: Every day | ORAL | Status: DC

## 2020-10-16 MED ORDER — UMECLIDINIUM BROMIDE 62.5 MCG/INH IN AEPB
1.0000 | INHALATION_SPRAY | Freq: Every day | RESPIRATORY_TRACT | Status: DC
  Administered 2020-10-16 – 2020-10-18 (×3): 1 via RESPIRATORY_TRACT
  Filled 2020-10-16: qty 7

## 2020-10-16 MED ORDER — INSULIN ASPART 100 UNIT/ML IJ SOLN
0.0000 [IU] | Freq: Three times a day (TID) | INTRAMUSCULAR | Status: DC
Start: 2020-10-16 — End: 2020-10-18
  Administered 2020-10-16: 4 [IU] via SUBCUTANEOUS
  Administered 2020-10-17: 17:00:00 7 [IU] via SUBCUTANEOUS
  Administered 2020-10-17 – 2020-10-18 (×3): 4 [IU] via SUBCUTANEOUS
  Filled 2020-10-16 (×5): qty 1

## 2020-10-16 MED ORDER — ONDANSETRON HCL 4 MG/2ML IJ SOLN
4.0000 mg | Freq: Once | INTRAMUSCULAR | Status: AC
  Administered 2020-10-16: 4 mg via INTRAVENOUS
  Filled 2020-10-16: qty 2

## 2020-10-16 MED ORDER — LOSARTAN POTASSIUM 25 MG PO TABS
25.0000 mg | ORAL_TABLET | Freq: Every day | ORAL | Status: DC
  Administered 2020-10-16 – 2020-10-18 (×3): 25 mg via ORAL
  Filled 2020-10-16 (×3): qty 1

## 2020-10-16 MED ORDER — FUROSEMIDE 10 MG/ML IJ SOLN
40.0000 mg | Freq: Every day | INTRAMUSCULAR | Status: DC
  Administered 2020-10-17 – 2020-10-18 (×2): 40 mg via INTRAVENOUS
  Filled 2020-10-16 (×2): qty 4

## 2020-10-16 MED ORDER — SODIUM CHLORIDE 0.9% FLUSH: 3.0000 mL | INTRAVENOUS | Status: DC | PRN

## 2020-10-16 MED ORDER — FERROUS SULFATE 325 (65 FE) MG PO TABS
325.0000 mg | ORAL_TABLET | Freq: Every day | ORAL | Status: DC
  Administered 2020-10-17 – 2020-10-18 (×2): 325 mg via ORAL
  Filled 2020-10-16 (×2): qty 1

## 2020-10-16 MED ORDER — SODIUM CHLORIDE 0.9% FLUSH
3.0000 mL | Freq: Two times a day (BID) | INTRAVENOUS | Status: DC
  Administered 2020-10-16 – 2020-10-18 (×4): 3 mL via INTRAVENOUS

## 2020-10-16 MED ORDER — METOPROLOL TARTRATE 50 MG PO TABS
50.0000 mg | ORAL_TABLET | ORAL | Status: DC
  Administered 2020-10-16 – 2020-10-18 (×3): 50 mg via ORAL
  Filled 2020-10-16 (×3): qty 1

## 2020-10-16 MED ORDER — OXYCODONE HCL 5 MG PO TABS
5.0000 mg | ORAL_TABLET | Freq: Three times a day (TID) | ORAL | Status: DC | PRN
  Administered 2020-10-16 – 2020-10-17 (×2): 5 mg via ORAL
  Filled 2020-10-16 (×3): qty 1

## 2020-10-16 MED ORDER — PANTOPRAZOLE SODIUM 40 MG PO TBEC
40.0000 mg | DELAYED_RELEASE_TABLET | Freq: Every day | ORAL | Status: DC
  Administered 2020-10-16 – 2020-10-18 (×3): 40 mg via ORAL
  Filled 2020-10-16 (×3): qty 1

## 2020-10-16 NOTE — Plan of Care (Signed)
Though there are telemetry monitoring orders for this patient - unfortunately, there are no remaining telemetry boxes on the floor to use.  Secretary and Chrg aware and will will place telemetry on patient as one becomes available.

## 2020-10-16 NOTE — Plan of Care (Signed)
Pretty sure patient is having great difficulty with vision. When asked about it, she denied and refused help with multiple tasks. Yet, once daughter arrived, I informed them that I had placed PT/OT orders and SW to assist as needed.  Patient's response was "and when they realize I can't see, they'll leave me alone as they always do."    Patient reports to live alone - please assess carefully and see if we can help in any way.  Patient does report to have had an aide in the home before - but needs to restart.

## 2020-10-16 NOTE — ED Provider Notes (Signed)
[facility] Emergency Department Provider Note   ____________________________________________   Event Date/Time   First MD Initiated Contact with Patient 10/16/20 [phone]     (approximate)  I have reviewed the triage vital signs and the nursing notes.   HISTORY  Chief Complaint Chest Pain and Back Pain    HPI Patty Leon is a 60 y.o. female who comes in complaining of body aches.  She also has a headache at times.  She is wondering if she is getting enough oxygen.  She is chronically on 3 L due to her COPD.  She also had mid chest pain radiating through to the back that is been going on for a day or 2.  Currently the chest pain is gone she is having some back pain which feels better when I rub on her back.  Pain is 9 out of 10 it is not made worse by exertion.  It is not pleuritic.  Pain is made better by massaging or rubbing on it.           Past Medical History:  Diagnosis Date   Anxiety    Arthritis    COPD (chronic obstructive pulmonary disease) ([place])    Diabetes mellitus without complication ([place])    Diverticulosis    Fluid retention    GERD (gastroesophageal reflux disease)    History of blood clots    Hypercholesteremia    Hypertension    Obesity    Panic attacks    Shortness of breath dyspnea    Sleep apnea    Use C-PAP with oxygen    Patient Active Problem List   Diagnosis Date Noted   Chronic respiratory failure with hypoxia ([place]) 07/24/2020   Chronic anticoagulation 07/24/2020   History of DVT (deep vein thrombosis) 07/24/2020   COPD exacerbation ([place]) 07/24/2020   Obesity, Class III, BMI 40-49.9 (morbid obesity) ([place]) 07/24/2020   SIRS (systemic inflammatory response syndrome) ([place]) 06/03/2020   Diabetes ([place]) 06/03/2020   GERD (gastroesophageal reflux disease) 06/03/2020   Anxiety disorder 06/03/2020   Acute on chronic respiratory failure with hypoxia ([place]) 06/03/2020   COPD with acute exacerbation ([place]) 04/23/2017    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 ENDOSCOPY;  Service: Endoscopy;  Laterality: N/A;   COLONOSCOPY WITH PROPOFOL N/A 04/26/2018   Procedure: COLONOSCOPY WITH PROPOFOL;  Surgeon: Manya Silvas, MD;  Location: [facility] ENDOSCOPY;  Service: Endoscopy;  Laterality: N/A;   ESOPHAGOGASTRODUODENOSCOPY (EGD) WITH PROPOFOL N/A 04/26/2018   Procedure: ESOPHAGOGASTRODUODENOSCOPY (EGD) WITH PROPOFOL;  Surgeon: Manya Silvas, MD;  Location: [facility] ENDOSCOPY;  Service: Endoscopy;  Laterality: N/A;   JOINT REPLACEMENT Left 09/2015   KNEE ARTHROSCOPY Left    KNEE CLOSED REDUCTION Left 11/04/2015   Procedure: CLOSED MANIPULATION KNEE;  Surgeon: Hessie Knows, MD;  Location: [facility] ORS;  Service: Orthopedics;  Laterality: Left;   KNEE CLOSED REDUCTION Left 04/04/2016   Procedure: CLOSED MANIPULATION KNEE;  Surgeon: Hessie Knows, MD;  Location: [facility]  ORS;  Service: Orthopedics;  Laterality: Left;   SHOULDER ARTHROSCOPY W/ ROTATOR CUFF REPAIR Right    TOTAL KNEE ARTHROPLASTY Left 10/07/2015   Procedure: TOTAL KNEE ARTHROPLASTY;  Surgeon: Hessie Knows, MD;  Location: [facility] ORS;  Service: Orthopedics;  Laterality: Left;   TOTAL KNEE REVISION Left 02/08/2016   Procedure: TOTAL KNEE REVISION;  Surgeon: Hessie Knows, MD;  Location: [facility] ORS;  Service: Orthopedics;  Laterality: Left;   TUBAL LIGATION      Prior to Admission medications   Medication Sig Start Date End Date Taking?  Authorizing Provider  albuterol (VENTOLIN HFA) 108 (90 Base) MCG/ACT inhaler Inhale 2 puffs into the lungs every 6 (six) hours as needed for wheezing.    [provider]  amLODipine (NORVASC) 5 MG tablet Take 5 mg by mouth every morning.     [provider]  cetirizine (ZYRTEC) 10 MG tablet 10 mg daily.    [provider]  Cholecalciferol (VITAMIN D) 2000 units CAPS Take 2,000 Units by mouth daily.    [provider]  citalopram (CELEXA) 10 MG tablet Take 10 mg by mouth at bedtime. 02/15/20   [provider]  cyclobenzaprine (FLEXERIL) 10 MG tablet Take 0.5 tablets (5 mg total) by mouth 3 (three) times daily. 07/27/20   Nita Sells, MD  ferrous sulfate 325 (65 FE) MG tablet Take by mouth.    [provider]  guaiFENesin (MUCINEX) 600 MG 12 hr tablet Take 2 tablets (1,200 mg total) by mouth 2 (two) times daily. 07/27/20   Nita Sells, MD  hydrochlorothiazide (HYDRODIURIL) 25 MG tablet Take 0.5 tablets (12.5 mg total) by mouth daily. 07/27/20   Nita Sells, MD  JARDIANCE 25 MG TABS tablet Take 25 mg by mouth every morning. 06/23/20   [provider]  levofloxacin (LEVAQUIN) 750 MG tablet Take 1 tablet (750 mg total) by mouth daily. 07/28/20   Nita Sells, MD  losartan (COZAAR) 25 MG tablet Take 25 mg by mouth daily.    [provider]  metFORMIN (GLUCOPHAGE) 500 MG tablet Take 500 mg by mouth 2 (two) times daily. 07/26/15   [provider]  metoprolol (LOPRESSOR) 50 MG tablet Take 50 mg by mouth every morning.  08/29/15   [provider]  omeprazole (PRILOSEC) 40 MG capsule Take 40 mg by mouth every morning.  07/27/15   [provider]  oxyCODONE (OXY IR/ROXICODONE) 5 MG immediate release tablet Take 5 mg by mouth 3 (three) times daily as needed. 02/29/16   [provider]  OXYGEN Inhale into the lungs.    [provider]  polyethylene glycol powder  (GLYCOLAX/MIRALAX) powder Take 17 g by mouth daily as needed for mild constipation.  10/27/15   [provider]  potassium chloride SA (KLOR-CON) 20 MEQ tablet Take 2 tablets (40 mEq total) by mouth daily. 07/28/20   Nita Sells, MD  roflumilast (DALIRESP) 500 MCG TABS tablet Take 500 mcg by mouth every morning.     [provider]  rosuvastatin (CRESTOR) 10 MG tablet Take 10 mg by mouth every evening. 11/05/13   [provider]  TRELEGY ELLIPTA 100-62.5-25 MCG/INH AEPB Inhale into the lungs daily.    [provider]  XARELTO 20 MG TABS tablet Take 20 mg by mouth daily. 03/01/16   [provider]    Allergies Celebrex [celecoxib] and Dilaudid [hydromorphone]  Family History  Problem Relation Age of Onset   Lung cancer Mother    Ovarian cancer Paternal Grandmother  Breast cancer Neg Hx     Social History Social History   Tobacco Use   Smoking status: Every Day    Packs/day: 0.50    Years: 30.00    Pack years: 15.00    Types: Cigarettes   Smokeless tobacco: Never  Vaping Use   Vaping Use: Never used  Substance Use Topics   Alcohol use: Not Currently   Drug use: Not Currently    Types: Oxycodone    Comment: as prescribed by MD    Review of Systems Currently Constitutional: No fever/chills Eyes: No visual changes. ENT: No sore throat. Cardiovascular: Denies chest pain. Respiratory: shortness of breath. Gastrointestinal: No abdominal pain.  No nausea, no vomiting.  No diarrhea.  No constipation. Genitourinary: Negative for dysuria. Musculoskeletal: back pain. Skin: Negative for rash. Neurological: Negative for headaches, focal weakness  ____________________________________________   PHYSICAL EXAM:  VITAL SIGNS: ED Triage Vitals  Enc Vitals Group     BP 10/16/20 0606 123/74     Pulse Rate 10/16/20 0606 (!) 126     Resp 10/16/20 0606 (!) 24     Temp 10/16/20 0606 98.6 F (37 C)     Temp Source 10/16/20 0606  Oral     SpO2 10/16/20 0606 93 %     Weight --      Height --      Head Circumference --      Peak Flow --      Pain Score 10/16/20 0623 9     Pain Loc --      Pain Edu? --      Excl. in [place]? --     Constitutional: Alert and oriented. Well appearing and in no acute distress. Eyes: Conjunctivae are normal.  Head: Atraumatic. Nose: No congestion/rhinnorhea. Mouth/Throat: Mucous membranes are moist.  Oropharynx non-erythematous. Neck: No stridor.   Cardiovascular: Normal rate, regular rhythm. Grossly normal heart sounds.  Good peripheral circulation. Respiratory: Normal respiratory effort.  No retractions. Lungs distant breath sounds I do not hear any crackles however Gastrointestinal: Soft and nontender. No distention. No abdominal bruits.  Musculoskeletal: No lower extremity tenderness trace bilateral edema.   Neurologic:  Normal speech and language. No gross focal neurologic deficits are appreciated.  Skin:  Skin is warm, dry and intact. No rash noted.   ____________________________________________   LABS (all labs ordered are listed, but only abnormal results are displayed)  Labs Reviewed  BASIC METABOLIC PANEL - Abnormal; Notable for the following components:      Result Value   Potassium 3.0 (*)    Chloride 86 (*)    CO2 40 (*)    Glucose, Bld 177 (*)    All other components within normal limits  CBC - Abnormal; Notable for the following components:   WBC 10.9 (*)    Hemoglobin 15.3 (*)    HCT 49.4 (*)    All other components within normal limits  BRAIN NATRIURETIC PEPTIDE - Abnormal; Notable for the following components:   B Natriuretic Peptide 242.8 (*)    All other components within normal limits  PROTIME-INR  TROPONIN I (HIGH SENSITIVITY)  TROPONIN I (HIGH SENSITIVITY)   ____________________________________________  EKG  EKG read interpreted by me shows sinus tach at a rate of 115 PR interval somewhat short at 108 ms nonspecific ST-T wave  changes ____________________________________________  RADIOLOGY Gertha Calkin, personally viewed and evaluated these images (plain radiographs) as part of my medical decision making, as well as reviewing the written report  by the radiologist.  ED MD interpretation: Chest x-ray read by radiology reviewed by me does show what appears to be congestive heart failure.  Aorta does not appear to be enlarged at all.  Official radiology report(s): DG Chest 2 View  Result Date: 10/16/2020 CLINICAL DATA:  60 year old female with history of chest pain. EXAM: CHEST - 2 VIEW COMPARISON:  Chest x-ray 07/24/2020. FINDINGS: Lung volumes are normal. No consolidative airspace disease. No pleural effusions. No pneumothorax. No pulmonary nodule or mass noted. Cephalization of the pulmonary vasculature, without frank pulmonary edema. Heart size is borderline enlarged. Upper mediastinal contours are within normal limits. IMPRESSION: 1. Borderline cardiomegaly with pulmonary venous congestion, but no frank pulmonary edema. Electronically Signed   By: Vinnie Langton M.D.   On: 10/16/2020 07:14    ____________________________________________   PROCEDURES  Procedure(s) performed (including Critical Care): Critical care time 20 minutes this includes reviewing the patient's labs EKG x-ray previous EKGs and x-rays and old records and talking to the hospitalist.  Procedures   ____________________________________________   INITIAL IMPRESSION / ASSESSMENT AND PLAN / ED COURSE  Patient walks back and forth in front of the bed with oxygen on and desats down to 83% on 3 L of oxygen.  Because the patient's pain is 9 out of 10 and was radiating from the front through to the back she has a history of hypertension new onset CHF and a history of being on blood thinners I will evaluate her to make sure she is not having any dissection.  She has claustrophobia as well so I will give her some Ativan on-call to the CT  scan.          ____________________________________________   FINAL CLINICAL IMPRESSION(S) / ED DIAGNOSES  Final diagnoses:  Dyspnea on exertion  Hypoxia  Systolic congestive heart failure, unspecified HF chronicity [facility])     ED Discharge Orders          Ordered    CT ANGIO CHEST AORTA W/CM & OR WO/CM        10/16/20 [id number]             Note:  This document was prepared using Dragon voice recognition software and may include unintentional dictation errors.    Nena Polio, MD 10/16/20 [phone]

## 2020-10-16 NOTE — H&P (Addendum)
Is more tender last  Please have been she was expecting.  She has and is unable to accept that swelling was not a candidate for 20 her History and Physical    Patty Leon [id number] DOB: September 18, 1960 DOA: 10/16/2020  PCP: Danelle Berry, NP   Patient coming from: Home  I have personally briefly reviewed patient's old medical records in [place]  Chief Complaint: Chest pain  HPI: Patty Leon is a 60 y.o. female with medical history significant for morbid obesity, COPD, nicotine dependence, diabetes mellitus, hypertension and dyslipidemia who presents to the ER for evaluation of a 3-day history of intermittent midsternal chest pain with radiation to the back.  At the time of my evaluation she is chest pain-free but continues to complain of pain in between both scapula.  Pain improves with palpation of the area.  She states that her pain has not been related to rest or exertion and sometimes occurs while she is sleeping. She also has worsening shortness of breath from her baseline despite being on her 3 L of oxygen continuous.  She has bilateral lower extremity swelling as well as 3 pillow orthopnea. She denies having any cough, no fever, no chills, no sore throat, no nausea, no vomiting, no changes in her bowel habits or urinary symptoms. She denies having any sick contacts and received her COVID booster about a week ago. She also complains of severe intermittent headaches mostly in the occipital area.  She denies having a known history of migraine headaches. Labs show sodium 141, potassium 3.0, chloride 86, bicarb 40, glucose 177, BUN 12, creatinine 0.69, calcium 9.2, BNP 242, troponin 10, white count 10.9, hemoglobin 15.3, hematocrit 49.4, MCV 99.8, RDW 13.7, platelet count 277, PT 13.0, INR 1.0 Chest x-ray reviewed by me shows borderline cardiomegaly with pulmonary venous congestion, but no frank pulmonary edema. CT angiography chest with contrast shows  no evidence for aortic  dissection or aneurysm. Bronchial wall thickening and emphysematous changes. Aortic Atherosclerosis (ICD10-I70.0).Coronary artery calcifications.Enlarged thyroid gland. 5. 4 millimeter nodule within the RIGHT thyroid lobe. Not clinically significant; no follow-up imaging recommended. Twelve-lead EKG reviewed by me shows sinus tachycardia with short PR interval    ED Course: Patient is a 60 year old African-American female with a history of COPD and chronic respiratory 3 L of oxygen, diabetes mellitus and hypertension who presents to the ER for evaluation of chest pain with radiation to the back associated with worsening shortness of breath, bilateral lower extremity swelling and 3 pillow orthopnea. She will be admitted to the hospital for further evaluation.    Review of Systems: As per HPI otherwise all other systems reviewed and negative.    Past Medical History:  Diagnosis Date   Anxiety    Arthritis    COPD (chronic obstructive pulmonary disease) ([place])    Diabetes mellitus without complication ([place])    Diverticulosis    Fluid retention    GERD (gastroesophageal reflux disease)    History of blood clots    Hypercholesteremia    Hypertension    Obesity    Panic attacks    Shortness of breath dyspnea    Sleep apnea    Use C-PAP with oxygen    Past Surgical History:  Procedure Laterality Date   BILATERAL CARPAL TUNNEL RELEASE     COLONOSCOPY     COLONOSCOPY N/A 01/22/2015   Procedure: COLONOSCOPY;  Surgeon: Josefine Class, MD;  Location: [facility] ENDOSCOPY;  Service: Endoscopy;  Laterality: N/A;  COLONOSCOPY WITH PROPOFOL N/A 07/12/2016   Procedure: COLONOSCOPY WITH PROPOFOL;  Surgeon: Manya Silvas, MD;  Location: [facility] ENDOSCOPY;  Service: Endoscopy;  Laterality: N/A;   COLONOSCOPY WITH PROPOFOL N/A 04/26/2018   Procedure: COLONOSCOPY WITH PROPOFOL;  Surgeon: Manya Silvas, MD;  Location: [facility] ENDOSCOPY;  Service: Endoscopy;  Laterality: N/A;    ESOPHAGOGASTRODUODENOSCOPY (EGD) WITH PROPOFOL N/A 04/26/2018   Procedure: ESOPHAGOGASTRODUODENOSCOPY (EGD) WITH PROPOFOL;  Surgeon: Manya Silvas, MD;  Location: [facility] ENDOSCOPY;  Service: Endoscopy;  Laterality: N/A;   JOINT REPLACEMENT Left 09/2015   KNEE ARTHROSCOPY Left    KNEE CLOSED REDUCTION Left 11/04/2015   Procedure: CLOSED MANIPULATION KNEE;  Surgeon: Hessie Knows, MD;  Location: [facility] ORS;  Service: Orthopedics;  Laterality: Left;   KNEE CLOSED REDUCTION Left 04/04/2016   Procedure: CLOSED MANIPULATION KNEE;  Surgeon: Hessie Knows, MD;  Location: [facility] ORS;  Service: Orthopedics;  Laterality: Left;   SHOULDER ARTHROSCOPY W/ ROTATOR CUFF REPAIR Right    TOTAL KNEE ARTHROPLASTY Left 10/07/2015   Procedure: TOTAL KNEE ARTHROPLASTY;  Surgeon: Hessie Knows, MD;  Location: [facility] ORS;  Service: Orthopedics;  Laterality: Left;   TOTAL KNEE REVISION Left 02/08/2016   Procedure: TOTAL KNEE REVISION;  Surgeon: Hessie Knows, MD;  Location: [facility] ORS;  Service: Orthopedics;  Laterality: Left;   TUBAL LIGATION       reports that she has been smoking cigarettes. She has a 15.00 pack-year smoking history. She has never used smokeless tobacco. She reports previous alcohol use. She reports previous drug use. Drug: Oxycodone.  Allergies  Allergen Reactions   Celebrex [Celecoxib] Swelling   Dilaudid [Hydromorphone] Itching    Family History  Problem Relation Age of Onset   Lung cancer Mother    Ovarian cancer Paternal Grandmother    Breast cancer Neg Hx       Prior to Admission medications   Medication Sig Start Date End Date Taking? Authorizing Provider  albuterol (VENTOLIN HFA) 108 (90 Base) MCG/ACT inhaler Inhale 2 puffs into the lungs every 6 (six) hours as needed for wheezing.   Yes [provider]  amLODipine (NORVASC) 5 MG tablet Take 5 mg by mouth every morning.    Yes [provider]  cetirizine (ZYRTEC) 10 MG tablet 10 mg daily.   Yes [provider]   Cholecalciferol (VITAMIN D) 2000 units CAPS Take 2,000 Units by mouth daily.   Yes [provider]  docusate sodium (COLACE) 100 MG capsule Take 100 mg by mouth daily. 10/01/20  Yes [provider]  doxycycline (VIBRAMYCIN) 100 MG capsule Take 100 capsules by mouth 2 (two) times daily. Take 100 mg (1 capsule) by mouth twice daily for 10 days 10/08/20 10/18/20 Yes [provider]  ferrous sulfate 325 (65 FE) MG tablet Take by mouth.   Yes [provider]  hydrochlorothiazide (HYDRODIURIL) 25 MG tablet Take 0.5 tablets (12.5 mg total) by mouth daily. 07/27/20  Yes Nita Sells, MD  JARDIANCE 25 MG TABS tablet Take 25 mg by mouth every morning. 06/23/20  Yes [provider]  losartan (COZAAR) 25 MG tablet Take 25 mg by mouth daily.   Yes [provider]  metFORMIN (GLUCOPHAGE) 500 MG tablet Take 500 mg by mouth 2 (two) times daily. 07/26/15  Yes [provider]  metoprolol (LOPRESSOR) 50 MG tablet Take 50 mg by mouth every morning.  08/29/15  Yes [provider]  nicotine (NICODERM CQ - DOSED IN MG/24 HOURS) 21 mg/24hr patch 21 mg daily. 08/29/20  Yes  [provider]  omeprazole (PRILOSEC) 40 MG capsule Take 40 mg by mouth every morning.  07/27/15  Yes [provider]  oxyCODONE (OXY IR/ROXICODONE) 5 MG immediate release tablet Take 5 mg by mouth 3 (three) times daily as needed. 02/29/16  Yes [provider]  potassium chloride SA (KLOR-CON) 20 MEQ tablet Take 2 tablets (40 mEq total) by mouth daily. 07/28/20  Yes Nita Sells, MD  roflumilast (DALIRESP) 500 MCG TABS tablet Take 500 mcg by mouth every morning.    Yes [provider]  rosuvastatin (CRESTOR) 10 MG tablet Take 10 mg by mouth every evening. 11/05/13  Yes [provider]  TRELEGY ELLIPTA 100-62.5-25 MCG/INH AEPB Inhale into the lungs daily.   Yes [provider]  XARELTO 20 MG TABS tablet Take 20 mg by mouth  daily. 03/01/16  Yes [provider]  citalopram (CELEXA) 10 MG tablet Take 10 mg by mouth at bedtime. 02/15/20   [provider]  cyclobenzaprine (FLEXERIL) 10 MG tablet Take 0.5 tablets (5 mg total) by mouth 3 (three) times daily. Patient not taking: No sig reported 07/27/20   Nita Sells, MD  guaiFENesin (MUCINEX) 600 MG 12 hr tablet Take 2 tablets (1,200 mg total) by mouth 2 (two) times daily. Patient not taking: No sig reported 07/27/20   Nita Sells, MD  levofloxacin (LEVAQUIN) 750 MG tablet Take 1 tablet (750 mg total) by mouth daily. Patient not taking: No sig reported 07/28/20   Nita Sells, MD  OXYGEN Inhale into the lungs.    [provider]  polyethylene glycol powder (GLYCOLAX/MIRALAX) powder Take 17 g by mouth daily as needed for mild constipation.  Patient not taking: Reported on 10/16/2020 10/27/15   [provider]    Physical Exam: Vitals:   10/16/20 0732 10/16/20 0907 10/16/20 1100 10/16/20 1109  BP: 126/69 119/62 135/71   Pulse: (!) 107  (!) 104   Resp: (!) 23  (!) 22   Temp:      TempSrc:      SpO2: 97%   94%     Vitals:   10/16/20 0732 10/16/20 0907 10/16/20 1100 10/16/20 1109  BP: 126/69 119/62 135/71   Pulse: (!) 107  (!) 104   Resp: (!) 23  (!) 22   Temp:      TempSrc:      SpO2: 97%   94%      Constitutional: Alert and oriented x 3 . Not in any apparent distress. Obese HEENT:      Head: Normocephalic and atraumatic.         Eyes: PERLA, EOMI, Conjunctivae are normal. Sclera is non-icteric.       Mouth/Throat: Mucous membranes are moist.       Neck: Supple with no signs of meningismus. Cardiovascular: Regular rate and rhythm. No murmurs, gallops, or rubs. 2+ symmetrical distal pulses are present . No JVD. + LE edema Respiratory: Respiratory effort normal . Bilateral air entry. Scattered wheezes with faint crackles.  Gastrointestinal: Soft, non tender, and non distended with positive bowel  sounds.  Genitourinary: No CVA tenderness. Musculoskeletal: Nontender with normal range of motion in all extremities. No cyanosis, or erythema of extremities. Neurologic:  Face is symmetric. Moving all extremities. No gross focal neurologic deficits . Skin: Skin is warm, dry.  No rash or ulcers Psychiatric: Mood and affect are normal    Labs on Admission: I have personally reviewed following labs and imaging studies  CBC: Recent Labs  Lab 10/16/20 [phone]  WBC 10.9*  HGB 15.3*  HCT 49.4*  MCV 99.8  PLT [id number]   Basic Metabolic Panel: Recent Labs  Lab 10/16/20 0608  NA 141  K 3.0*  CL 86*  CO2 40*  GLUCOSE 177*  BUN 12  CREATININE 0.63  CALCIUM 9.2   GFR: CrCl cannot be calculated (Unknown ideal weight.). Liver Function Tests: No results for input(s): AST, ALT, ALKPHOS, BILITOT, PROT, ALBUMIN in the last 168 hours. No results for input(s): LIPASE, AMYLASE in the last 168 hours. No results for input(s): AMMONIA in the last 168 hours. Coagulation Profile: Recent Labs  Lab 10/16/20 0608  INR 1.0   Cardiac Enzymes: No results for input(s): CKTOTAL, CKMB, CKMBINDEX, TROPONINI in the last 168 hours. BNP (last 3 results) No results for input(s): PROBNP in the last 8760 hours. HbA1C: No results for input(s): HGBA1C in the last 72 hours. CBG: No results for input(s): GLUCAP in the last 168 hours. Lipid Profile: No results for input(s): CHOL, HDL, LDLCALC, TRIG, CHOLHDL, LDLDIRECT in the last 72 hours. Thyroid Function Tests: No results for input(s): TSH, T4TOTAL, FREET4, T3FREE, THYROIDAB in the last 72 hours. Anemia Panel: No results for input(s): VITAMINB12, FOLATE, FERRITIN, TIBC, IRON, RETICCTPCT in the last 72 hours. Urine analysis:    Component Value Date/Time   COLORURINE YELLOW (A) 04/25/2017 0651   APPEARANCEUR CLEAR (A) 04/25/2017 0651   APPEARANCEUR Clear 09/03/2012 0739   LABSPEC 1.011 04/25/2017 0651   LABSPEC 1.005 09/03/2012 0739   PHURINE 6.0  04/25/2017 0651   GLUCOSEU NEGATIVE 04/25/2017 0651   GLUCOSEU Negative 09/03/2012 0739   HGBUR MODERATE (A) 04/25/2017 0651   BILIRUBINUR NEGATIVE 04/25/2017 0651   BILIRUBINUR Negative 09/03/2012 0739   KETONESUR NEGATIVE 04/25/2017 0651   PROTEINUR NEGATIVE 04/25/2017 0651   NITRITE NEGATIVE 04/25/2017 0651   LEUKOCYTESUR NEGATIVE 04/25/2017 0651   LEUKOCYTESUR Negative 09/03/2012 0739    Radiological Exams on Admission: DG Chest 2 View  Result Date: 10/16/2020 CLINICAL DATA:  60 year old female with history of chest pain. EXAM: CHEST - 2 VIEW COMPARISON:  Chest x-ray 07/24/2020. FINDINGS: Lung volumes are normal. No consolidative airspace disease. No pleural effusions. No pneumothorax. No pulmonary nodule or mass noted. Cephalization of the pulmonary vasculature, without frank pulmonary edema. Heart size is borderline enlarged. Upper mediastinal contours are within normal limits. IMPRESSION: 1. Borderline cardiomegaly with pulmonary venous congestion, but no frank pulmonary edema. Electronically Signed   By: Vinnie Langton M.D.   On: 10/16/2020 07:14   CT ANGIO CHEST AORTA W/CM &/OR WO/CM  Result Date: 10/16/2020 CLINICAL DATA:  Mid chest pain radiates to back. EXAM: CT ANGIOGRAPHY CHEST WITH CONTRAST TECHNIQUE: Multidetector CT imaging of the chest was performed using the standard protocol during bolus administration of intravenous contrast. Multiplanar CT image reconstructions and MIPs were obtained to evaluate the vascular anatomy. CONTRAST:  173m OMNIPAQUE IOHEXOL 350 MG/ML SOLN COMPARISON:  CT 02/21/2010 and chest x-ray today FINDINGS: Cardiovascular: Heart size is normal. There are coronary artery calcifications. No pericardial effusion. Bolus timing optimizes opacification of the aorta. There is minimal atherosclerotic calcification of the aorta. No aneurysm or evidence for dissection. Pulmonary arteries are only moderately well opacified. No large or central pulmonary emboli  identified. Mediastinum/Nodes: Thyroid gland is prominent. Small nodule in the LOWER pole of the RIGHT thyroid is 4 millimeters. No significant mediastinal, hilar, or axillary adenopathy. Lungs/Pleura: There is bronchial wall thickening. Airways are patent. There are scattered centrilobular emphysematous changes primarily within the UPPER lobes. No focal consolidations or pleural  effusions. No pulmonary nodules. Upper Abdomen: No acute abnormality identified in the UPPER abdomen. Mild prominence of the LEFT adrenal gland shows long-term stability (2011) Musculoskeletal: There are diffuse degenerative changes throughout the thoracic spine. No lytic or blastic lesions. Review of the MIP images confirms the above findings. IMPRESSION: 1. No evidence for aortic dissection or aneurysm. 2. Bronchial wall thickening and emphysematous changes. Aortic Atherosclerosis (ICD10-I70.0). 3. Coronary artery calcifications. 4. Enlarged thyroid gland. 5. 4 millimeter nodule within the RIGHT thyroid lobe. Not clinically significant; no follow-up imaging recommended (ref: J Am Coll Radiol. 2015 Feb;12(2): 143-50). Electronically Signed   By: Nolon Nations M.D.   On: 10/16/2020 10:25     Assessment/Plan Principal Problem:   COPD with acute exacerbation (HCC) Active Problems:   Diabetes (HCC)   GERD (gastroesophageal reflux disease)   Obstructive sleep apnea   History of DVT (deep vein thrombosis)   Obesity, Class III, BMI 40-49.9 (morbid obesity) ([place])   Nicotine dependence     COPD with acute exacerbation /chronic respiratory failure Place patient on inhaled and systemic steroids Continue oxygen supplementation at 3 L which is patient's baseline to maintain pulse oximetry greater than 92%    Acute CHF Unclear etiology Obtain 2D echocardiogram to assess LVEF Place patient on Lasix 40 mg IV daily Continue metoprolol and losartan    Morbid obesity (BMI > 40) with complications of obstructive sleep  apnea Complicates overall prognosis and care Continue CPAP at bedtime    Nicotine dependence Smoking cessation has been discussed with patient in detail she declines a nicotine transdermal patch at this time    History of DVT Continue Xarelto    Diabetes mellitus Hold oral hypoglycemic agents Maintain consistent carbohydrate diet Glycemic control with sliding scale insulin    Chest pain Atypical Serial cardiac enzymes are within normal limits CT angiogram does not show any acute findings.  DVT prophylaxis: Xarelto Code Status: full code  Family Communication: Greater than 50% of time was spent discussing patient's condition and plan of care with her at the bedside.  All questions and concerns have been addressed.  She verbalizes understanding and agrees to the plan. Disposition Plan: Back to previous home environment Consults called: none  Status:Observation    Sharika Mosquera MD [facility]     10/16/2020, 12:27 PM

## 2020-10-16 NOTE — ED Triage Notes (Signed)
Pt to ED from home c/o mid chest pain radiating to back.  Also states neck pain and body aches, headaches, had COVID booster last week.  Denies n/v/d, cough, sore throat, or fevers.  States wears 3L [place] chronically at home.  Pt A&Ox4, chest rise even and unlabored, skin WNL, in NAD at this time.

## 2020-10-17 ENCOUNTER — Observation Stay: Admit: 2020-10-17 | Payer: Medicaid Other

## 2020-10-17 DIAGNOSIS — J441 Chronic obstructive pulmonary disease with (acute) exacerbation: Secondary | ICD-10-CM | POA: Diagnosis present

## 2020-10-17 DIAGNOSIS — I517 Cardiomegaly: Secondary | ICD-10-CM | POA: Diagnosis present

## 2020-10-17 DIAGNOSIS — Z96653 Presence of artificial knee joint, bilateral: Secondary | ICD-10-CM | POA: Diagnosis present

## 2020-10-17 DIAGNOSIS — F17210 Nicotine dependence, cigarettes, uncomplicated: Secondary | ICD-10-CM | POA: Diagnosis present

## 2020-10-17 DIAGNOSIS — E7800 Pure hypercholesterolemia, unspecified: Secondary | ICD-10-CM | POA: Diagnosis present

## 2020-10-17 DIAGNOSIS — F419 Anxiety disorder, unspecified: Secondary | ICD-10-CM | POA: Diagnosis present

## 2020-10-17 DIAGNOSIS — J9600 Acute respiratory failure, unspecified whether with hypoxia or hypercapnia: Secondary | ICD-10-CM | POA: Diagnosis present

## 2020-10-17 DIAGNOSIS — Z86718 Personal history of other venous thrombosis and embolism: Secondary | ICD-10-CM | POA: Diagnosis not present

## 2020-10-17 DIAGNOSIS — I2510 Atherosclerotic heart disease of native coronary artery without angina pectoris: Secondary | ICD-10-CM | POA: Diagnosis present

## 2020-10-17 DIAGNOSIS — K219 Gastro-esophageal reflux disease without esophagitis: Secondary | ICD-10-CM | POA: Diagnosis present

## 2020-10-17 DIAGNOSIS — E119 Type 2 diabetes mellitus without complications: Secondary | ICD-10-CM | POA: Diagnosis present

## 2020-10-17 DIAGNOSIS — Z7901 Long term (current) use of anticoagulants: Secondary | ICD-10-CM | POA: Diagnosis not present

## 2020-10-17 DIAGNOSIS — I509 Heart failure, unspecified: Secondary | ICD-10-CM

## 2020-10-17 DIAGNOSIS — G4733 Obstructive sleep apnea (adult) (pediatric): Secondary | ICD-10-CM | POA: Diagnosis present

## 2020-10-17 DIAGNOSIS — I10 Essential (primary) hypertension: Secondary | ICD-10-CM | POA: Diagnosis present

## 2020-10-17 DIAGNOSIS — F410 Panic disorder [episodic paroxysmal anxiety] without agoraphobia: Secondary | ICD-10-CM | POA: Diagnosis present

## 2020-10-17 DIAGNOSIS — J9611 Chronic respiratory failure with hypoxia: Secondary | ICD-10-CM | POA: Diagnosis present

## 2020-10-17 DIAGNOSIS — I700 Atherosclerosis of aorta: Secondary | ICD-10-CM | POA: Diagnosis present

## 2020-10-17 DIAGNOSIS — Z79899 Other long term (current) drug therapy: Secondary | ICD-10-CM | POA: Diagnosis not present

## 2020-10-17 DIAGNOSIS — Z7984 Long term (current) use of oral hypoglycemic drugs: Secondary | ICD-10-CM | POA: Diagnosis not present

## 2020-10-17 DIAGNOSIS — G5603 Carpal tunnel syndrome, bilateral upper limbs: Secondary | ICD-10-CM | POA: Diagnosis present

## 2020-10-17 DIAGNOSIS — Z20822 Contact with and (suspected) exposure to covid-19: Secondary | ICD-10-CM | POA: Diagnosis present

## 2020-10-17 DIAGNOSIS — Z9981 Dependence on supplemental oxygen: Secondary | ICD-10-CM | POA: Diagnosis not present

## 2020-10-17 DIAGNOSIS — Z6841 Body Mass Index (BMI) 40.0 and over, adult: Secondary | ICD-10-CM | POA: Diagnosis not present

## 2020-10-17 DIAGNOSIS — Z888 Allergy status to other drugs, medicaments and biological substances status: Secondary | ICD-10-CM | POA: Diagnosis not present

## 2020-10-17 HISTORY — DX: Heart failure, unspecified: I50.9

## 2020-10-17 LAB — GLUCOSE, CAPILLARY
Glucose-Capillary: 173 mg/dL — ABNORMAL HIGH (ref 70–99)
Glucose-Capillary: 175 mg/dL — ABNORMAL HIGH (ref 70–99)
Glucose-Capillary: 212 mg/dL — ABNORMAL HIGH (ref 70–99)
Glucose-Capillary: 158 mg/dL — ABNORMAL HIGH (ref 70–99)

## 2020-10-17 MED ORDER — BUTALBITAL-APAP-CAFFEINE 50-325-40 MG PO TABS
1.0000 | ORAL_TABLET | Freq: Four times a day (QID) | ORAL | Status: DC | PRN
  Administered 2020-10-17 (×2): 1 via ORAL
  Filled 2020-10-17 (×2): qty 1

## 2020-10-17 NOTE — Progress Notes (Signed)
PROGRESS NOTE    Patty Leon  [id number] DOB: 09-01-1960 DOA: 10/16/2020 PCP: Danelle Berry, NP  119A/119A-AA   Assessment & Plan:   Principal Problem:   COPD with acute exacerbation ([place]) Active Problems:   Diabetes ([place])   GERD (gastroesophageal reflux disease)   Obstructive sleep apnea   History of DVT (deep vein thrombosis)   Obesity, Class III, BMI 40-49.9 (morbid obesity) ([place])   Nicotine dependence   Acute CHF (congestive heart failure) (HCC)   Chest pain   Acute exacerbation of CHF (congestive heart failure) (HCC)   Patty Leon is a 60 y.o. female with medical history significant for morbid obesity, COPD on 3L home O2, nicotine dependence, diabetes mellitus, hypertension and dyslipidemia who presents to the ER for evaluation of a 3-day history of intermittent midsternal chest pain with radiation to the back.     COPD, exacerbation ruled out chronic respiratory failure on 3L home O2 --pt was not started on steroid.  On exam, no wheezing. Plan: --cont daily bronchodilators  --cont home 3L O2   Presumed acute CHF exacerbation --pt reported increased BLE swelling and dyspnea.  CXR showed Borderline cardiomegaly with pulmonary venous congestion.   --No Echo on record --started on IV lasix with reported good urine output. Plan: --cont diuresis --Strict I/O --Echo   Morbid obesity (BMI > 40)   obstructive sleep apnea on home BiPAP --order BiPAP nightly   Nicotine dependence Smoking cessation has been discussed with patient in detail she declines a nicotine transdermal patch at this time   History of DVT Continue Xarelto   Diabetes mellitus Hold oral hypoglycemic agents Maintain consistent  --SSI   Chest pain, ACS ruled out Serial cardiac enzymes are within normal limits CT angiogram No evidence for aortic dissection or aneurysm. --CP resolved   DVT prophylaxis: [id number]  Code Status: Full code  Family Communication: sister updated at  bedside today Level of care: Med-Surg Dispo:   The patient is from: home Anticipated d/c is to: home Anticipated d/c date is: likely tomorrow Patient currently is not medically ready to d/c due to: IV lasix   Subjective and Interval History:  Family extremely concerned about pt's lethargy this morning and wanted head CT to check for stroke.    Pt was alert enough to work with PT and answer questions during rounds.  Complained of headache.  CP resolved.  Reported increased swelling.  Some dyspnea.     Objective: Vitals:   10/17/20 1548 10/17/20 1938 10/17/20 2051 10/17/20 2310  BP: (!) 118/59 (!) 121/59  (!) 143/77  Pulse: 89 93  (!) 105  Resp: '16 18  18  '$ Temp: 98 F (36.7 C) 97.8 F (36.6 C)  (!) 97.4 F (36.3 C)  TempSrc: Oral Oral  Axillary  SpO2: 97% 93% 100% 100%  Weight:      Height:        Intake/Output Summary (Last 24 hours) at 10/18/2020 0239 Last data filed at 10/17/2020 1300 Gross per 24 hour  Intake 0 ml  Output 1200 ml  Net -1200 ml   Filed Weights   10/16/20 1358  Weight: 134.4 kg    Examination:   Constitutional: NAD, AAOx3, sleepy but answering questions appropriately  HEENT: conjunctivae and lids normal, EOMI CV: No cyanosis.   RESP: No wheezes, poor effort, on 3L Extremities: mild non-pitting edema in BLE SKIN: warm, dry Neuro: II - XII grossly intact.  No focal neuro deficit.   Data Reviewed: I have personally  reviewed following labs and imaging studies  CBC: Recent Labs  Lab 10/16/20 0608  WBC 10.9*  HGB 15.3*  HCT 49.4*  MCV 99.8  PLT [id number]   Basic Metabolic Panel: Recent Labs  Lab 10/16/20 0608  NA 141  K 3.0*  CL 86*  CO2 40*  GLUCOSE 177*  BUN 12  CREATININE 0.63  CALCIUM 9.2   GFR: Estimated Creatinine Clearance: 101.4 mL/min (by C-G formula based on SCr of 0.63 mg/dL). Liver Function Tests: No results for input(s): AST, ALT, ALKPHOS, BILITOT, PROT, ALBUMIN in the last 168 hours. No results for input(s): LIPASE,  AMYLASE in the last 168 hours. No results for input(s): AMMONIA in the last 168 hours. Coagulation Profile: Recent Labs  Lab 10/16/20 0608  INR 1.0   Cardiac Enzymes: No results for input(s): CKTOTAL, CKMB, CKMBINDEX, TROPONINI in the last 168 hours. BNP (last 3 results) No results for input(s): PROBNP in the last 8760 hours. HbA1C: No results for input(s): HGBA1C in the last 72 hours. CBG: Recent Labs  Lab 10/16/20 2128 10/17/20 0716 10/17/20 1137 10/17/20 1548 10/17/20 2114  GLUCAP 177* 173* 175* 212* 158*   Lipid Profile: No results for input(s): CHOL, HDL, LDLCALC, TRIG, CHOLHDL, LDLDIRECT in the last 72 hours. Thyroid Function Tests: No results for input(s): TSH, T4TOTAL, FREET4, T3FREE, THYROIDAB in the last 72 hours. Anemia Panel: No results for input(s): VITAMINB12, FOLATE, FERRITIN, TIBC, IRON, RETICCTPCT in the last 72 hours. Sepsis Labs: No results for input(s): PROCALCITON, LATICACIDVEN in the last 168 hours.  Recent Results (from the past 240 hour(s))  Resp Panel by RT-PCR (Flu A&B, Covid) Nasopharyngeal Swab     Status: None   Collection Time: 10/16/20 10:18 AM   Specimen: Nasopharyngeal Swab; Nasopharyngeal(NP) swabs in vial transport medium  Result Value Ref Range Status   SARS Coronavirus 2 by RT PCR NEGATIVE NEGATIVE Final    Comment: (NOTE) SARS-CoV-2 target nucleic acids are NOT DETECTED.  The SARS-CoV-2 RNA is generally detectable in upper respiratory specimens during the acute phase of infection. The lowest concentration of SARS-CoV-2 viral copies this assay can detect is 138 copies/mL. A negative result does not preclude SARS-Cov-2 infection and should not be used as the sole basis for treatment or other patient management decisions. A negative result may occur with  improper specimen collection/handling, submission of specimen other than nasopharyngeal swab, presence of viral mutation(s) within the areas targeted by this assay, and inadequate  number of viral copies(<138 copies/mL). A negative result must be combined with clinical observations, patient history, and epidemiological information. The expected result is Negative.  Fact Sheet for Patients:  EntrepreneurPulse.com.au  Fact Sheet for Healthcare Providers:  IncredibleEmployment.be  This test is no t yet approved or cleared by the [place] FDA and  has been authorized for detection and/or diagnosis of SARS-CoV-2 by FDA under an Emergency Use Authorization (EUA). This EUA will remain  in effect (meaning this test can be used) for the duration of the COVID-19 declaration under Section 564(b)(1) of the Act, 21 U.S.C.section 360bbb-3(b)(1), unless the authorization is terminated  or revoked sooner.       Influenza A by PCR NEGATIVE NEGATIVE Final   Influenza B by PCR NEGATIVE NEGATIVE Final    Comment: (NOTE) The Xpert Xpress SARS-CoV-2/FLU/RSV plus assay is intended as an aid in the diagnosis of influenza from Nasopharyngeal swab specimens and should not be used as a sole basis for treatment. Nasal washings and aspirates are unacceptable for Xpert Xpress SARS-CoV-2/FLU/RSV testing.  Fact Sheet for Patients: EntrepreneurPulse.com.au  Fact Sheet for Healthcare Providers: IncredibleEmployment.be  This test is not yet approved or cleared by the [place] FDA and has been authorized for detection and/or diagnosis of SARS-CoV-2 by FDA under an Emergency Use Authorization (EUA). This EUA will remain in effect (meaning this test can be used) for the duration of the COVID-19 declaration under Section 564(b)(1) of the Act, 21 U.S.C. section 360bbb-3(b)(1), unless the authorization is terminated or revoked.  Performed at [facility], [address], [place], [place] [address]       Radiology Studies: DG Chest 2 View  Result Date: 10/16/2020 CLINICAL DATA:  60 year old  female with history of chest pain. EXAM: CHEST - 2 VIEW COMPARISON:  Chest x-ray 07/24/2020. FINDINGS: Lung volumes are normal. No consolidative airspace disease. No pleural effusions. No pneumothorax. No pulmonary nodule or mass noted. Cephalization of the pulmonary vasculature, without frank pulmonary edema. Heart size is borderline enlarged. Upper mediastinal contours are within normal limits. IMPRESSION: 1. Borderline cardiomegaly with pulmonary venous congestion, but no frank pulmonary edema. Electronically Signed   By: Vinnie Langton M.D.   On: 10/16/2020 07:14   CT ANGIO CHEST AORTA W/CM &/OR WO/CM  Result Date: 10/16/2020 CLINICAL DATA:  Mid chest pain radiates to back. EXAM: CT ANGIOGRAPHY CHEST WITH CONTRAST TECHNIQUE: Multidetector CT imaging of the chest was performed using the standard protocol during bolus administration of intravenous contrast. Multiplanar CT image reconstructions and MIPs were obtained to evaluate the vascular anatomy. CONTRAST:  160m OMNIPAQUE IOHEXOL 350 MG/ML SOLN COMPARISON:  CT 02/21/2010 and chest x-ray today FINDINGS: Cardiovascular: Heart size is normal. There are coronary artery calcifications. No pericardial effusion. Bolus timing optimizes opacification of the aorta. There is minimal atherosclerotic calcification of the aorta. No aneurysm or evidence for dissection. Pulmonary arteries are only moderately well opacified. No large or central pulmonary emboli identified. Mediastinum/Nodes: Thyroid gland is prominent. Small nodule in the LOWER pole of the RIGHT thyroid is 4 millimeters. No significant mediastinal, hilar, or axillary adenopathy. Lungs/Pleura: There is bronchial wall thickening. Airways are patent. There are scattered centrilobular emphysematous changes primarily within the UPPER lobes. No focal consolidations or pleural effusions. No pulmonary nodules. Upper Abdomen: No acute abnormality identified in the UPPER abdomen. Mild prominence of the LEFT adrenal  gland shows long-term stability (2011) Musculoskeletal: There are diffuse degenerative changes throughout the thoracic spine. No lytic or blastic lesions. Review of the MIP images confirms the above findings. IMPRESSION: 1. No evidence for aortic dissection or aneurysm. 2. Bronchial wall thickening and emphysematous changes. Aortic Atherosclerosis (ICD10-I70.0). 3. Coronary artery calcifications. 4. Enlarged thyroid gland. 5. 4 millimeter nodule within the RIGHT thyroid lobe. Not clinically significant; no follow-up imaging recommended (ref: J Am Coll Radiol. 2015 Feb;12(2): 143-50). Electronically Signed   By: ENolon NationsM.D.   On: 10/16/2020 10:25     Scheduled Meds:  amLODipine  5 mg Oral BH-q7a   cholecalciferol  2,000 Units Oral Daily   citalopram  10 mg Oral QHS   docusate sodium  100 mg Oral Daily   ferrous sulfate  325 mg Oral Q breakfast   umeclidinium bromide  1 puff Inhalation Daily   And   fluticasone furoate-vilanterol  1 puff Inhalation Daily   furosemide  40 mg Intravenous Daily   insulin aspart  0-20 Units Subcutaneous TID WC   loratadine  10 mg Oral Daily   losartan  25 mg Oral Daily   metoprolol tartrate  50 mg Oral BH-q7a  pantoprazole  40 mg Oral Daily   potassium chloride  40 mEq Oral Daily   rivaroxaban  20 mg Oral Daily   roflumilast  500 mcg Oral BH-q7a   rosuvastatin  10 mg Oral QPM   sodium chloride flush  3 mL Intravenous Q12H   Continuous Infusions:  sodium chloride       LOS: 2 days     Enzo Bi, MD [facility] If 7PM-7AM, please contact night-coverage 10/18/2020, 2:39 AM

## 2020-10-17 NOTE — Evaluation (Addendum)
Occupational Therapy Evaluation Patient Details Name: Patty Leon MRN: [id number] DOB: 01/02/1961 Today's Date: 10/17/2020    History of Present Illness Patty Leon is a 53yoF who [facility] c/o chest and back pain. PMH: COPD on 3L O2 baseline, nicotine dependence, DM, HTN, OSA c CPAP+O2., DVT on xarelto. Serial troponin x2 WNL. Pt admitted for COPD exacerbation.   Clinical Impression   Pt seen for OT evaluation this date. Upon arrival to room, pt standing at sink with b/l forearms on counter, and pt endorsing dizziness (pt on 4L of O2 via [place], and portable pulse ox reporting SpO2 86%). SpO2 remaining 85-86% following 1 min of PLB and pt subsequently encouraged to sit EOB. This [place] Curia was unable to obtain clear SpO2 reading from portable pulse ox while pt sat EOB. Following seated rest break (~3 mins) and pt reporting a decrease in dizziness, pt able to walk to recliner (~72f) with SUPERVISION only. While seated in recliner, pulse ox via dynamat reporting SpO2 92%. RN informed and pt encouraged to call RN during all OOB mobility. While sitting in recliner, pt was able to participate in seated grooming tasks with SET-UP assist and seated LB dressing with MIN GUARD. At baseline, pt is mod-independent for most ADLs (hx of aide assisting with sponge-bathing). Pt reports that an aide used to assist with IADLs.  Per chart review and pt report, pt's family reporting cognition different than usual, however no family present during eval to determine baseline cognition. Plan to confirm baseline cognition with family as able. This date, pt oriented to self, place, month/year, and parts of situation, however required increased processing time and verbal cues for safety awareness. Pt also endorsed difficulty remembering why she did not sleep well last night.  Pt would benefit from additional skilled OT services to maximize return to PLOF and minimize risk of future falls, injury, caregiver burden,  and readmission. Upon discharge, recommend HHOT follow up.     Follow Up Recommendations  Home health OT;Supervision/Assistance - 24 hour    Equipment Recommendations  None recommended by OT       Precautions / Restrictions Precautions Precautions: Fall Restrictions Weight Bearing Restrictions: No      Mobility Bed Mobility Overal bed mobility: Modified Independent             General bed mobility comments: not assessed; pt standing sink-side upon arrival to room and left in recliner at end of session    Transfers Overall transfer level: Needs assistance Equipment used: None Transfers: Sit to/from Stand Sit to Stand: Supervision              Balance Overall balance assessment: Needs assistance Sitting-balance support: No upper extremity supported;Feet supported Sitting balance-Leahy Scale: Good Sitting balance - Comments: Good balance sitting EOB reaching within BOS   Standing balance support: No upper extremity supported;During functional activity Standing balance-Leahy Scale: Fair Standing balance comment: SUPERVISION required d/t pt reporting dizziness when walking                           ADL either performed or assessed with clinical judgement   ADL Overall ADL's : Needs assistance/impaired     Grooming: Wash/dry hands;Set up;Sitting               Lower Body Dressing: Min guard;Sitting/lateral leans Lower Body Dressing Details (indicate cue type and reason): While sitting in recliner with LE elevated, pt able to don socks  Functional mobility during ADLs: Min guard (MIN GUARD d/t pt reporting dizziness when walking)       Vision Patient Visual Report: Nausea/blurring vision with head movement Additional Comments: Pt reporting dizziness when standing and with head turning side to side; pt unable to elaborate further. Plan to test vision further in functional context            Pertinent Vitals/Pain Pain  Assessment: Faces Pain Score: 9  Faces Pain Scale: Hurts a little bit Pain Location: neck Pain Descriptors / Indicators: Aching;Tightness Pain Intervention(s): Limited activity within patient's tolerance;Monitored during session;Repositioned     Hand Dominance Right   Extremity/Trunk Assessment Upper Extremity Assessment Upper Extremity Assessment: Overall WFL for tasks assessed   Lower Extremity Assessment Lower Extremity Assessment: Defer to PT evaluation       Communication Communication Communication: No difficulties   Cognition Arousal/Alertness:  (drowsy at times) Behavior During Therapy: WFL for tasks assessed/performed Overall Cognitive Status: No family/caregiver present to determine baseline cognitive functioning                                 General Comments: Pt oriented to self, place, month/year, and parts of situation. Per chart review and pt report, pt's family reporting cognition different than usual, however no family present during eval to determine baseline cognition. Pt requires increased processing time and endorses difficulty remembering why she did not sleep well last night.   General Comments  Upon arrival to room, pt out of bed, leaning over sink counter with b/l forearms on counter, and endorsing dizziness while standing (pt on 4L of O2 via [place], and portable pulse ox reporting SpO2 86%). SpO2 remaining 85-86% following 1 min of PLB while standing and pt encouraged to sit EOB. This [place] Curia was unable to obtain clear SpO2 reading from portable pulse ox while pt sat EOB. Following seated rest break (~3 mins) and pt reporting a decrease in dizziness, pt able to walk to recliner with SUPERVISION. Pulse ox via dynamat reporting SpO2 92% and pt in no acute distress. RN informed            Home Living Family/patient expects to be discharged to:: Private residence Living Arrangements: Alone Available Help at Discharge: Available  PRN/intermittently;Friend(s);Family (Children and sisters live nearby) Type of Home: Mobile home Home Access: Ramped entrance     Home Layout: One level               Home Equipment: None (pt reports no DME at home, but 74m pt reportedly had a RW and SPC)   Additional Comments: 1 fall in 6 months tangled in sheets in bed, portable concentrator      Prior Functioning/Environment Level of Independence: Independent with assistive device(s)  Gait / Transfers Assistance Needed: mostly household AMB, but grocery shops using buggy for support ADL's / Homemaking Assistance Needed: mod independent for most ADLs (aide assists with sponge-bathing). Pt reports aide assists with IADLs.   Comments: PRN use of SPC, drives (per EMR)        OT Problem List: Decreased activity tolerance;Impaired vision/perception;Decreased cognition;Impaired balance (sitting and/or standing)      OT Treatment/Interventions: Self-care/ADL training;Therapeutic exercise;DME and/or AE instruction;Therapeutic activities;Visual/perceptual remediation/compensation;Patient/family education;Balance training    OT Goals(Current goals can be found in the care plan section) Acute Rehab OT Goals Patient Stated Goal: to go home OT Goal Formulation: With patient Time For Goal Achievement: 10/31/20 Potential to Achieve  Goals: Good ADL Goals Pt Will Perform Grooming: with modified independence;standing Pt Will Transfer to Toilet: with modified independence;ambulating;regular height toilet Pt Will Perform Toileting - Clothing Manipulation and hygiene: with supervision;sit to/from stand  OT Frequency: Min 1X/week    AM-PAC OT "6 Clicks" Daily Activity     Outcome Measure Help from another person eating meals?: None Help from another person taking care of personal grooming?: A Little Help from another person toileting, which includes using toliet, bedpan, or urinal?: A Little Help from another person bathing (including  washing, rinsing, drying)?: A Little Help from another person to put on and taking off regular upper body clothing?: A Little Help from another person to put on and taking off regular lower body clothing?: A Little 6 Click Score: 19   End of Session Equipment Utilized During Treatment: Oxygen Nurse Communication: Mobility status  Activity Tolerance: Patient tolerated treatment well Patient left: in chair;with call bell/phone within reach;with chair alarm set  OT Visit Diagnosis: Unsteadiness on feet (R26.81);Other symptoms and signs involving cognitive function                Time: [id number] OT Time Calculation (min): 26 min Charges:  OT General Charges $OT Visit: 1 Visit OT Evaluation $OT Eval Moderate Complexity: 1 Mod OT Treatments $Self Care/Home Management : 8-22 mins  Fredirick Maudlin, OTR/L [place]

## 2020-10-17 NOTE — Progress Notes (Signed)
   10/17/20 0021  Assess: MEWS Score  Pulse Rate (!) 110  SpO2 94 %  O2 Device Nasal Cannula  O2 Flow Rate (L/min) 4 L/min  Assess: MEWS Score  MEWS Temp 0  MEWS Systolic 0  MEWS Pulse 1  MEWS RR 0  MEWS LOC 0  MEWS Score 1  MEWS Score Color Green  Assess: SIRS CRITERIA  SIRS Temperature  0  SIRS Pulse 1  SIRS Respirations  0  SIRS WBC 0  SIRS Score Sum  1  Pt oxygen had came dislocated from cpap. Pt sats dropped to 60% and unable to obtain adequate saturation with cpap. Pt requested to switch to [place]. Sats increased when switched to [place].

## 2020-10-17 NOTE — Progress Notes (Signed)
Called to patient's room by patient's sister Estill Bamberg. Sister states she is concerned about patient's mental status and continued headaches. Reviewed labs and radiology reports. Also informed her that per report patient had not slept all night which would contribute to confusion and had recently received oxycodone for her headache. Patient's sister requesting CT of head and urinalysis to be performed. MD notified.

## 2020-10-17 NOTE — Progress Notes (Signed)
Cpap checked as RN stated desaturation noted because oxygen off circuit. Patient was on cpap with o2.  Noted to be restless. Requested to sit up in the bed. Eventually moved to chair per RNS. Remains on nasal o2 at 4 liters. Patient decided not to go back on cpap

## 2020-10-17 NOTE — Progress Notes (Signed)
Patient now on bipap 12/5 with 3 liters o2 (per home setting). Patient tolerating well. HR 100 RR 20 o2 sat 100

## 2020-10-17 NOTE — Progress Notes (Addendum)
   10/16/20 2355  Assess: MEWS Score  Temp 98.6 F (37 C)  BP (!) 126/55  Pulse Rate (!) 112  Resp 20  SpO2 94 %  O2 Device CPAP  Assess: MEWS Score  MEWS Temp 0  MEWS Systolic 0  MEWS Pulse 2  MEWS RR 0  MEWS LOC 0  MEWS Score 2  MEWS Score Color Yellow  Assess: if the MEWS score is Yellow or Red  Were vital signs taken at a resting state? Yes  Focused Assessment No change from prior assessment  Does the patient meet 2 or more of the SIRS criteria? No  MEWS guidelines implemented *See Row Information* Yes  Treat  MEWS Interventions Escalated (See documentation below)  Pain Scale 0-10  Pain Score 0  Take Vital Signs  Increase Vital Sign Frequency  Yellow: Q 2hr X 2 then Q 4hr X 2, if remains yellow, continue Q 4hrs  Escalate  MEWS: Escalate Yellow: discuss with charge nurse/RN and consider discussing with provider and RRT  Notify: Charge Nurse/RN  Name of Charge Nurse/RN Notified Earlie Server, RN  Date Charge Nurse/RN Notified 10/17/20  Time Charge Nurse/RN Notified 2355  Document  Patient Outcome Other (Comment) (MEWS Q2hr initiated)  Progress note created (see row info) Yes  Assess: SIRS CRITERIA  SIRS Temperature  0  SIRS Pulse 1  SIRS Respirations  0  SIRS WBC 0  SIRS Score Sum  1  Patient alert and oriented, HR 112 MEWS score 2, MEWS guidelines implemented, will continue to monitor.

## 2020-10-17 NOTE — Evaluation (Signed)
Physical Therapy Evaluation Patient Details Name: Patty Leon MRN: [id number] DOB: 02-05-1961 Today's Date: 10/17/2020   History of Present Illness  Patty Leon is a 56yoF who comes to [facility] on 10/16[facility]20 c midsternal CP and radiation to back. PMH: COPD on 3L O2 baseline, nicotine dependence, DM, HTN, OSA c CPAP+O2., DVT on xarelto. Serial troponin x2 WNL. Pt admiutted in COPD exacerbation.  Clinical Impression  Pt admitted with above diagnosis. Pt currently with functional limitations due to the deficits listed below (see "PT Problem List"). Upon entry, pt in bed, awake and agreeable to participate. The pt is alert, pleasant, interactive, and able to provide some info regarding prior level of function, both in tolerance and independence, but has difficulty contrasting today's performance to baseline strength and balance. Pt can rise to standing without device without LOB, but when marching in place is commenced, has a posterior sway that requires minA from author to prevent fall onto bed- subsequently a bit more steady. Patient's performance this date reveals decreased ability, independence, and tolerance in performing all basic mobility required for performance of activities of daily living. Pt requires additional DME, close physical assistance, and cues for safe participate in mobility. Pt will benefit from skilled PT intervention to increase independence and safety with basic mobility in preparation for discharge to the venue listed below.       Follow Up Recommendations Home health PT (pt does not feel necessary, but has been unsteady this date with PT and RN)    Equipment Recommendations  Rolling walker with 5" wheels;3in1 (PT) (pt agreeable these would improve her safety and mobility)    Recommendations for Other Services       Precautions / Restrictions Precautions Precautions: Fall Restrictions Weight Bearing Restrictions: No      Mobility  Bed Mobility Overal bed mobility:  Modified Independent                  Transfers Overall transfer level: Needs assistance Equipment used: None Transfers: Sit to/from Stand Sit to Stand: Supervision            Ambulation/Gait Ambulation/Gait assistance:  (AMB to BR earlier with RN using RW per RN request)           General Gait Details: marching in place SpO2 100% on 3L/min, then 90% after return to recumbent in bed (moved back to 4L)  Stairs            Wheelchair Mobility    Modified Rankin (Stroke Patients Only)       Balance Overall balance assessment: Mild deficits observed, not formally tested (LOB with marching in place, LOB with RN earlier when going to BR)                                           Pertinent Vitals/Pain Pain Assessment: 0-10 Pain Score: 9  Pain Location: HA Pain Descriptors / Indicators: Aching Pain Intervention(s): Limited activity within patient's tolerance;Premedicated before session;Repositioned;Monitored during session    Home Living Family/patient expects to be discharged to:: Private residence Living Arrangements: Alone Available Help at Discharge: Available PRN/intermittently;Friend(s);Family ([facility] in [place], [place] (10-87mn away)) Type of Home: Mobile home Home Access: Ramped entrance     Home Layout: One level Home Equipment: None (pt reports no DME at home, but 232mpt reportedly had a RW and SPC) Additional Comments: 1 fall in 6 months  tangled in sheets in bed, portable concentrator    Prior Function Level of Independence: Independent with assistive device(s)   Gait / Transfers Assistance Needed: mostly household AMB, but grocery shops using buggy for support  ADL's / Homemaking Assistance Needed: mod independent, does not shower anymore, performs sponge baths  Comments: PRN use of SPC, drives (per EMR)     Hand Dominance   Dominant Hand: Right    Extremity/Trunk Assessment                Communication       Cognition Arousal/Alertness:  (awake, mildly drowsy after a poor sleep night) Behavior During Therapy: WFL for tasks assessed/performed Overall Cognitive Status: No family/caregiver present to determine baseline cognitive functioning                                 General Comments: seems slightly delayed and labored historian, did not sleep well last night.      General Comments      Exercises     Assessment/Plan    PT Assessment Patient needs continued PT services  PT Problem List Decreased strength;Decreased range of motion;Decreased activity tolerance;Decreased balance;Decreased mobility;Decreased coordination;Decreased knowledge of use of DME;Decreased safety awareness;Decreased knowledge of precautions;Cardiopulmonary status limiting activity;Decreased cognition       PT Treatment Interventions DME instruction;Balance training;Gait training;Stair training;Functional mobility training;Therapeutic activities;Therapeutic exercise;Patient/family education    PT Goals (Current goals can be found in the Care Plan section)  Acute Rehab PT Goals Patient Stated Goal: improve breathing, DC HA PT Goal Formulation: With patient Time For Goal Achievement: 10/31/20 Potential to Achieve Goals: Good    Frequency Min 2X/week   Barriers to discharge        Co-evaluation               AM-PAC PT "6 Clicks" Mobility  Outcome Measure Help needed turning from your back to your side while in a flat bed without using bedrails?: A Little Help needed moving from lying on your back to sitting on the side of a flat bed without using bedrails?: A Little Help needed moving to and from a bed to a chair (including a wheelchair)?: A Little Help needed standing up from a chair using your arms (e.g., wheelchair or bedside chair)?: A Little Help needed to walk in hospital room?: A Little Help needed climbing 3-5 steps with a railing? : A Lot 6 Click Score: 17    End of Session  Equipment Utilized During Treatment: Oxygen Activity Tolerance: Patient tolerated treatment well;No increased pain;Patient limited by fatigue Patient left: in bed;with call bell/phone within reach;with family/visitor present Nurse Communication: Mobility status PT Visit Diagnosis: Unsteadiness on feet (R26.81);Difficulty in walking, not elsewhere classified (R26.2);Other abnormalities of gait and mobility (R26.89)    Time: [id number] PT Time Calculation (min) (ACUTE ONLY): 23 min   Charges:   PT Evaluation $PT Eval Moderate Complexity: 1 Mod         10:07 AM, 10/17/20 Etta Grandchild, PT, DPT Physical Therapist - [facility]  [phone] ([place])    Clayton C 10/17/2020, 10:05 AM

## 2020-10-17 NOTE — TOC Initial Note (Addendum)
Transition of Care [facility]) - Initial/Assessment Note    Patient Details  Name: Patty Leon MRN: [id number] Date of Birth: 10-Feb-1961  Transition of Care [facility]) CM/SW Contact:    Magnus Ivan, LCSW Phone Number: 10/17/2020, 2:16 PM  Clinical Narrative:            CSW spoke to patient regarding discharge planning. Patient lives alone and drives herself to appointments. PCP is [name], NP. [facility] [place]. Patient had [facility] in the past, could not recall agency. Patient said she is supposed to have [place] services starting soon through [facility]. Patient is interested in HHPT/OT recs. No agency preference. CSW reached out to [facility] (unable to accept), [facility] (unable to accept), [facility], and [facility] (waiting on responses). Patient also agreeable to RW and 3 in 1 recommendation, CSW made referral to [facility] via secure email. Asked MD for orders. Will continue to try to establish home health coverage.  4:05- [facility] Lemmings has accepted patient for [facility].    Expected Discharge Plan: [place] Barriers to Discharge: Continued Medical Work up   Patient Goals and CMS Choice Patient states their goals for this hospitalization and ongoing recovery are:: home with home health CMS Medicare.gov Compare Post Acute Care list provided to:: Patient Choice offered to / list presented to : Patient  Expected Discharge Plan and Services Expected Discharge Plan: [place]       Living arrangements for the past 2 months: Single Family Home                 DME Arranged: Walker rolling, 3-N-1 DME Agency: [facility] Date DME Agency Contacted: 10/17/20   Representative spoke with at DME Agency: Jasmine/Lakresha/Zach HH Arranged: PT, OT       Representative spoke with at [place]: pending  Prior Living Arrangements/Services Living arrangements for the past 2 months: [place] with:: Self Patient language and need for interpreter  reviewed:: Yes Do you feel safe going back to the place where you live?: Yes      Need for Family Participation in Patient Care: Yes (Comment) Care giver support system in place?: Yes (comment)   Criminal Activity/Legal Involvement Pertinent to Current Situation/Hospitalization: No - Comment as needed  Activities of Daily Living Home Assistive Devices/Equipment: Cane (specify quad or straight) ADL Screening (condition at time of admission) Patient's cognitive ability adequate to safely complete daily activities?: Yes Is the patient deaf or have difficulty hearing?: No Does the patient have difficulty seeing, even when wearing glasses/contacts?: Yes Does the patient have difficulty concentrating, remembering, or making decisions?: No Patient able to express need for assistance with ADLs?: Yes Does the patient have difficulty dressing or bathing?: Yes (Acute - not at baseline) Independently performs ADLs?: Yes (appropriate for developmental age) Does the patient have difficulty walking or climbing stairs?: Yes Weakness of Legs: Both Weakness of Arms/Hands: Both  Permission Sought/Granted Permission sought to share information with : Chartered certified accountant granted to share information with : Yes, Verbal Permission Granted     Permission granted to share info w AGENCY: home health and DME agencies        Emotional Assessment       Orientation: : Oriented to Self, Oriented to Place, Oriented to  Time, Oriented to Situation Alcohol / Substance Use: Not Applicable Psych Involvement: No (comment)  Admission diagnosis:  Acute respiratory failure (HCC) [J96.00] Dyspnea on exertion [R06.00] Hypoxia [R09.02] 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] COPD with acute exacerbation (HCC) [J44.1] Chronic obstructive pulmonary disease with acute exacerbation (HCC) [J44.1] Other back pain, unspecified chronicity [id number] Systolic congestive heart failure, unspecified HF  chronicity ([place]) [I50.20] Patient Active Problem List   Diagnosis Date Noted   Acute respiratory failure ([place]) 10/16/2020   Nicotine dependence 10/16/2020   Acute CHF (congestive heart failure) ([place]) 10/16/2020   Chest pain 10/16/2020   Chronic respiratory failure with hypoxia ([place]) 07/24/2020   Chronic anticoagulation 07/24/2020   History of DVT (deep vein thrombosis) 07/24/2020   COPD exacerbation ([place]) 07/24/2020   Obesity, Class III, BMI 40-49.9 (morbid obesity) ([place]) 07/24/2020   SIRS (systemic inflammatory response syndrome) ([place]) 06/03/2020   Diabetes ([place]) 06/03/2020   GERD (gastroesophageal reflux disease) 06/03/2020   Anxiety disorder 06/03/2020   Acute on chronic respiratory failure with hypoxia ([place]) 06/03/2020   COPD with acute exacerbation ([place]) 04/23/2017   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   Primary localized osteoarthritis of left knee 10/07/2015   Obstructive sleep apnea 08/05/2013   Benign essential hypertension 01/16/2011   PCP:  Danelle Berry, NP [id number] [place] [address] [id number]: [phone]: [phone]    Social Determinants of Health (SDOH) Interventions    Readmission Risk Interventions Readmission Risk Prevention Plan 10/17/2020  Transportation Screening Complete  PCP or Specialist Appt within 5-7 Days Complete  Home Care Screening Complete  Medication Review (RN CM) Complete  Some recent data might be hidden

## 2020-10-18 ENCOUNTER — Inpatient Hospital Stay: Payer: Medicaid Other

## 2020-10-18 ENCOUNTER — Inpatient Hospital Stay
Admit: 2020-10-18 | Discharge: 2020-10-18 | Disposition: A | Discharge status: Home / Self Care | Payer: Medicaid Other | Attending: Internal Medicine | Admitting: Internal Medicine

## 2020-10-18 LAB — BASIC METABOLIC PANEL
Anion gap: 7 (ref 5–15)
BUN: 11 mg/dL (ref 6–20)
CO2: 45 mmol/L — ABNORMAL HIGH (ref 22–32)
Calcium: 9.3 mg/dL (ref 8.9–10.3)
Chloride: 87 mmol/L — ABNORMAL LOW (ref 98–111)
Creatinine, Ser: 0.4 mg/dL — ABNORMAL LOW (ref 0.44–1.00)
GFR, Estimated: >60 mL/min (ref >60)
Glucose, Bld: 174 mg/dL — ABNORMAL HIGH (ref 70–99)
Potassium: 3.7 mmol/L (ref 3.5–5.1)
Sodium: 139 mmol/L (ref 135–145)

## 2020-10-18 LAB — CBC
HCT: 47.9 % — ABNORMAL HIGH (ref 36.0–46.0)
Hemoglobin: 14.4 g/dL (ref 12.0–15.0)
MCH: 29.8 pg (ref 26.0–34.0)
MCHC: 30.1 g/dL (ref 30.0–36.0)
MCV: 99.2 fL (ref 80.0–100.0)
Platelets: 246 10*3/uL (ref 150–400)
RBC: 4.83 MIL/uL (ref 3.87–5.11)
RDW: 13.4 % (ref 11.5–15.5)
WBC: 8.7 10*3/uL (ref 4.0–10.5)
nRBC: 0 % (ref 0.0–0.2)

## 2020-10-18 LAB — ECHOCARDIOGRAM COMPLETE
AR max vel: 2.37 cm2
AV Area VTI: 2.81 cm2
AV Area mean vel: 2.39 cm2
AV Mean grad: 7 mmHg
AV Peak grad: 11.7 mmHg
Ao pk vel: 1.71 m/s
Area-P 1/2: 2.66 cm2
Height: 62 in
S' Lateral: 2 cm
Weight: 4740.77 oz

## 2020-10-18 LAB — HEMOGLOBIN A1C
Hgb A1c MFr Bld: 7.3 % — ABNORMAL HIGH (ref 4.8–5.6)
Mean Plasma Glucose: 163 mg/dL

## 2020-10-18 LAB — GLUCOSE, CAPILLARY: Glucose-Capillary: 170 mg/dL — ABNORMAL HIGH (ref 70–99)

## 2020-10-18 LAB — MAGNESIUM: Magnesium: 2.1 mg/dL (ref 1.7–2.4)

## 2020-10-18 MED ORDER — ONDANSETRON HCL 4 MG/2ML IJ SOLN
4.0000 mg | Freq: Four times a day (QID) | INTRAMUSCULAR | Status: DC | PRN
  Administered 2020-10-18: 04:00:00 4 mg via INTRAVENOUS
  Filled 2020-10-18: qty 2

## 2020-10-18 MED ORDER — LORAZEPAM 0.5 MG PO TABS
0.5000 mg | ORAL_TABLET | Freq: Once | ORAL | Status: AC
  Administered 2020-10-18: 0.5 mg via ORAL
  Filled 2020-10-18: qty 1

## 2020-10-18 MED ORDER — FUROSEMIDE 40 MG PO TABS: 40.0000 mg | ORAL_TABLET | Freq: Every day | ORAL | 0 refills | Status: DC

## 2020-10-18 MED ORDER — HYDROCODONE-ACETAMINOPHEN 5-325 MG PO TABS
1.0000 | ORAL_TABLET | ORAL | Status: AC | PRN
  Administered 2020-10-18: 03:00:00 1 via ORAL
  Filled 2020-10-18: qty 1

## 2020-10-18 MED ORDER — ACETAMINOPHEN 500 MG PO TABS
1000.0000 mg | ORAL_TABLET | Freq: Once | ORAL | Status: AC
  Administered 2020-10-18: 09:00:00 1000 mg via ORAL
  Filled 2020-10-18: qty 2

## 2020-10-18 MED ORDER — IBUPROFEN 400 MG PO TABS
400.0000 mg | ORAL_TABLET | Freq: Once | ORAL | Status: AC
  Administered 2020-10-18: 400 mg via ORAL
  Filled 2020-10-18: qty 1

## 2020-10-18 NOTE — Progress Notes (Signed)
Went through discharge paper with patient, verbalize understanding. Pt chose to go home without oxygen, she left POV with her friend

## 2020-10-18 NOTE — Progress Notes (Signed)
*  PRELIMINARY RESULTS* Echocardiogram 2D Echocardiogram has been performed.  Sherrie Sport 10/18/2020, 7:41 AM

## 2020-10-18 NOTE — Discharge Summary (Signed)
Physician Discharge Summary   Patty Leon  female DOB: June 15, 1960  [id number]  PCP: Patty Berry, NP  Admit date: 10/16/2020 Discharge date: 10/18/2020  Admitted From: home Disposition:  home Home Health: Yes CODE STATUS: Full code  Discharge Instructions     Discharge instructions   Complete by: As directed    For your chest pain going towards the back, CT angiogram of your chest was without acute findings.  For your headache, CT head was completely normal.  You can take tylenol 1000 mg together with Advil 400 mg for headache.  For your shortness of breath, chest xray showed some congestion, so you have received IV lasix while in the hospital.  I have prescribed 7 more days of oral lasix for you to take at home as directed.  Your oxygen level is good on your home 3 liters of supplemental oxygen.  You have not been treated for COPD flare up.   Dr. Enzo Leon [facility] Course:  For full details, please see H&P, progress notes, consult notes and ancillary notes.  Briefly,  Patty Leon is a 60 y.o. female with medical history significant for morbid obesity, COPD on 3L home O2, nicotine dependence, diabetes mellitus, hypertension and dyslipidemia who presented to the ER for evaluation of a 3-day history of intermittent midsternal chest pain with radiation to the back.    Pt status was changed from observation to inpatient on 8/7 per instruction of physician advisor.   COPD, exacerbation ruled out chronic respiratory failure on 3L home O2 --pt was not started on steroid.  On exam, no wheezing. --cont daily bronchodilators  --cont home 3L O2   Pulm congestion CHF ruled out --pt reported increased BLE swelling and dyspnea.  CXR showed Borderline cardiomegaly with pulmonary venous congestion.   --No Echo on record.  Echo during hospitalization showed normal systolic and diastolic function.   --started on IV lasix with reported good urine  output. --pt was discharged on oral lasix 40 mg for 7 days.   Morbid obesity (BMI > 40)    obstructive sleep apnea on home BiPAP --order BiPAP nightly   Nicotine dependence Smoking cessation has been discussed with patient in detail she declines a nicotine transdermal patch at this time   History of DVT Continue Xarelto   Diabetes mellitus --A1c 7.3. --SSI while inpatient.  Resume home regimen after discharge (see below).   Chest pain, ACS ruled out Serial cardiac enzymes are within normal limits CT angiogram No evidence for aortic dissection or aneurysm. --CP resolved    Discharge Diagnoses:  Principal Problem:   COPD with acute exacerbation (Patty Leon) Active Problems:   Diabetes (Patty Leon)   GERD (gastroesophageal reflux disease)   Obstructive sleep apnea   History of DVT (deep vein thrombosis)   Obesity, Class III, BMI 40-49.9 (morbid obesity) (HCC)   Nicotine dependence   Chest pain    30 Day Unplanned Readmission Risk Score    Flowsheet Row ED to Hosp-Admission (Current) from 10/16/2020 in [place] (1C)  30 Day Unplanned Readmission Risk Score (%) 18.08 Filed at 10/18/2020 0801       This score is the patient's risk of an unplanned readmission within 30 days of being discharged (0 -100%). The score is based on dignosis, age, lab data, medications, orders, and past utilization.   Low:  0-14.9   Medium: 15-21.9   High: 22-29.9   Extreme: 30 and  above         Discharge Instructions:  Allergies as of 10/18/2020       Reactions   Celebrex [celecoxib] Swelling   Dilaudid [hydromorphone] Itching        Medication List     STOP taking these medications    cyclobenzaprine 10 MG tablet Commonly known as: FLEXERIL   doxycycline 100 MG capsule Commonly known as: VIBRAMYCIN   guaiFENesin 600 MG 12 hr tablet Commonly known as: MUCINEX   levofloxacin 750 MG tablet Commonly known as: LEVAQUIN   polyethylene glycol powder 17  GM/SCOOP powder Commonly known as: GLYCOLAX/MIRALAX       TAKE these medications    albuterol 108 (90 Base) MCG/ACT inhaler Commonly known as: VENTOLIN HFA Inhale 2 puffs into the lungs every 6 (six) hours as needed for wheezing.   amLODipine 5 MG tablet Commonly known as: NORVASC Take 5 mg by mouth every morning.   cetirizine 10 MG tablet Commonly known as: ZYRTEC 10 mg daily.   citalopram 10 MG tablet Commonly known as: CELEXA Take 10 mg by mouth at bedtime.   docusate sodium 100 MG capsule Commonly known as: COLACE Take 100 mg by mouth daily.   ferrous sulfate 325 (65 FE) MG tablet Take by mouth.   furosemide 40 MG tablet Commonly known as: Lasix Take 1 tablet (40 mg total) by mouth daily for 7 days.   hydrochlorothiazide 25 MG tablet Commonly known as: HYDRODIURIL Take 0.5 tablets (12.5 mg total) by mouth daily.   Jardiance 25 MG Tabs tablet Generic drug: empagliflozin Take 25 mg by mouth every morning.   losartan 25 MG tablet Commonly known as: COZAAR Take 25 mg by mouth daily.   metFORMIN 500 MG tablet Commonly known as: GLUCOPHAGE Take 500 mg by mouth 2 (two) times daily.   metoprolol tartrate 50 MG tablet Commonly known as: LOPRESSOR Take 50 mg by mouth every morning.   nicotine 21 mg/24hr patch Commonly known as: NICODERM CQ - dosed in mg/24 hours 21 mg daily.   omeprazole 40 MG capsule Commonly known as: PRILOSEC Take 40 mg by mouth every morning.   oxyCODONE 5 MG immediate release tablet Commonly known as: Oxy IR/ROXICODONE Take 5 mg by mouth 3 (three) times daily as needed.   OXYGEN Inhale into the lungs.   potassium chloride SA 20 MEQ tablet Commonly known as: KLOR-CON Take 2 tablets (40 mEq total) by mouth daily.   roflumilast 500 MCG Tabs tablet Commonly known as: DALIRESP Take 500 mcg by mouth every morning.   rosuvastatin 10 MG tablet Commonly known as: CRESTOR Take 10 mg by mouth every evening.   Trelegy Ellipta  100-62.5-25 MCG/INH Aepb Generic drug: Fluticasone-Umeclidin-Vilant Inhale into the lungs daily.   Vitamin D 50 MCG (2000 UT) Caps Take 2,000 Units by mouth daily.   Xarelto 20 MG Tabs tablet Generic drug: rivaroxaban Take 20 mg by mouth daily.               Durable Medical Equipment  (From admission, onward)           Start     Ordered   10/17/20 1422  For home use only DME Walker rolling  Once       Question Answer Comment  Walker: With 5 Inch Wheels   Patient needs a walker to treat with the following condition Weakness      10/17/20 1421   10/17/20 1422  For home use only DME 3 n 1  Once        10/17/20 1421             Follow-up Information     Boswell, Ardis Rowan, NP Follow up in 1 week(s).   Specialty: Nurse Practitioner Contact information: [place] [address] [phone]                 Allergies  Allergen Reactions   Celebrex [Celecoxib] Swelling   Dilaudid [Hydromorphone] Itching     The results of significant diagnostics from this hospitalization (including imaging, microbiology, ancillary and laboratory) are listed below for reference.   Consultations:   Procedures/Studies: DG Chest 2 View  Result Date: 10/16/2020 CLINICAL DATA:  60 year old female with history of chest pain. EXAM: CHEST - 2 VIEW COMPARISON:  Chest x-ray 07/24/2020. FINDINGS: Lung volumes are normal. No consolidative airspace disease. No pleural effusions. No pneumothorax. No pulmonary nodule or mass noted. Cephalization of the pulmonary vasculature, without frank pulmonary edema. Heart size is borderline enlarged. Upper mediastinal contours are within normal limits. IMPRESSION: 1. Borderline cardiomegaly with pulmonary venous congestion, but no frank pulmonary edema. Electronically Signed   By: Vinnie Langton M.D.   On: 10/16/2020 07:14   CT HEAD WO CONTRAST (5MM)  Result Date: 10/18/2020 CLINICAL DATA:  Acute worsening headache EXAM: CT  HEAD WITHOUT CONTRAST TECHNIQUE: Contiguous axial images were obtained from the base of the skull through the vertex without intravenous contrast. COMPARISON:  05/25/2011, 07/31/2005 FINDINGS: Brain: No evidence of acute infarction, hemorrhage, hydrocephalus, extra-axial collection or mass lesion/mass effect. Vascular: Intracranial atherosclerosis at the skull base. No hyperdense vessel. Skull: Normal. Negative for fracture or focal lesion. Sinuses/Orbits: No acute finding. Other: None. IMPRESSION: Normal head CT without contrast Electronically Signed   By: Jerilynn Mages.  Shick M.D.   On: 10/18/2020 07:43   CT ANGIO CHEST AORTA W/CM &/OR WO/CM  Result Date: 10/16/2020 CLINICAL DATA:  Mid chest pain radiates to back. EXAM: CT ANGIOGRAPHY CHEST WITH CONTRAST TECHNIQUE: Multidetector CT imaging of the chest was performed using the standard protocol during bolus administration of intravenous contrast. Multiplanar CT image reconstructions and MIPs were obtained to evaluate the vascular anatomy. CONTRAST:  113m OMNIPAQUE IOHEXOL 350 MG/ML SOLN COMPARISON:  CT 02/21/2010 and chest x-ray today FINDINGS: Cardiovascular: Heart size is normal. There are coronary artery calcifications. No pericardial effusion. Bolus timing optimizes opacification of the aorta. There is minimal atherosclerotic calcification of the aorta. No aneurysm or evidence for dissection. Pulmonary arteries are only moderately well opacified. No large or central pulmonary emboli identified. Mediastinum/Nodes: Thyroid gland is prominent. Small nodule in the LOWER pole of the RIGHT thyroid is 4 millimeters. No significant mediastinal, hilar, or axillary adenopathy. Lungs/Pleura: There is bronchial wall thickening. Airways are patent. There are scattered centrilobular emphysematous changes primarily within the UPPER lobes. No focal consolidations or pleural effusions. No pulmonary nodules. Upper Abdomen: No acute abnormality identified in the UPPER abdomen. Mild  prominence of the LEFT adrenal gland shows long-term stability (2011) Musculoskeletal: There are diffuse degenerative changes throughout the thoracic spine. No lytic or blastic lesions. Review of the MIP images confirms the above findings. IMPRESSION: 1. No evidence for aortic dissection or aneurysm. 2. Bronchial wall thickening and emphysematous changes. Aortic Atherosclerosis (ICD10-I70.0). 3. Coronary artery calcifications. 4. Enlarged thyroid gland. 5. 4 millimeter nodule within the RIGHT thyroid lobe. Not clinically significant; no follow-up imaging recommended (ref: J Am Coll Radiol. 2015 Feb;12(2): 143-50). Electronically Signed   By: ENolon NationsM.D.   On: 10/16/2020 10:25  Labs: BNP (last 3 results) Recent Labs    10/16/20 0608  BNP 123456*   Basic Metabolic Panel: Recent Labs  Lab 10/16/20 0608 10/18/20 0501  NA 141 139  K 3.0* 3.7  CL 86* 87*  CO2 40* 45*  GLUCOSE 177* 174*  BUN 12 11  CREATININE 0.63 0.40*  CALCIUM 9.2 9.3  MG  --  2.1   Liver Function Tests: No results for input(s): AST, ALT, ALKPHOS, BILITOT, PROT, ALBUMIN in the last 168 hours. No results for input(s): LIPASE, AMYLASE in the last 168 hours. No results for input(s): AMMONIA in the last 168 hours. CBC: Recent Labs  Lab 10/16/20 0608 10/18/20 0501  WBC 10.9* 8.7  HGB 15.3* 14.4  HCT 49.4* 47.9*  MCV 99.8 99.2  PLT 277 246   Cardiac Enzymes: No results for input(s): CKTOTAL, CKMB, CKMBINDEX, TROPONINI in the last 168 hours. BNP: Invalid input(s): POCBNP CBG: Recent Labs  Lab 10/17/20 0716 10/17/20 1137 10/17/20 1548 10/17/20 2114 10/18/20 0750  GLUCAP 173* 175* 212* 158* 170*   D-Dimer No results for input(s): DDIMER in the last 72 hours. Hgb A1c Recent Labs    10/16/20 1405  HGBA1C 7.3*   Lipid Profile No results for input(s): CHOL, HDL, LDLCALC, TRIG, CHOLHDL, LDLDIRECT in the last 72 hours. Thyroid function studies No results for input(s): TSH, T4TOTAL, T3FREE,  THYROIDAB in the last 72 hours.  Invalid input(s): FREET3 Anemia work up No results for input(s): VITAMINB12, FOLATE, FERRITIN, TIBC, IRON, RETICCTPCT in the last 72 hours. Urinalysis    Component Value Date/Time   COLORURINE YELLOW (A) 04/25/2017 0651   APPEARANCEUR CLEAR (A) 04/25/2017 0651   APPEARANCEUR Clear 09/03/2012 0739   LABSPEC 1.011 04/25/2017 0651   LABSPEC 1.005 09/03/2012 0739   PHURINE 6.0 04/25/2017 0651   GLUCOSEU NEGATIVE 04/25/2017 0651   GLUCOSEU Negative 09/03/2012 0739   HGBUR MODERATE (A) 04/25/2017 0651   BILIRUBINUR NEGATIVE 04/25/2017 0651   BILIRUBINUR Negative 09/03/2012 0739   KETONESUR NEGATIVE 04/25/2017 0651   PROTEINUR NEGATIVE 04/25/2017 0651   NITRITE NEGATIVE 04/25/2017 0651   LEUKOCYTESUR NEGATIVE 04/25/2017 0651   LEUKOCYTESUR Negative 09/03/2012 0739   Sepsis Labs Invalid input(s): PROCALCITONIN,  WBC,  LACTICIDVEN Microbiology Recent Results (from the past 240 hour(s))  Resp Panel by RT-PCR (Flu A&B, Covid) Nasopharyngeal Swab     Status: None   Collection Time: 10/16/20 10:18 AM   Specimen: Nasopharyngeal Swab; Nasopharyngeal(NP) swabs in vial transport medium  Result Value Ref Range Status   SARS Coronavirus 2 by RT PCR NEGATIVE NEGATIVE Final    Comment: (NOTE) SARS-CoV-2 target nucleic acids are NOT DETECTED.  The SARS-CoV-2 RNA is generally detectable in upper respiratory specimens during the acute phase of infection. The lowest concentration of SARS-CoV-2 viral copies this assay can detect is 138 copies/mL. A negative result does not preclude SARS-Cov-2 infection and should not be used as the sole basis for treatment or other patient management decisions. A negative result may occur with  improper specimen collection/handling, submission of specimen other than nasopharyngeal swab, presence of viral mutation(s) within the areas targeted by this assay, and inadequate number of viral copies(<138 copies/mL). A negative result  must be combined with clinical observations, patient history, and epidemiological information. The expected result is Negative.  Fact Sheet for Patients:  EntrepreneurPulse.com.au  Fact Sheet for Healthcare Providers:  IncredibleEmployment.be  This test is no t yet approved or cleared by the [place] FDA and  has been authorized for detection and/or diagnosis of  SARS-CoV-2 by FDA under an Emergency Use Authorization (EUA). This EUA will remain  in effect (meaning this test can be used) for the duration of the COVID-19 declaration under Section 564(b)(1) of the Act, 21 U.S.C.section 360bbb-3(b)(1), unless the authorization is terminated  or revoked sooner.       Influenza A by PCR NEGATIVE NEGATIVE Final   Influenza B by PCR NEGATIVE NEGATIVE Final    Comment: (NOTE) The Xpert Xpress SARS-CoV-2/FLU/RSV plus assay is intended as an aid in the diagnosis of influenza from Nasopharyngeal swab specimens and should not be used as a sole basis for treatment. Nasal washings and aspirates are unacceptable for Xpert Xpress SARS-CoV-2/FLU/RSV testing.  Fact Sheet for Patients: EntrepreneurPulse.com.au  Fact Sheet for Healthcare Providers: IncredibleEmployment.be  This test is not yet approved or cleared by the [place] FDA and has been authorized for detection and/or diagnosis of SARS-CoV-2 by FDA under an Emergency Use Authorization (EUA). This EUA will remain in effect (meaning this test can be used) for the duration of the COVID-19 declaration under Section 564(b)(1) of the Act, 21 U.S.C. section 360bbb-3(b)(1), unless the authorization is terminated or revoked.  Performed at [facility], [address] [place], [place] [address]      Total time spend on discharging this patient, including the last patient exam, discussing the hospital stay, instructions for ongoing care as it relates  to all pertinent caregivers, as well as preparing the medical discharge records, prescriptions, and/or referrals as applicable, is 40 minutes.    Patty Bi, MD  [facility] 10/18/2020, 10:17 AM

## 2020-10-18 NOTE — Progress Notes (Signed)
Nutrition Brief Note  New consult received for assessment of nutrition requirements. Reviewed hx and pt to be dc today. DM reports family to transport home.  Per chart review of this admission, no weight loss is identified and pt has had good intake of meals.  Wt Readings from Last 15 Encounters:  10/16/20 134.4 kg  07/25/20 129.9 kg  06/04/20 126.1 kg  12/13/18 129.3 kg  04/26/18 126.6 kg  10/15/17 129.3 kg  04/23/17 124.7 kg  10/11/16 127 kg  07/12/16 120.2 kg  07/12/16 120.2 kg  04/04/16 120.2 kg  03/27/16 120.2 kg  02/08/16 123.3 kg  01/25/16 117.9 kg  11/04/15 120.2 kg    Body mass index is 54.19 kg/m. Patient meets criteria for obesity class III based on current BMI.   Current diet order is carb modified, patient is consuming approximately 97% of meals at this time. Labs and medications reviewed.   No nutrition interventions warranted at this time. If nutrition issues arise, please consult RD. If diet or lifestyle counseling is desired, please consult outpatient dietitian for nutrition education.  Ranell Patrick, RD, LDN Clinical Dietitian Pager on [place]

## 2020-10-18 NOTE — Progress Notes (Signed)
Physical Therapy Treatment Patient Details Name: Patty Leon MRN: [id number] DOB: Dec 19, 1960 Today's Date: 10/18/2020    History of Present Illness Patty Leon is a 89yoF who comes to [facility] on 10/16/20 c/o chest and back pain. PMH: COPD on 3L O2 baseline, nicotine dependence, DM, HTN, OSA c CPAP+O2., DVT on xarelto. Serial troponin x2 WNL. Pt admitted for COPD exacerbation.    PT Comments    Pt received out of bed, toileting in bathroom. Agreeable to therapy. PT stated goal of performing ambulation out of room today and educated pt on out-and-back nature of ambulation and to communicate when pt needs to turn around. Pt lacked in communication although PT continued to check in on pt; she was significantly fatigued once she returned to her room. She stated her "arms needed to sit down" due to fatigue from UE support on RW. She did increase ambulation distance to 273f demo mild gait deviations requiring SUP for safety. SpO2 was monitored throughout session while on 3L O2: 100% at rest, 86% after 2070fambulation, 90% after 2 minutes of seated rest. Pt continues to demo decreased functional endurance, mild gait deviations and lack of safety awareness. Would benefit from skilled PT to address above deficits and promote optimal return to PLOF.     Follow Up Recommendations  Home health PT (pt does not feel necessary, but has been unsteady this date with PT and RN)     Equipment Recommendations  Rolling walker with 5" wheels;3in1 (PT) (pt agreeable these would improve her safety and mobility)    Recommendations for Other Services       Precautions / Restrictions Precautions Precautions: Fall Restrictions Weight Bearing Restrictions: No    Mobility  Bed Mobility               General bed mobility comments: Not assessed: pt toileting upon arrival and ended session in recliner    Transfers Overall transfer level: Needs assistance Equipment used: Rolling walker (2  wheeled) Transfers: Sit to/from Stand Sit to Stand: Supervision         General transfer comment: SUP for safety using RW, increased rocking momentum used to stand  Ambulation/Gait Ambulation/Gait assistance: Supervision Gait Distance (Feet): 200 Feet Assistive device: Rolling walker (2 wheeled) Gait Pattern/deviations: Step-through pattern;Decreased step length - right;Decreased step length - left;Decreased stride length;Trunk flexed;Drifts right/left Gait velocity: fluctuating between decreased and WFL   General Gait Details: 20053fsing RW on 3L O2. Pt drifted left and right during ambulation with PT VC to ambulate in a straight line. Increased dependence on BUE. Inconsistent cadence and gait speed throughout. SpO2: 100% prior to ambulation, 86% after ambulation, elevated to 90% after 2 minutes of sitting; all on 3L O2.   Stairs             Wheelchair Mobility    Modified Rankin (Stroke Patients Only)       Balance                                            Cognition Arousal/Alertness: Awake/alert (drowsy at times) Behavior During Therapy: WFL for tasks assessed/performed Overall Cognitive Status: No family/caregiver present to determine baseline cognitive functioning                                 General Comments: Pt  followed 2-step commands well however she demo limited safety awareness during mobility amd limited communication regarding exercise tolerance      Exercises      General Comments        Pertinent Vitals/Pain Pain Assessment: No/denies pain    Home Living                      Prior Function            PT Goals (current goals can now be found in the care plan section) Acute Rehab PT Goals Patient Stated Goal: to go home PT Goal Formulation: With patient Time For Goal Achievement: 10/31/20 Potential to Achieve Goals: Good    Frequency    Min 2X/week      PT Plan       Co-evaluation              AM-PAC PT "6 Clicks" Mobility   Outcome Measure  Help needed turning from your back to your side while in a flat bed without using bedrails?: A Little Help needed moving from lying on your back to sitting on the side of a flat bed without using bedrails?: A Little Help needed moving to and from a bed to a chair (including a wheelchair)?: A Little Help needed standing up from a chair using your arms (e.g., wheelchair or bedside chair)?: A Little Help needed to walk in hospital room?: A Little Help needed climbing 3-5 steps with a railing? : A Lot 6 Click Score: 17    End of Session Equipment Utilized During Treatment: Oxygen Activity Tolerance: Patient tolerated treatment well;Patient limited by fatigue Patient left: with call bell/phone within reach;in chair;with nursing/sitter in room (CM and RN) Nurse Communication: Mobility status;Other (comment) (pt stating she will be ready for d/c in 30 minutes) PT Visit Diagnosis: Unsteadiness on feet (R26.81);Difficulty in walking, not elsewhere classified (R26.2);Other abnormalities of gait and mobility (R26.89)     Time: 1020-1043 PT Time Calculation (min) (ACUTE ONLY): 23 min  Charges:  $Gait Training: 8-22 mins $Therapeutic Activity: 8-22 mins                     [name] PT, DPT 10/18/20 12:23 PM [id number]    [facility] 10/18/2020, 12:22 PM

## 2020-10-18 NOTE — TOC Transition Note (Signed)
Transition of Care [facility]) - CM/SW Discharge Note   Patient Details  Name: Patty Leon MRN: [id number] Date of Birth: [birth year]  [facility] CM/SW Contact:  Shelbie Hutching, RN Phone Number: 10/18/2020, 10:57 AM   Clinical Narrative:    Patient is medically cleared for discharge home today with home health services.  Patient's family is coming to pick her up.  Alvis Lemmings will provide home health services.  RW and 3 in 1 will be delivered to the room before patient leaves.     Final next level of care: [place] Barriers to Discharge: Barriers Resolved   Patient Goals and CMS Choice Patient states their goals for this hospitalization and ongoing recovery are:: Patient very happy to go home today CMS Medicare.gov Compare Post Acute Care list provided to:: Patient Choice offered to / list presented to : Patient  Discharge Placement                       Discharge Plan and Services                DME Arranged: Walker rolling, 3-N-1 DME Agency: [employer] Date DME Agency Contacted: 10/18/20 Time DME Agency Contacted: [id number] Representative spoke with at DME Agency: Suanne Marker HH Arranged: PT, OT [place] Agency: [place] Date [place]: 10/18/20 Time [place]: 1057 Representative spoke with at [place]: [place] ([place]) Interventions     Readmission Risk Interventions Readmission Risk Prevention Plan 10/17/2020  Transportation Screening Complete  PCP or Specialist Appt within 5-7 Days Complete  Home Care Screening Complete  Medication Review (RN CM) Complete  Some recent data might be hidden

## 2020-10-18 NOTE — Progress Notes (Signed)
Patient alert and oriented, complained of severe headaches this shift, PRN tylenol and Fioricet given with no improvement per pt. Hospitalist notified, Norco and CT scan ordered per hospitalist, will continue to monitor.

## 2020-11-17 ENCOUNTER — Other Ambulatory Visit: Payer: Self-pay

## 2020-11-17 ENCOUNTER — Encounter: Payer: Self-pay | Admitting: Emergency Medicine

## 2020-11-17 ENCOUNTER — Emergency Department: Payer: Medicaid Other

## 2020-11-17 ENCOUNTER — Emergency Department
Admission: EM | Admit: 2020-11-17 | Discharge: 2020-11-17 | Disposition: A | Discharge status: Home / Self Care | Payer: Medicaid Other | Attending: Emergency Medicine | Admitting: Emergency Medicine

## 2020-11-17 DIAGNOSIS — Z79899 Other long term (current) drug therapy: Secondary | ICD-10-CM | POA: Diagnosis not present

## 2020-11-17 DIAGNOSIS — Z96652 Presence of left artificial knee joint: Secondary | ICD-10-CM | POA: Diagnosis not present

## 2020-11-17 DIAGNOSIS — E119 Type 2 diabetes mellitus without complications: Secondary | ICD-10-CM | POA: Diagnosis not present

## 2020-11-17 DIAGNOSIS — I509 Heart failure, unspecified: Secondary | ICD-10-CM | POA: Insufficient documentation

## 2020-11-17 DIAGNOSIS — J441 Chronic obstructive pulmonary disease with (acute) exacerbation: Secondary | ICD-10-CM | POA: Insufficient documentation

## 2020-11-17 DIAGNOSIS — Z96611 Presence of right artificial shoulder joint: Secondary | ICD-10-CM | POA: Insufficient documentation

## 2020-11-17 DIAGNOSIS — F17210 Nicotine dependence, cigarettes, uncomplicated: Secondary | ICD-10-CM | POA: Insufficient documentation

## 2020-11-17 DIAGNOSIS — Z7984 Long term (current) use of oral hypoglycemic drugs: Secondary | ICD-10-CM | POA: Insufficient documentation

## 2020-11-17 DIAGNOSIS — Z20822 Contact with and (suspected) exposure to covid-19: Secondary | ICD-10-CM | POA: Diagnosis not present

## 2020-11-17 DIAGNOSIS — I110 Hypertensive heart disease with heart failure: Secondary | ICD-10-CM | POA: Insufficient documentation

## 2020-11-17 DIAGNOSIS — R0602 Shortness of breath: Secondary | ICD-10-CM | POA: Diagnosis present

## 2020-11-17 LAB — CBC WITH DIFFERENTIAL/PLATELET
Abs Immature Granulocytes: 0.03 10*3/uL (ref 0.00–0.07)
Basophils Absolute: 0 10*3/uL (ref 0.0–0.1)
Basophils Relative: 0 %
Eosinophils Absolute: 0.1 10*3/uL (ref 0.0–0.5)
Eosinophils Relative: 1 %
HCT: 46.7 % — ABNORMAL HIGH (ref 36.0–46.0)
Hemoglobin: 15.3 g/dL — ABNORMAL HIGH (ref 12.0–15.0)
Immature Granulocytes: 0 %
Lymphocytes Relative: 15 %
Lymphs Abs: 1.2 10*3/uL (ref 0.7–4.0)
MCH: 31.4 pg (ref 26.0–34.0)
MCHC: 32.8 g/dL (ref 30.0–36.0)
MCV: 95.7 fL (ref 80.0–100.0)
Monocytes Absolute: 0.7 10*3/uL (ref 0.1–1.0)
Monocytes Relative: 9 %
Neutro Abs: 5.8 10*3/uL (ref 1.7–7.7)
Neutrophils Relative %: 75 %
Platelets: 263 10*3/uL (ref 150–400)
RBC: 4.88 MIL/uL (ref 3.87–5.11)
RDW: 13 % (ref 11.5–15.5)
WBC: 7.7 10*3/uL (ref 4.0–10.5)
nRBC: 0 % (ref 0.0–0.2)

## 2020-11-17 LAB — RESP PANEL BY RT-PCR (FLU A&B, COVID) ARPGX2
Influenza A by PCR: NEGATIVE
Influenza B by PCR: NEGATIVE
SARS Coronavirus 2 by RT PCR: NEGATIVE

## 2020-11-17 LAB — COMPREHENSIVE METABOLIC PANEL
ALT: 18 U/L (ref 0–44)
AST: 19 U/L (ref 15–41)
Albumin: 3.5 g/dL (ref 3.5–5.0)
Alkaline Phosphatase: 105 U/L (ref 38–126)
Anion gap: 13 (ref 5–15)
BUN: 17 mg/dL (ref 6–20)
CO2: 42 mmol/L — ABNORMAL HIGH (ref 22–32)
Calcium: 9.5 mg/dL (ref 8.9–10.3)
Chloride: 82 mmol/L — ABNORMAL LOW (ref 98–111)
Creatinine, Ser: 0.41 mg/dL — ABNORMAL LOW (ref 0.44–1.00)
GFR, Estimated: >60 mL/min (ref >60)
Glucose, Bld: 150 mg/dL — ABNORMAL HIGH (ref 70–99)
Potassium: 3.6 mmol/L (ref 3.5–5.1)
Sodium: 137 mmol/L (ref 135–145)
Total Bilirubin: 0.8 mg/dL (ref 0.3–1.2)
Total Protein: 7.6 g/dL (ref 6.5–8.1)

## 2020-11-17 LAB — BRAIN NATRIURETIC PEPTIDE: B Natriuretic Peptide: 52.7 pg/mL (ref 0.0–100.0)

## 2020-11-17 LAB — PROCALCITONIN: Procalcitonin: <0.1 ng/mL

## 2020-11-17 LAB — TROPONIN I (HIGH SENSITIVITY): Troponin I (High Sensitivity): 5 ng/L (ref <18)

## 2020-11-17 MED ORDER — PREDNISONE 20 MG PO TABS
50.0000 mg | ORAL_TABLET | Freq: Once | ORAL | Status: AC
  Administered 2020-11-17: 50 mg via ORAL
  Filled 2020-11-17: qty 2

## 2020-11-17 MED ORDER — ACETAMINOPHEN 500 MG PO TABS
1000.0000 mg | ORAL_TABLET | Freq: Once | ORAL | Status: AC
  Administered 2020-11-17: 1000 mg via ORAL
  Filled 2020-11-17: qty 2

## 2020-11-17 MED ORDER — PREDNISONE 10 MG PO TABS: 40.0000 mg | ORAL_TABLET | Freq: Every day | ORAL | 0 refills | Status: AC

## 2020-11-17 MED ORDER — PROCHLORPERAZINE EDISYLATE 10 MG/2ML IJ SOLN
10.0000 mg | Freq: Once | INTRAMUSCULAR | Status: AC
  Administered 2020-11-17: 10 mg via INTRAVENOUS
  Filled 2020-11-17: qty 2

## 2020-11-17 MED ORDER — IPRATROPIUM-ALBUTEROL 0.5-2.5 (3) MG/3ML IN SOLN
3.0000 mL | Freq: Once | RESPIRATORY_TRACT | Status: AC
  Administered 2020-11-17: 3 mL via RESPIRATORY_TRACT
  Filled 2020-11-17: qty 3

## 2020-11-17 MED ORDER — LIDOCAINE 5 % EX PTCH
1.0000 | MEDICATED_PATCH | CUTANEOUS | Status: DC
  Administered 2020-11-17: 1 via TRANSDERMAL
  Filled 2020-11-17: qty 1

## 2020-11-17 NOTE — ED Notes (Addendum)
Pt only able to ambulate a short distance in room. O2 remained around 91% on 3L.

## 2020-11-17 NOTE — ED Notes (Addendum)
Verbalized understanding discharge instructions, prescriptions, and follow-up. In no acute distress.   Pt asked to be wheeled outside of department to wait for her ride.  Pt remains on hospital O2 tank.

## 2020-11-17 NOTE — ED Provider Notes (Signed)
[facility] Emergency Department Provider Note  ____________________________________________   Event Date/Time   First MD Initiated Contact with Patient 11/17/20 1024     (approximate)  I have reviewed the triage vital signs and the nursing notes.   HISTORY  Chief Complaint Shortness of Breath   HPI Patty Leon is a 60 y.o. female with medical history significant for morbid obesity, chronic back pain, COPD on 3L home O2, nicotine dependence, DM, HTN, HDL, and DVT on Xarelto who presents for assessment of shortness of breath and productive cough that started yesterday.  Patient states she has had a mild headache.  She denies any hemoptysis, chest pain, abdominal pain, nausea, vomiting, diarrhea, dysuria, change in her chronic back pain, rash or extremity pain weakness numbness or tingling.  No earache, vision changes, vertigo, or other clear associated sick symptoms.  She was referred to the ED from cardiology outpatient office after she reportedly had a room air SPO2 of 80%.  However patient states that she had been wearing her home O2 when she was placed on the hospital nasal cannula at 3 L her SPO2 increased to 95%.         Past Medical History:  Diagnosis Date   Anxiety    Arthritis    COPD (chronic obstructive pulmonary disease) ([place])    Diabetes mellitus without complication ([place])    Diverticulosis    Fluid retention    GERD (gastroesophageal reflux disease)    History of blood clots    Hypercholesteremia    Hypertension    Obesity    Panic attacks    Shortness of breath dyspnea    Sleep apnea    Use C-PAP with oxygen    Patient Active Problem List   Diagnosis Date Noted   Acute exacerbation of CHF (congestive heart failure) ([place]) 10/17/2020   Acute respiratory failure ([place]) 10/16/2020   Nicotine dependence 10/16/2020   Acute CHF (congestive heart failure) ([place]) 10/16/2020   Chest pain 10/16/2020   Chronic respiratory failure with  hypoxia ([place]) 07/24/2020   Chronic anticoagulation 07/24/2020   History of DVT (deep vein thrombosis) 07/24/2020   COPD exacerbation ([place]) 07/24/2020   Obesity, Class III, BMI 40-49.9 (morbid obesity) ([place]) 07/24/2020   SIRS (systemic inflammatory response syndrome) ([place]) 06/03/2020   Diabetes ([place]) 06/03/2020   GERD (gastroesophageal reflux disease) 06/03/2020   Anxiety disorder 06/03/2020   Acute on chronic respiratory failure with hypoxia ([place]) 06/03/2020   COPD with acute exacerbation ([place]) 04/23/2017   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   Primary localized osteoarthritis of left knee 10/07/2015   Obstructive sleep apnea 08/05/2013   Benign essential hypertension 01/16/2011    Past Surgical History:  Procedure Laterality Date   BILATERAL CARPAL TUNNEL RELEASE     COLONOSCOPY     COLONOSCOPY N/A 01/22/2015   Procedure: COLONOSCOPY;  Surgeon: Josefine Class, MD;  Location: [facility] ENDOSCOPY;  Service: Endoscopy;  Laterality: N/A;   COLONOSCOPY WITH PROPOFOL N/A 07/12/2016   Procedure: COLONOSCOPY WITH PROPOFOL;  Surgeon: Manya Silvas, MD;  Location: [facility] ENDOSCOPY;  Service: Endoscopy;  Laterality: N/A;   COLONOSCOPY WITH PROPOFOL N/A 04/26/2018   Procedure: COLONOSCOPY WITH PROPOFOL;  Surgeon: Manya Silvas, MD;  Location: [facility] ENDOSCOPY;  Service: Endoscopy;  Laterality: N/A;   ESOPHAGOGASTRODUODENOSCOPY (EGD) WITH PROPOFOL N/A 04/26/2018   Procedure: ESOPHAGOGASTRODUODENOSCOPY (EGD) WITH PROPOFOL;  Surgeon: Manya Silvas, MD;  Location: [facility] ENDOSCOPY;  Service: Endoscopy;  Laterality: N/A;   JOINT REPLACEMENT  Left 09/2015   KNEE ARTHROSCOPY Left    KNEE CLOSED REDUCTION Left 11/04/2015   Procedure: CLOSED MANIPULATION KNEE;  Surgeon: Hessie Knows, MD;  Location: [facility] ORS;  Service: Orthopedics;  Laterality: Left;   KNEE CLOSED REDUCTION Left 04/04/2016   Procedure: CLOSED MANIPULATION KNEE;  Surgeon: Hessie Knows, MD;  Location: [facility] ORS;  Service:  Orthopedics;  Laterality: Left;   SHOULDER ARTHROSCOPY W/ ROTATOR CUFF REPAIR Right    TOTAL KNEE ARTHROPLASTY Left 10/07/2015   Procedure: TOTAL KNEE ARTHROPLASTY;  Surgeon: Hessie Knows, MD;  Location: [facility] ORS;  Service: Orthopedics;  Laterality: Left;   TOTAL KNEE REVISION Left 02/08/2016   Procedure: TOTAL KNEE REVISION;  Surgeon: Hessie Knows, MD;  Location: [facility] ORS;  Service: Orthopedics;  Laterality: Left;   TUBAL LIGATION      Prior to Admission medications   Medication Sig Start Date End Date Taking? Authorizing Provider  predniSONE (DELTASONE) 10 MG tablet Take 4 tablets (40 mg total) by mouth daily for 4 days. 11/17/20 11/21/20 Yes Lucrezia Starch, MD  albuterol (VENTOLIN HFA) 108 (90 Base) MCG/ACT inhaler Inhale 2 puffs into the lungs every 6 (six) hours as needed for wheezing.    [provider]  amLODipine (NORVASC) 5 MG tablet Take 5 mg by mouth every morning.     [provider]  cetirizine (ZYRTEC) 10 MG tablet 10 mg daily.    [provider]  Cholecalciferol (VITAMIN D) 2000 units CAPS Take 2,000 Units by mouth daily.    [provider]  citalopram (CELEXA) 10 MG tablet Take 10 mg by mouth at bedtime. 02/15/20   [provider]  docusate sodium (COLACE) 100 MG capsule Take 100 mg by mouth daily. 10/01/20   [provider]  ferrous sulfate 325 (65 FE) MG tablet Take by mouth.    [provider]  furosemide (LASIX) 40 MG tablet Take 1 tablet (40 mg total) by mouth daily for 7 days. 10/18/20 10/25/20  Enzo Bi, MD  hydrochlorothiazide (HYDRODIURIL) 25 MG tablet Take 0.5 tablets (12.5 mg total) by mouth daily. 07/27/20   Nita Sells, MD  JARDIANCE 25 MG TABS tablet Take 25 mg by mouth every morning. 06/23/20   [provider]  losartan (COZAAR) 25 MG tablet Take 25 mg by mouth daily.    [provider]  metFORMIN (GLUCOPHAGE) 500 MG tablet Take 500 mg by mouth 2 (two) times daily. 07/26/15    [provider]  metoprolol (LOPRESSOR) 50 MG tablet Take 50 mg by mouth every morning.  08/29/15   [provider]  nicotine (NICODERM CQ - DOSED IN MG/24 HOURS) 21 mg/24hr patch 21 mg daily. 08/29/20   [provider]  omeprazole (PRILOSEC) 40 MG capsule Take 40 mg by mouth every morning.  07/27/15   [provider]  oxyCODONE (OXY IR/ROXICODONE) 5 MG immediate release tablet Take 5 mg by mouth 3 (three) times daily as needed. 02/29/16   [provider]  OXYGEN Inhale into the lungs.    [provider]  potassium chloride SA (KLOR-CON) 20 MEQ tablet Take 2 tablets (40 mEq total) by mouth daily. 07/28/20   Nita Sells, MD  roflumilast (DALIRESP) 500 MCG TABS tablet Take 500 mcg by mouth every morning.     [provider]  rosuvastatin (CRESTOR) 10 MG tablet Take 10 mg by mouth every evening. 11/05/13   [provider]  TRELEGY ELLIPTA 100-62.5-25 MCG/INH AEPB Inhale into the lungs daily.    [provider]  XARELTO 20 MG TABS tablet Take 20 mg by mouth daily. 03/01/16   [provider]    Allergies Celebrex [celecoxib] and Dilaudid [hydromorphone]  Family History  Problem Relation Age of Onset   Lung cancer Mother    Ovarian cancer Paternal Grandmother    Breast cancer Neg Hx     Social History Social History   Tobacco Use   Smoking status: Every Day    Packs/day: 0.50    Years: 30.00    Pack years: 15.00    Types: Cigarettes   Smokeless tobacco: Never  Vaping Use   Vaping Use: Never used  Substance Use Topics   Alcohol use: Not Currently   Drug use: Not Currently    Types: Oxycodone    Comment: as prescribed by MD    Review of Systems  Review of Systems  Constitutional:  Negative for chills and fever.  HENT:  Negative for sore throat.   Eyes:  Negative for pain.  Respiratory:  Positive for cough, shortness of breath and wheezing. Negative for stridor.   Cardiovascular:   Negative for chest pain.  Gastrointestinal:  Negative for vomiting.  Genitourinary:  Negative for dysuria.  Musculoskeletal:  Positive for back pain (chronic).  Skin:  Negative for rash.  Neurological:  Positive for headaches (mild intermittent). Negative for seizures and loss of consciousness.  Psychiatric/Behavioral:  Negative for suicidal ideas.   All other systems reviewed and are negative.    ____________________________________________   PHYSICAL EXAM:  VITAL SIGNS: ED Triage Vitals [11/17/20 1017]  Enc Vitals Group     BP      Pulse      Resp      Temp      Temp src      SpO2      Weight 296 lb 4.8 oz (134.4 kg)     Height '5\' 2"'$  (1.575 m)     Head Circumference      Peak Flow      Pain Score 0     Pain Loc      Pain Edu?      Excl. in [place]?    Vitals:   11/17/20 1145 11/17/20 1354  BP:  134/85  Pulse: 91 91  Resp: (!) 25 (!) 22  Temp:    SpO2: 100% 92%   Physical Exam Vitals and nursing note reviewed.  Constitutional:      General: She is not in acute distress.    Appearance: She is well-developed. She is obese.  HENT:     Head: Normocephalic and atraumatic.     Right Ear: External ear normal.     Left Ear: External ear normal.     Nose: Nose normal.  Eyes:     Conjunctiva/sclera: Conjunctivae normal.  Cardiovascular:     Rate and Rhythm: Normal rate and regular rhythm.     Heart sounds: No murmur heard. Pulmonary:     Effort: Pulmonary effort is normal. No respiratory distress.     Breath sounds: Wheezing (mild end expiratory) present. No rhonchi or rales.  Abdominal:     Palpations: Abdomen is soft.     Tenderness: There is no abdominal tenderness.  Musculoskeletal:     Cervical back: Neck supple.     Right lower leg: No edema.     Left lower leg: No edema.  Skin:    General: Skin is warm and dry.     Capillary Refill: Capillary refill takes less than 2  seconds.  Neurological:     Mental Status: She is alert and oriented to person, place,  and time.  Psychiatric:        Mood and Affect: Mood normal.    Cranial nerves II through XII grossly intact.  Patient has symmetric strength in upper and lower extremities.  She is able to ambulate with a steady gait unassisted. ____________________________________________   LABS (all labs ordered are listed, but only abnormal results are displayed)  Labs Reviewed  CBC WITH DIFFERENTIAL/PLATELET - Abnormal; Notable for the following components:      Result Value   Hemoglobin 15.3 (*)    HCT 46.7 (*)    All other components within normal limits  COMPREHENSIVE METABOLIC PANEL - Abnormal; Notable for the following components:   Chloride 82 (*)    CO2 42 (*)    Glucose, Bld 150 (*)    Creatinine, Ser 0.41 (*)    All other components within normal limits  RESP PANEL BY RT-PCR (FLU A&B, COVID) ARPGX2  BRAIN NATRIURETIC PEPTIDE  PROCALCITONIN  TROPONIN I (HIGH SENSITIVITY)   ____________________________________________  EKG  Sinus rhythm with a ventricular rate of 92, nonspecific ST changes in anterior leads without other clear evidence of acute ischemia or significant arrhythmia.  ____________________________________________  RADIOLOGY  ED MD interpretation: Chest x-ray concerning for some pulmonary edema.  No large effusion, pneumothorax or clear focal consolidation.  Official radiology report(s): DG Chest Portable 1 View  Result Date: 11/17/2020 CLINICAL DATA:  Shortness of breath. EXAM: PORTABLE CHEST 1 VIEW COMPARISON:  10/16/2020 FINDINGS: The cardiac silhouette, mediastinal and hilar contours are within normal limits and stable. Prominent interstitial markings could suggest interstitial edema. No infiltrates or effusions. IMPRESSION: Prominent interstitial markings could suggest interstitial edema. Electronically Signed   By: Marijo Sanes M.D.   On: 11/17/2020 11:31    ____________________________________________   PROCEDURES  Procedure(s) performed (including  Critical Care):  .1-3 Lead EKG Interpretation  Date/Time: 11/17/2020 3:28 PM Performed by: Lucrezia Starch, MD Authorized by: Lucrezia Starch, MD     Interpretation: non-specific     ECG rate assessment: normal     Rhythm: sinus rhythm     Ectopy: none     Conduction: normal     ____________________________________________   INITIAL IMPRESSION / ASSESSMENT AND PLAN / ED COURSE      Patient presents with above to history exam for assessment of some cough and increased shortness of breath since yesterday.  It seems she had room air sats letter to cardiology office earlier today although is unclear why this was measured as patient states she has been wearing her oxygen although when her oxygen was changed her SPO2 increased to 95%.  Emergency room she was afebrile and hemodynamically stable on 3 L.  She has some very subtle and expiratory wheezing but otherwise did not appear in significant respiratory distress.  Differential includes bronchitis, pneumonia, anemia, CHF exacerbation with lower suspicion for PE given patient states she is compliant with her Xarelto.  ECG and nonelevated troponin are not suggestive of ACS  Some mild on x-ray wheezing will treat with DuoNeb and prednisone.  Chest x-ray has some bilateral haziness concerning for very mild edema.  Her BNP is double digits and overall patient does not appear globally volume overloaded on exam.  She is currently taking Lasix drip which requires additional emergent diuresis at this time.  Troponin is nonelevated at 5 and given was obtained greater than 3 hours after symptom onset with  otherwise patient denying any chest pain and reassuring EKG below suspicion for ACS.  CMP shows some chronic hyperchloremia and metabolic alkalosis without any acute electrolyte or metabolic derangements.  CBC shows no leukocytosis or acute anemia.  Procalcitonin is undetectable and overall Evalose patient for acute bacterial infection.  My  reassessment after patient received some DuoNeb steroids and Compazine she stated her headache and shortness of breath much improved.  She was ambulated around her room and was able to mean SPO2 greater 90% on her home 3 L.  She became briefly tachypneic at 22 with this but then this returned to breathing rate in the mid teens following this.  Patient states she is feeling much better.  Suspect likely mild COPD exacerbation.  Given she is feeling better and does not seem to have any significant acute hypoxic respiratory failure on top of chronic respiratory failure I think she is stable for discharge and close outpatient follow-up.  Discharged stable condition.  Strict return precautions advised and discussed.     ____________________________________________   FINAL CLINICAL IMPRESSION(S) / ED DIAGNOSES  Final diagnoses:  COPD exacerbation ([place])    Medications  lidocaine (LIDODERM) 5 % 1 patch (1 patch Transdermal Patch Applied 11/17/20 1110)  ipratropium-albuterol (DUONEB) 0.5-2.5 (3) MG/3ML nebulizer solution 3 mL (3 mLs Nebulization Given 11/17/20 1139)  predniSONE (DELTASONE) tablet 50 mg (50 mg Oral Given 11/17/20 1109)  acetaminophen (TYLENOL) tablet 1,000 mg (1,000 mg Oral Given 11/17/20 1108)  prochlorperazine (COMPAZINE) injection 10 mg (10 mg Intravenous Given 11/17/20 1436)     ED Discharge Orders          Ordered    predniSONE (DELTASONE) 10 MG tablet  Daily        11/17/20 1515             Note:  This document was prepared using Dragon voice recognition software and may include unintentional dictation errors.    Lucrezia Starch, MD 11/17/20 [phone]

## 2020-11-17 NOTE — ED Notes (Signed)
X-ray at bedside

## 2020-11-17 NOTE — ED Notes (Signed)
ED Provider at bedside. 

## 2020-11-17 NOTE — ED Notes (Signed)
Pt provided cracker and a diet ginger ale.  Asking for something further for a headache.  EDP made aware.

## 2020-11-17 NOTE — ED Triage Notes (Signed)
Arrives from Dr. Donnamarie Rossetti office for evaluation of SOB.  Per report, room air sats 80%.  Placed on 3l/[place], which patient wears at home.  Arrives to ED on 3l/ [place].  Oxygen tank changed.  Sats 95%.  HR:  95.  Skin warm and dry. NAD

## 2020-11-23 ENCOUNTER — Ambulatory Visit
Admission: RE | Admit: 2020-11-23 | Discharge: 2020-11-23 | Disposition: A | Discharge status: Home / Self Care | Payer: Medicaid Other | Source: Ambulatory Visit | Attending: Nurse Practitioner | Admitting: Nurse Practitioner

## 2020-11-23 ENCOUNTER — Other Ambulatory Visit: Payer: Self-pay

## 2020-11-23 DIAGNOSIS — Z1231 Encounter for screening mammogram for malignant neoplasm of breast: Secondary | ICD-10-CM | POA: Diagnosis present

## 2021-08-23 ENCOUNTER — Encounter: Payer: Self-pay | Admitting: Internal Medicine

## 2021-08-24 ENCOUNTER — Encounter
Admission: RE | Disposition: A | Discharge status: Home / Self Care | Payer: Self-pay | Source: Home / Self Care | Attending: Internal Medicine

## 2021-08-24 ENCOUNTER — Ambulatory Visit
Admission: RE | Admit: 2021-08-24 | Discharge: 2021-08-24 | Disposition: A | Discharge status: Home / Self Care | Payer: Medicaid Other | Attending: Internal Medicine | Admitting: Internal Medicine

## 2021-08-24 ENCOUNTER — Ambulatory Visit: Payer: Medicaid Other | Admitting: Anesthesiology

## 2021-08-24 DIAGNOSIS — J449 Chronic obstructive pulmonary disease, unspecified: Secondary | ICD-10-CM | POA: Diagnosis not present

## 2021-08-24 DIAGNOSIS — I10 Essential (primary) hypertension: Secondary | ICD-10-CM | POA: Insufficient documentation

## 2021-08-24 DIAGNOSIS — D123 Benign neoplasm of transverse colon: Secondary | ICD-10-CM | POA: Insufficient documentation

## 2021-08-24 DIAGNOSIS — F419 Anxiety disorder, unspecified: Secondary | ICD-10-CM | POA: Insufficient documentation

## 2021-08-24 DIAGNOSIS — E119 Type 2 diabetes mellitus without complications: Secondary | ICD-10-CM | POA: Insufficient documentation

## 2021-08-24 DIAGNOSIS — R0902 Hypoxemia: Secondary | ICD-10-CM | POA: Diagnosis not present

## 2021-08-24 DIAGNOSIS — K219 Gastro-esophageal reflux disease without esophagitis: Secondary | ICD-10-CM | POA: Insufficient documentation

## 2021-08-24 DIAGNOSIS — R0602 Shortness of breath: Secondary | ICD-10-CM | POA: Diagnosis not present

## 2021-08-24 DIAGNOSIS — K5730 Diverticulosis of large intestine without perforation or abscess without bleeding: Secondary | ICD-10-CM | POA: Insufficient documentation

## 2021-08-24 DIAGNOSIS — Z09 Encounter for follow-up examination after completed treatment for conditions other than malignant neoplasm: Secondary | ICD-10-CM | POA: Insufficient documentation

## 2021-08-24 DIAGNOSIS — Z7984 Long term (current) use of oral hypoglycemic drugs: Secondary | ICD-10-CM | POA: Diagnosis not present

## 2021-08-24 DIAGNOSIS — Z6841 Body Mass Index (BMI) 40.0 and over, adult: Secondary | ICD-10-CM | POA: Insufficient documentation

## 2021-08-24 DIAGNOSIS — G4730 Sleep apnea, unspecified: Secondary | ICD-10-CM | POA: Diagnosis not present

## 2021-08-24 HISTORY — PX: COLONOSCOPY: SHX5424

## 2021-08-24 LAB — GLUCOSE, CAPILLARY: Glucose-Capillary: 126 mg/dL — ABNORMAL HIGH (ref 70–99)

## 2021-08-24 SURGERY — COLONOSCOPY
Anesthesia: General

## 2021-08-24 MED ORDER — PROPOFOL 10 MG/ML IV BOLUS
INTRAVENOUS | Status: DC | PRN
  Administered 2021-08-24: 40 mg via INTRAVENOUS
  Administered 2021-08-24: 50 mg via INTRAVENOUS

## 2021-08-24 MED ORDER — PROPOFOL 500 MG/50ML IV EMUL
INTRAVENOUS | Status: DC | PRN
  Administered 2021-08-24: 80 ug/kg/min via INTRAVENOUS
  Administered 2021-08-24: 165 ug/kg/min via INTRAVENOUS

## 2021-08-24 MED ORDER — SODIUM CHLORIDE 0.9 % IV SOLN
INTRAVENOUS | Status: DC
  Administered 2021-08-24: 20 mL/h via INTRAVENOUS

## 2021-08-24 NOTE — Anesthesia Preprocedure Evaluation (Addendum)
Anesthesia Evaluation  Patient identified by MRN, date of birth, ID band Patient awake    Reviewed: Allergy & Precautions, NPO status , Patient's Chart, lab work & pertinent test results  History of Anesthesia Complications Negative for: history of anesthetic complications  Airway Mallampati: III  TM Distance: >3 FB Neck ROM: Full    Dental  (+) Upper Dentures, Missing   Pulmonary shortness of breath, with exertion, at rest and lying, sleep apnea and Continuous Positive Airway Pressure Ventilation , COPD,  COPD inhaler and oxygen dependent, Current Smoker,    - rhonchi + decreased breath sounds(-) wheezing      Cardiovascular Exercise Tolerance: Poor hypertension, Pt. on medications (-) CAD, (-) Past MI, (-) Cardiac Stents and (-) CABG  Rhythm:Regular Rate:Normal + Peripheral Edema- Systolic murmurs and - Diastolic murmurs ECHO 8/46: 1. Left ventricular ejection fraction, by estimation, is 55 to 60%. The  left ventricle has normal function. The left ventricle has no regional  wall motion abnormalities. Left ventricular diastolic parameters were  normal.  2. Right ventricular systolic function is normal. The right ventricular  size is normal.  3. The mitral valve was not well visualized. Trivial mitral valve  regurgitation.  4. The aortic valve was not well visualized. Aortic valve regurgitation  is trivial.   Neuro/Psych neg Seizures PSYCHIATRIC DISORDERS Anxiety negative neurological ROS     GI/Hepatic Neg liver ROS, GERD  Controlled,  Endo/Other  diabetes, Type 2, Oral Hypoglycemic AgentsMorbid obesity  Renal/GU negative Renal ROS     Musculoskeletal  (+) Arthritis ,   Abdominal (+) + obese,   Peds  Hematology negative hematology ROS (+)   Anesthesia Other Findings Past Medical History: No date: Anxiety No date: Arthritis No date: COPD (chronic obstructive pulmonary disease) (HCC) No date: Diabetes  mellitus without complication (HCC) No date: Diverticulosis No date: Fluid retention No date: GERD (gastroesophageal reflux disease) No date: History of blood clots No date: Hypercholesteremia No date: Hypertension No date: Obesity No date: Panic attacks No date: Shortness of breath dyspnea No date: Sleep apnea     Comment:  Use C-PAP with oxygen   Reproductive/Obstetrics                            Anesthesia Physical  Anesthesia Plan  ASA: III  Anesthesia Plan: General   Post-op Pain Management:    Induction: Intravenous  PONV Risk Score and Plan: 1 and Propofol infusion  Airway Management Planned: Natural Airway and Simple Face Mask  Additional Equipment:   Intra-op Plan:   Post-operative Plan:   Informed Consent: I have reviewed the patients History and Physical, chart, labs and discussed the procedure including the risks, benefits and alternatives for the proposed anesthesia with the patient or authorized representative who has indicated his/her understanding and acceptance.     Dental advisory given  Plan Discussed with: CRNA and Anesthesiologist  Anesthesia Plan Comments:        Anesthesia Quick Evaluation

## 2021-08-24 NOTE — Interval H&P Note (Signed)
History and Physical Interval Note:  08/24/2021 10:26 AM  Patty Leon  has presented today for surgery, with the diagnosis of Hx of adenomatous colonic polyps (Z86.010).  The various methods of treatment have been discussed with the patient and family. After consideration of risks, benefits and other options for treatment, the patient has consented to  Procedure(s) with comments: COLONOSCOPY (N/A) - DM as a surgical intervention.  The patient's history has been reviewed, patient examined, no change in status, stable for surgery.  I have reviewed the patient's chart and labs.  Questions were answered to the patient's satisfaction.     [place], [place]

## 2021-08-24 NOTE — Transfer of Care (Signed)
Immediate Anesthesia Transfer of Care Note  Patient: Patty Leon  Procedure(s) Performed: COLONOSCOPY  Patient Location: Endoscopy Unit  Anesthesia Type:General  Level of Consciousness: awake  Airway & Oxygen Therapy: Patient Spontanous Breathing and Patient connected to nasal cannula oxygen  Post-op Assessment: Report given to RN and Post -op Vital signs reviewed and stable  Post vital signs: Reviewed and stable  Last Vitals:  Vitals Value Taken Time  BP 121/68 08/24/21 1114  Temp 36.1 C 08/24/21 1114  Pulse 99 08/24/21 1114  Resp 16 08/24/21 1114  SpO2 96 % 08/24/21 1114    Last Pain:  Vitals:   08/24/21 1114  TempSrc: Temporal  PainSc:          Complications: No notable events documented.

## 2021-08-24 NOTE — Op Note (Signed)
[facility] Gastroenterology Patient Name: Patty Leon Procedure Date: 08/24/2021 10:48 AM MRN: [id number] Account #: [id number] Date of Birth: 1960-10-15 Admit Type: Outpatient Age: 61 Room: [facility] ENDO ROOM 2 Gender: Female Note Status: Finalized Instrument Name: Park Meo [id number] Procedure:             Colonoscopy Indications:           High risk colon cancer surveillance: Personal history                         of multiple (3 or more) adenomas Providers:             [name]. [name] MD, MD Referring MD:          Danelle Berry, NP Medicines:             Propofol per Anesthesia Complications:         No immediate complications. Estimated blood loss:                         None., Hypoxia, treated with administration of oxygen,                         treated locally, treated with ventilation Procedure:             Pre-Anesthesia Assessment:                        - The risks and benefits of the procedure and the                         sedation options and risks were discussed with the                         patient. All questions were answered and informed                         consent was obtained.                        - Patient identification and proposed procedure were                         verified prior to the procedure by the nurse. The                         procedure was verified in the procedure room.                        - ASA Grade Assessment: III - A patient with severe                         systemic disease.                        - After reviewing the risks and benefits, the patient                         was deemed in satisfactory condition to undergo the  procedure.                        After obtaining informed consent, the colonoscope was                         passed under direct vision. Throughout the procedure,                         the patient's blood pressure, pulse, and oxygen                          saturations were monitored continuously. The                         Colonoscope was introduced through the anus and                         advanced to the the cecum, identified by appendiceal                         orifice and ileocecal valve. The colonoscopy was                         performed without difficulty. The patient tolerated                         the procedure well. The quality of the bowel                         preparation was adequate. The ileocecal valve,                         appendiceal orifice, and rectum were photographed. Findings:      The perianal and digital rectal examinations were normal. Pertinent       negatives include normal sphincter tone and no palpable rectal lesions.      Many medium-mouthed diverticula were found in the sigmoid colon. There       was no evidence of diverticular bleeding.      A 3 mm polyp was found in the transverse colon. The polyp was sessile.       The polyp was removed with a jumbo cold forceps. Resection and retrieval       were complete.      The exam was otherwise without abnormality.      The retroflexed view of the distal rectum and anal verge was normal and       showed no anal or rectal abnormalities. Impression:            - Mild diverticulosis in the sigmoid colon. There was                         no evidence of diverticular bleeding.                        - One 3 mm polyp in the transverse colon, removed with                         a jumbo cold forceps. Resected and retrieved.                        -  The examination was otherwise normal. Recommendation:        - Patient has a contact number available for                         emergencies. The signs and symptoms of potential                         delayed complications were discussed with the patient.                         Return to normal activities tomorrow. Written                         discharge instructions were provided to the patient.                         - Resume previous diet.                        - Continue present medications.                        - Repeat colonoscopy is recommended for surveillance.                         The colonoscopy date will be determined after                         pathology results from today's exam become available                         for review.                        - Return to GI office PRN.                        - The findings and recommendations were discussed with                         the patient. Procedure Code(s):     --- Professional ---                        [phone], Colonoscopy, flexible; with biopsy, single or                         multiple Diagnosis Code(s):     --- Professional ---                        K57.30, Diverticulosis of large intestine without                         perforation or abscess without bleeding                        K63.5, Polyp of colon                        Z86.010, Personal history of colonic polyps CPT copyright 2019 American Medical Association. All rights reserved. The  codes documented in this report are preliminary and upon coder review may  be revised to meet current compliance requirements. Efrain Sella MD, MD 08/24/2021 11:12:43 AM This report has been signed electronically. Number of Addenda: 0 Note Initiated On: 08/24/2021 10:48 AM Scope Withdrawal Time: 0 hours 4 minutes 49 seconds  Total Procedure Duration: 0 hours 9 minutes 51 seconds  Estimated Blood Loss:  Estimated blood loss: none. Estimated blood loss: none.      [facility]

## 2021-08-24 NOTE — H&P (Signed)
Outpatient short stay form Pre-procedure 08/24/2021 10:25 AM Patty Leon Patty Leon, M.D.  Primary Physician: [name], FNP  Reason for visit:  Personal history of adenomatous colon polyps.  History of present illness:  -Patient is due for repeat surveillance at this time. Last colonoscopy 04/2018 with three small subcentimeter polyps removed from sigmoid colon with path showing early sessile serrated polyp x2 and tubular adenoma -No red flag symptoms such as changes in bowel habits, anemia, unintentional weight loss, rectal bleeding    Current Facility-Administered Medications:    0.9 %  sodium chloride infusion, , Intravenous, Continuous, [name], [name], MD  Medications Prior to Admission  Medication Sig Dispense Refill Last Dose   albuterol (VENTOLIN HFA) 108 (90 Base) MCG/ACT inhaler Inhale 2 puffs into the lungs every 6 (six) hours as needed for wheezing.   08/24/2021 at 0700   amLODipine (NORVASC) 5 MG tablet Take 5 mg by mouth every morning.    08/24/2021 at 0700   cetirizine (ZYRTEC) 10 MG tablet 10 mg daily.   08/23/2021   Cholecalciferol (VITAMIN D) 2000 units CAPS Take 2,000 Units by mouth daily.   08/23/2021   docusate sodium (COLACE) 100 MG capsule Take 100 mg by mouth daily.   08/23/2021   Exenatide ER (BYDUREON BCISE) 2 MG/0.85ML AUIJ Inject 2 mg into the skin once a week.   08/23/2021   ferrous sulfate 325 (65 FE) MG tablet Take by mouth.   Past Week   gabapentin (NEURONTIN) 300 MG capsule Take 300 mg by mouth 3 (three) times daily.   08/23/2021   hydrochlorothiazide (HYDRODIURIL) 25 MG tablet Take 0.5 tablets (12.5 mg total) by mouth daily.  2 08/23/2021   JARDIANCE 25 MG TABS tablet Take 25 mg by mouth every morning.   08/23/2021   linaclotide (LINZESS) 290 MCG CAPS capsule Take 290 mcg by mouth daily before breakfast.   08/23/2021   losartan (COZAAR) 25 MG tablet Take 25 mg by mouth daily.   08/23/2021   metFORMIN (GLUCOPHAGE) 500 MG tablet Take 500 mg by mouth 2 (two) times  daily.  2 08/23/2021   metoprolol (LOPRESSOR) 50 MG tablet Take 50 mg by mouth every morning.   5 08/23/2021   naloxone (NARCAN) nasal spray 4 mg/0.1 mL Place 1 spray into the nose as needed.   08/23/2021   nicotine (NICODERM CQ - DOSED IN MG/24 HOURS) 21 mg/24hr patch 21 mg daily.   08/23/2021   omeprazole (PRILOSEC) 40 MG capsule Take 40 mg by mouth every morning.   1 08/23/2021   oxyCODONE (OXY IR/ROXICODONE) 5 MG immediate release tablet Take 5 mg by mouth 3 (three) times daily as needed.  0 08/23/2021   OXYGEN Inhale into the lungs.   08/24/2021   potassium chloride SA (KLOR-CON) 20 MEQ tablet Take 2 tablets (40 mEq total) by mouth daily. 8 tablet 0 08/23/2021   roflumilast (DALIRESP) 500 MCG TABS tablet Take 500 mcg by mouth every morning.    08/24/2021 at 0700   rosuvastatin (CRESTOR) 10 MG tablet Take 10 mg by mouth every evening.   08/23/2021   TRELEGY ELLIPTA 100-62.5-25 MCG/INH AEPB Inhale into the lungs daily.   08/23/2021   XARELTO 20 MG TABS tablet Take 20 mg by mouth daily.  1 Past Week   citalopram (CELEXA) 10 MG tablet Take 10 mg by mouth at bedtime.      furosemide (LASIX) 40 MG tablet Take 1 tablet (40 mg total) by mouth daily for 7 days. 7 tablet 0  Allergies  Allergen Reactions   Celebrex [Celecoxib] Swelling   Dilaudid [Hydromorphone] Itching     Past Medical History:  Diagnosis Date   Anxiety    Arthritis    COPD (chronic obstructive pulmonary disease) (HCC)    Diabetes mellitus without complication ([place])    Diverticulosis    Fluid retention    GERD (gastroesophageal reflux disease)    History of blood clots    Hypercholesteremia    Hypertension    Obesity    Panic attacks    Shortness of breath dyspnea    Sleep apnea    Use C-PAP with oxygen    Review of systems:  Otherwise negative.    Physical Exam  Gen: Alert, oriented. Appears stated age.  HEENT: [place]/AT. PERRLA. Lungs: CTA, no wheezes. CV: RR nl S1, S2. Abd: soft, benign, no masses. BS+ Ext: No  edema. Pulses 2+    Planned procedures: Proceed with colonoscopy. The patient understands the nature of the planned procedure, indications, risks, alternatives and potential complications including but not limited to bleeding, infection, perforation, damage to internal organs and possible oversedation/side effects from anesthesia. The patient agrees and gives consent to proceed.  Please refer to procedure notes for findings, recommendations and patient disposition/instructions.     Kimberlyn Quiocho Patty Leon, M.D. Gastroenterology 08/24/2021  10:25 AM

## 2021-08-25 ENCOUNTER — Encounter: Payer: Self-pay | Admitting: Internal Medicine

## 2021-08-25 NOTE — Anesthesia Postprocedure Evaluation (Signed)
Anesthesia Post Note  Patient: Patty Leon  Procedure(s) Performed: COLONOSCOPY  Patient location during evaluation: Endoscopy Anesthesia Type: General Level of consciousness: awake and alert Pain management: pain level controlled Vital Signs Assessment: post-procedure vital signs reviewed and stable Respiratory status: spontaneous breathing, nonlabored ventilation and respiratory function stable Cardiovascular status: blood pressure returned to baseline and stable Postop Assessment: no apparent nausea or vomiting Anesthetic complications: no   No notable events documented.   Last Vitals:  Vitals:   08/24/21 1114 08/24/21 1134  BP: 121/68 132/78  Pulse: 99 90  Resp: 16   Temp: (!) 36.1 C   SpO2: 96% 94%    Last Pain:  Vitals:   08/24/21 1134  TempSrc:   PainSc: 0-No pain                 [place] Ouch

## 2021-08-26 LAB — SURGICAL PATHOLOGY

## 2021-08-31 ENCOUNTER — Encounter: Payer: Self-pay | Admitting: Emergency Medicine

## 2021-08-31 ENCOUNTER — Other Ambulatory Visit: Payer: Self-pay

## 2021-08-31 ENCOUNTER — Inpatient Hospital Stay
Admission: EM | Admit: 2021-08-31 | Discharge: 2021-09-02 | DRG: 291 | Disposition: A | Discharge status: Home / Self Care | Payer: Medicaid Other | Attending: Internal Medicine | Admitting: Internal Medicine

## 2021-08-31 ENCOUNTER — Emergency Department: Payer: Medicaid Other

## 2021-08-31 DIAGNOSIS — R000 Tachycardia, unspecified: Secondary | ICD-10-CM | POA: Diagnosis present

## 2021-08-31 DIAGNOSIS — J9620 Acute and chronic respiratory failure, unspecified whether with hypoxia or hypercapnia: Secondary | ICD-10-CM | POA: Diagnosis present

## 2021-08-31 DIAGNOSIS — I110 Hypertensive heart disease with heart failure: Secondary | ICD-10-CM | POA: Diagnosis present

## 2021-08-31 DIAGNOSIS — Z86718 Personal history of other venous thrombosis and embolism: Secondary | ICD-10-CM

## 2021-08-31 DIAGNOSIS — Z7984 Long term (current) use of oral hypoglycemic drugs: Secondary | ICD-10-CM | POA: Diagnosis not present

## 2021-08-31 DIAGNOSIS — Z6841 Body Mass Index (BMI) 40.0 and over, adult: Secondary | ICD-10-CM

## 2021-08-31 DIAGNOSIS — E1165 Type 2 diabetes mellitus with hyperglycemia: Secondary | ICD-10-CM | POA: Diagnosis not present

## 2021-08-31 DIAGNOSIS — Z888 Allergy status to other drugs, medicaments and biological substances status: Secondary | ICD-10-CM | POA: Diagnosis not present

## 2021-08-31 DIAGNOSIS — F410 Panic disorder [episodic paroxysmal anxiety] without agoraphobia: Secondary | ICD-10-CM | POA: Diagnosis present

## 2021-08-31 DIAGNOSIS — E119 Type 2 diabetes mellitus without complications: Secondary | ICD-10-CM

## 2021-08-31 DIAGNOSIS — F17210 Nicotine dependence, cigarettes, uncomplicated: Secondary | ICD-10-CM | POA: Diagnosis present

## 2021-08-31 DIAGNOSIS — Z96652 Presence of left artificial knee joint: Secondary | ICD-10-CM | POA: Diagnosis present

## 2021-08-31 DIAGNOSIS — J9621 Acute and chronic respiratory failure with hypoxia: Secondary | ICD-10-CM | POA: Diagnosis present

## 2021-08-31 DIAGNOSIS — E876 Hypokalemia: Secondary | ICD-10-CM | POA: Diagnosis present

## 2021-08-31 DIAGNOSIS — Z7901 Long term (current) use of anticoagulants: Secondary | ICD-10-CM | POA: Diagnosis not present

## 2021-08-31 DIAGNOSIS — I5033 Acute on chronic diastolic (congestive) heart failure: Secondary | ICD-10-CM | POA: Diagnosis present

## 2021-08-31 DIAGNOSIS — K219 Gastro-esophageal reflux disease without esophagitis: Secondary | ICD-10-CM | POA: Diagnosis present

## 2021-08-31 DIAGNOSIS — G4733 Obstructive sleep apnea (adult) (pediatric): Secondary | ICD-10-CM | POA: Diagnosis present

## 2021-08-31 DIAGNOSIS — J9622 Acute and chronic respiratory failure with hypercapnia: Secondary | ICD-10-CM | POA: Diagnosis present

## 2021-08-31 DIAGNOSIS — I10 Essential (primary) hypertension: Secondary | ICD-10-CM | POA: Diagnosis not present

## 2021-08-31 DIAGNOSIS — Z79899 Other long term (current) drug therapy: Secondary | ICD-10-CM

## 2021-08-31 DIAGNOSIS — F419 Anxiety disorder, unspecified: Secondary | ICD-10-CM | POA: Diagnosis present

## 2021-08-31 DIAGNOSIS — E7800 Pure hypercholesterolemia, unspecified: Secondary | ICD-10-CM | POA: Diagnosis present

## 2021-08-31 DIAGNOSIS — J441 Chronic obstructive pulmonary disease with (acute) exacerbation: Secondary | ICD-10-CM | POA: Diagnosis present

## 2021-08-31 DIAGNOSIS — Z885 Allergy status to narcotic agent status: Secondary | ICD-10-CM | POA: Diagnosis not present

## 2021-08-31 LAB — CBC WITH DIFFERENTIAL/PLATELET
Abs Immature Granulocytes: 0.01 10*3/uL (ref 0.00–0.07)
Basophils Absolute: 0 10*3/uL (ref 0.0–0.1)
Basophils Relative: 0 %
Eosinophils Absolute: 0 10*3/uL (ref 0.0–0.5)
Eosinophils Relative: 0 %
HCT: 47.2 % — ABNORMAL HIGH (ref 36.0–46.0)
Hemoglobin: 14.4 g/dL (ref 12.0–15.0)
Immature Granulocytes: 0 %
Lymphocytes Relative: 11 %
Lymphs Abs: 0.8 10*3/uL (ref 0.7–4.0)
MCH: 28.4 pg (ref 26.0–34.0)
MCHC: 30.5 g/dL (ref 30.0–36.0)
MCV: 93.1 fL (ref 80.0–100.0)
Monocytes Absolute: 0.4 10*3/uL (ref 0.1–1.0)
Monocytes Relative: 5 %
Neutro Abs: 6.3 10*3/uL (ref 1.7–7.7)
Neutrophils Relative %: 84 %
Platelets: 287 10*3/uL (ref 150–400)
RBC: 5.07 MIL/uL (ref 3.87–5.11)
RDW: 12.6 % (ref 11.5–15.5)
WBC: 7.5 10*3/uL (ref 4.0–10.5)
nRBC: 0 % (ref 0.0–0.2)

## 2021-08-31 LAB — COMPREHENSIVE METABOLIC PANEL
ALT: 14 U/L (ref 0–44)
AST: 15 U/L (ref 15–41)
Albumin: 3.5 g/dL (ref 3.5–5.0)
Alkaline Phosphatase: 90 U/L (ref 38–126)
Anion gap: 14 (ref 5–15)
BUN: 17 mg/dL (ref 6–20)
CO2: 42 mmol/L — ABNORMAL HIGH (ref 22–32)
Calcium: 9.9 mg/dL (ref 8.9–10.3)
Chloride: 84 mmol/L — ABNORMAL LOW (ref 98–111)
Creatinine, Ser: 0.57 mg/dL (ref 0.44–1.00)
Glucose, Bld: 121 mg/dL — ABNORMAL HIGH (ref 70–99)
Potassium: 3.4 mmol/L — ABNORMAL LOW (ref 3.5–5.1)
Sodium: 140 mmol/L (ref 135–145)
Total Bilirubin: 0.4 mg/dL (ref 0.3–1.2)
Total Protein: 8 g/dL (ref 6.5–8.1)

## 2021-08-31 LAB — TROPONIN I (HIGH SENSITIVITY)
Troponin I (High Sensitivity): 12 ng/L (ref <18)
Troponin I (High Sensitivity): 23 ng/L — ABNORMAL HIGH (ref <18)

## 2021-08-31 LAB — BRAIN NATRIURETIC PEPTIDE: B Natriuretic Peptide: 92.4 pg/mL (ref 0.0–100.0)

## 2021-08-31 LAB — MAGNESIUM: Magnesium: 2.1 mg/dL (ref 1.7–2.4)

## 2021-08-31 MED ORDER — GABAPENTIN 300 MG PO CAPS
ORAL_CAPSULE | ORAL | Status: AC
  Filled 2021-08-31: qty 1

## 2021-08-31 MED ORDER — PREDNISONE 20 MG PO TABS
40.0000 mg | ORAL_TABLET | Freq: Every day | ORAL | Status: DC
  Administered 2021-09-01: 40 mg via ORAL
  Filled 2021-08-31: qty 2

## 2021-08-31 MED ORDER — UMECLIDINIUM BROMIDE 62.5 MCG/ACT IN AEPB
1.0000 | INHALATION_SPRAY | Freq: Every day | RESPIRATORY_TRACT | Status: DC
  Administered 2021-09-01: 1 via RESPIRATORY_TRACT

## 2021-08-31 MED ORDER — HYDROCHLOROTHIAZIDE 12.5 MG PO TABS: 12.5000 mg | ORAL_TABLET | Freq: Every day | ORAL | Status: DC

## 2021-08-31 MED ORDER — FLUTICASONE FUROATE-VILANTEROL 100-25 MCG/ACT IN AEPB
1.0000 | INHALATION_SPRAY | Freq: Every day | RESPIRATORY_TRACT | Status: DC
  Administered 2021-09-01: 1 via RESPIRATORY_TRACT

## 2021-08-31 MED ORDER — NICOTINE 21 MG/24HR TD PT24
MEDICATED_PATCH | TRANSDERMAL | Status: AC
  Filled 2021-08-31: qty 1

## 2021-08-31 MED ORDER — METHYLPREDNISOLONE SODIUM SUCC 125 MG IJ SOLR
125.0000 mg | Freq: Once | INTRAMUSCULAR | Status: AC
  Administered 2021-08-31: 125 mg via INTRAVENOUS
  Filled 2021-08-31: qty 2

## 2021-08-31 MED ORDER — ACETAMINOPHEN 650 MG RE SUPP: 650.0000 mg | Freq: Four times a day (QID) | RECTAL | Status: DC | PRN

## 2021-08-31 MED ORDER — ACETAMINOPHEN 325 MG PO TABS: 650.0000 mg | ORAL_TABLET | Freq: Four times a day (QID) | ORAL | Status: DC | PRN

## 2021-08-31 MED ORDER — NICOTINE 21 MG/24HR TD PT24
21.0000 mg | MEDICATED_PATCH | Freq: Every day | TRANSDERMAL | Status: DC
  Administered 2021-09-02: 21 mg via TRANSDERMAL
  Filled 2021-08-31 (×2): qty 1

## 2021-08-31 MED ORDER — ACETAMINOPHEN 500 MG PO TABS
1000.0000 mg | ORAL_TABLET | Freq: Once | ORAL | Status: AC
Start: 2021-08-31 — End: 2021-08-31
  Administered 2021-08-31: 1000 mg via ORAL
  Filled 2021-08-31: qty 2

## 2021-08-31 MED ORDER — RIVAROXABAN 20 MG PO TABS
20.0000 mg | ORAL_TABLET | Freq: Every day | ORAL | Status: DC
  Administered 2021-09-01 – 2021-09-02 (×2): 20 mg via ORAL
  Filled 2021-08-31 (×2): qty 1

## 2021-08-31 MED ORDER — LOSARTAN POTASSIUM 25 MG PO TABS
25.0000 mg | ORAL_TABLET | Freq: Every day | ORAL | Status: DC
  Administered 2021-09-01 – 2021-09-02 (×2): 25 mg via ORAL
  Filled 2021-08-31 (×2): qty 1

## 2021-08-31 MED ORDER — METOPROLOL TARTRATE 50 MG PO TABS
50.0000 mg | ORAL_TABLET | ORAL | Status: DC
  Administered 2021-09-01 – 2021-09-02 (×2): 50 mg via ORAL
  Filled 2021-08-31 (×2): qty 1

## 2021-08-31 MED ORDER — IPRATROPIUM-ALBUTEROL 0.5-2.5 (3) MG/3ML IN SOLN
6.0000 mL | Freq: Once | RESPIRATORY_TRACT | Status: AC
  Administered 2021-08-31: 6 mL via RESPIRATORY_TRACT
  Filled 2021-08-31: qty 6

## 2021-08-31 MED ORDER — SODIUM CHLORIDE 0.9% FLUSH
3.0000 mL | Freq: Two times a day (BID) | INTRAVENOUS | Status: DC
  Administered 2021-08-31 – 2021-09-02 (×3): 3 mL via INTRAVENOUS

## 2021-08-31 MED ORDER — GABAPENTIN 300 MG PO CAPS
300.0000 mg | ORAL_CAPSULE | Freq: Three times a day (TID) | ORAL | Status: DC
  Administered 2021-08-31 – 2021-09-02 (×5): 300 mg via ORAL
  Filled 2021-08-31 (×5): qty 1

## 2021-08-31 MED ORDER — AMLODIPINE BESYLATE 5 MG PO TABS
5.0000 mg | ORAL_TABLET | ORAL | Status: DC
  Administered 2021-09-01: 5 mg via ORAL
  Filled 2021-08-31: qty 1

## 2021-08-31 MED ORDER — FUROSEMIDE 10 MG/ML IJ SOLN
60.0000 mg | Freq: Once | INTRAMUSCULAR | Status: AC
  Administered 2021-08-31: 60 mg via INTRAVENOUS
  Filled 2021-08-31: qty 8

## 2021-08-31 MED ORDER — ROFLUMILAST 500 MCG PO TABS
500.0000 ug | ORAL_TABLET | ORAL | Status: DC
  Administered 2021-09-01 – 2021-09-02 (×2): 500 ug via ORAL
  Filled 2021-08-31 (×2): qty 1

## 2021-08-31 MED ORDER — LIDOCAINE 5 % EX PTCH
1.0000 | MEDICATED_PATCH | CUTANEOUS | Status: DC
Start: 2021-08-31 — End: 2021-09-02
  Administered 2021-08-31: 1 via TRANSDERMAL
  Filled 2021-08-31: qty 1

## 2021-08-31 MED ORDER — INSULIN ASPART 100 UNIT/ML IJ SOLN
0.0000 [IU] | Freq: Three times a day (TID) | INTRAMUSCULAR | Status: DC
  Administered 2021-09-01: 8 [IU] via SUBCUTANEOUS
  Administered 2021-09-01: 11 [IU] via SUBCUTANEOUS
  Administered 2021-09-01: 5 [IU] via SUBCUTANEOUS
  Administered 2021-09-02: 11 [IU] via SUBCUTANEOUS
  Filled 2021-08-31 (×4): qty 1

## 2021-08-31 MED ORDER — METHYLPREDNISOLONE SODIUM SUCC 40 MG IJ SOLR
40.0000 mg | Freq: Two times a day (BID) | INTRAMUSCULAR | Status: DC
  Administered 2021-09-01 – 2021-09-02 (×3): 40 mg via INTRAVENOUS
  Filled 2021-08-31 (×3): qty 1

## 2021-08-31 MED ORDER — ALBUTEROL SULFATE (2.5 MG/3ML) 0.083% IN NEBU: 2.5000 mg | INHALATION_SOLUTION | Freq: Four times a day (QID) | RESPIRATORY_TRACT | Status: DC | PRN

## 2021-08-31 NOTE — H&P (Incomplete)
History and Physical    Patient: Patty Leon TKZ:[id number] DOB: 1960-12-13 DOA: 08/31/2021 DOS: the patient was seen and examined on 08/31/2021 PCP: Danelle Berry, NP  Patient coming from: Home  Chief Complaint:  Chief Complaint  Patient presents with  . Shortness of Breath   HPI: Patty Leon is a 61 y.o. female with medical history significant of COPD, hypertension, obstructive sleep apnea, diabetes mellitus type 2 presenting with complaints of shortness of breath.  Patient was hypoxic at 78% on room air and was placed on nasal cannula 5 L via EMS.  Patient does have chronic respiratory failure and wears 3 to 4 L at home.  Shortness of breath has been going on off and on for about a week now and also complains of chest tightness.  In the ED today patient got ABG which showed a pH of 7.47 a PCO2 of 78 PO2 of 62 and bicarb of 56.8. Patient otherwise denies any headaches blurred vision speech or gait issues chest pain palpitations fevers or chills nausea vomiting abdominal pain diarrhea any bowel or bladder complaints otherwise.   Review of Systems: As mentioned in the history of present illness. All other systems reviewed and are negative. Past Medical History:  Diagnosis Date  . Anxiety   . Arthritis   . COPD (chronic obstructive pulmonary disease) ([place])   . Diabetes mellitus without complication ([place])   . Diverticulosis   . Fluid retention   . GERD (gastroesophageal reflux disease)   . History of blood clots   . Hypercholesteremia   . Hypertension   . Obesity   . Panic attacks   . Shortness of breath dyspnea   . Sleep apnea    Use C-PAP with oxygen   Past Surgical History:  Procedure Laterality Date  . BILATERAL CARPAL TUNNEL RELEASE    . COLONOSCOPY    . COLONOSCOPY N/A 01/22/2015   Procedure: COLONOSCOPY;  Surgeon: Josefine Class, MD;  Location: [facility] ENDOSCOPY;  Service: Endoscopy;  Laterality: N/A;  . COLONOSCOPY N/A 08/24/2021   Procedure: COLONOSCOPY;   Surgeon: Tol[place], [name], MD;  Location: [facility] ENDOSCOPY;  Service: Gastroenterology;  Laterality: N/A;  DM  . COLONOSCOPY WITH PROPOFOL N/A 07/12/2016   Procedure: COLONOSCOPY WITH PROPOFOL;  Surgeon: Manya Silvas, MD;  Location: [facility] ENDOSCOPY;  Service: Endoscopy;  Laterality: N/A;  . COLONOSCOPY WITH PROPOFOL N/A 04/26/2018   Procedure: COLONOSCOPY WITH PROPOFOL;  Surgeon: Manya Silvas, MD;  Location: [facility] ENDOSCOPY;  Service: Endoscopy;  Laterality: N/A;  . ESOPHAGOGASTRODUODENOSCOPY (EGD) WITH PROPOFOL N/A 04/26/2018   Procedure: ESOPHAGOGASTRODUODENOSCOPY (EGD) WITH PROPOFOL;  Surgeon: Manya Silvas, MD;  Location: [facility] ENDOSCOPY;  Service: Endoscopy;  Laterality: N/A;  . JOINT REPLACEMENT Left 09/2015  . KNEE ARTHROSCOPY Left   . KNEE CLOSED REDUCTION Left 11/04/2015   Procedure: CLOSED MANIPULATION KNEE;  Surgeon: Hessie Knows, MD;  Location: [facility] ORS;  Service: Orthopedics;  Laterality: Left;  . KNEE CLOSED REDUCTION Left 04/04/2016   Procedure: CLOSED MANIPULATION KNEE;  Surgeon: Hessie Knows, MD;  Location: [facility] ORS;  Service: Orthopedics;  Laterality: Left;  . SHOULDER ARTHROSCOPY W/ ROTATOR CUFF REPAIR Right   . TOTAL KNEE ARTHROPLASTY Left 10/07/2015   Procedure: TOTAL KNEE ARTHROPLASTY;  Surgeon: Hessie Knows, MD;  Location: [facility] ORS;  Service: Orthopedics;  Laterality: Left;  . TOTAL KNEE REVISION Left 02/08/2016   Procedure: TOTAL KNEE REVISION;  Surgeon: Hessie Knows, MD;  Location: [facility] ORS;  Service: Orthopedics;  Laterality: Left;  . TUBAL  LIGATION     Social History:  reports that she has been smoking cigarettes. She has a 15.00 pack-year smoking history. She has never used smokeless tobacco. She reports that she does not currently use alcohol. She reports that she does not currently use drugs after having used the following drugs: Oxycodone.  Allergies  Allergen Reactions  . Celebrex [Celecoxib] Swelling  . Dilaudid [Hydromorphone] Itching  . Fentanyl Other  (See Comments)    Hallucinations.     Family History  Problem Relation Age of Onset  . Lung cancer Mother   . Ovarian cancer Paternal Grandmother   . Breast cancer Neg Hx     Prior to Admission medications   Medication Sig Start Date End Date Taking? Authorizing Provider  albuterol (VENTOLIN HFA) 108 (90 Base) MCG/ACT inhaler Inhale 2 puffs into the lungs every 6 (six) hours as needed for wheezing.    [provider]  amLODipine (NORVASC) 5 MG tablet Take 5 mg by mouth every morning.     [provider]  cetirizine (ZYRTEC) 10 MG tablet 10 mg daily.    [provider]  Cholecalciferol (VITAMIN D) 2000 units CAPS Take 2,000 Units by mouth daily.    [provider]  citalopram (CELEXA) 10 MG tablet Take 10 mg by mouth at bedtime. 02/15/20   [provider]  docusate sodium (COLACE) 100 MG capsule Take 100 mg by mouth daily. 10/01/20   [provider]  Exenatide ER (BYDUREON BCISE) 2 MG/0.85ML AUIJ Inject 2 mg into the skin once a week.    [provider]  ferrous sulfate 325 (65 FE) MG tablet Take by mouth.    [provider]  furosemide (LASIX) 40 MG tablet Take 1 tablet (40 mg total) by mouth daily for 7 days. 10/18/20 10/25/20  Enzo Bi, MD  gabapentin (NEURONTIN) 300 MG capsule Take 300 mg by mouth 3 (three) times daily.    [provider]  hydrochlorothiazide (HYDRODIURIL) 25 MG tablet Take 0.5 tablets (12.5 mg total) by mouth daily. 07/27/20   Nita Sells, MD  JARDIANCE 25 MG TABS tablet Take 25 mg by mouth every morning. 06/23/20   [provider]  linaclotide (LINZESS) 290 MCG CAPS capsule Take 290 mcg by mouth daily before breakfast.    [provider]  losartan (COZAAR) 25 MG tablet Take 25 mg by mouth daily.    [provider]  metFORMIN (GLUCOPHAGE) 500 MG tablet Take 500 mg by mouth 2 (two) times daily. 07/26/15   [provider]  metoprolol (LOPRESSOR) 50 MG  tablet Take 50 mg by mouth every morning.  08/29/15   [provider]  naloxone [facility]) nasal spray 4 mg/0.1 mL Place 1 spray into the nose as needed.    [provider]  nicotine (NICODERM CQ - DOSED IN MG/24 HOURS) 21 mg/24hr patch 21 mg daily. 08/29/20   [provider]  omeprazole (PRILOSEC) 40 MG capsule Take 40 mg by mouth every morning.  07/27/15   [provider]  oxyCODONE (OXY IR/ROXICODONE) 5 MG immediate release tablet Take 5 mg by mouth 3 (three) times daily as needed. 02/29/16   [provider]  OXYGEN Inhale into the lungs.    [provider]  potassium chloride SA (KLOR-CON) 20 MEQ tablet Take 2 tablets (40 mEq total) by mouth daily. 07/28/20   Nita Sells, MD  roflumilast (DALIRESP) 500 MCG TABS tablet Take 500 mcg by mouth every morning.     [provider]  rosuvastatin (CRESTOR) 10 MG tablet Take 10 mg by mouth every evening. 11/05/13   [provider]  TRELEGY ELLIPTA 100-62.5-25 MCG/INH AEPB Inhale into the lungs daily.    [provider]  XARELTO 20 MG TABS tablet Take 20 mg by mouth daily. 03/01/16   [provider]    Physical Exam: Vitals:   08/31/21 1743 08/31/21 1830 08/31/21 2007 08/31/21 2100  BP:  112/85 119/61 111/70  Pulse:  (!) 117 (!) 115 (!) 123  Resp:  (!) '24 20 16  '$ Temp:      TempSrc:      SpO2:  95% 90% 91%  Weight: 124.7 kg     Height: '5\' 2"'$  (1.575 m)     Physical Exam Vitals and nursing note reviewed.  Constitutional:      General: She is not in acute distress.    Appearance: Normal appearance. She is not ill-appearing, toxic-appearing or diaphoretic.  HENT:     Head: Normocephalic and atraumatic.     Right Ear: Hearing and external ear normal.     Left Ear: Hearing and external ear normal.     Nose: Nose normal. No nasal deformity.     Mouth/Throat:     Lips: Pink.     Mouth: Mucous membranes are moist.     Tongue: No lesions.     Pharynx:  Oropharynx is clear.  Eyes:     Extraocular Movements: Extraocular movements intact.     Pupils: Pupils are equal, round, and reactive to light.  Neck:     Vascular: No carotid bruit.  Cardiovascular:     Rate and Rhythm: Normal rate and regular rhythm.     Pulses: Normal pulses.     Heart sounds: Normal heart sounds.  Pulmonary:     Effort: Pulmonary effort is normal.     Breath sounds: Normal breath sounds.  Abdominal:     General: Bowel sounds are normal. There is no distension.     Palpations: Abdomen is soft. There is no mass.     Tenderness: There is no abdominal tenderness. There is no guarding.     Hernia: No hernia is present.  Musculoskeletal:     Right lower leg: No edema.     Left lower leg: No edema.  Skin:    General: Skin is warm.  Neurological:     General: No focal deficit present.     Mental Status: She is alert and oriented to person, place, and time.     Cranial Nerves: Cranial nerves 2-12 are intact.     Motor: Motor function is intact.  Psychiatric:        Attention and Perception: Attention normal.        Mood and Affect: Mood normal.        Speech: Speech normal.        Behavior: Behavior normal. Behavior is cooperative.        Cognition and Memory: Cognition normal.    Data Reviewed: Results for orders placed or performed during the hospital encounter of 08/31/21 (from the past 24 hour(s))  Brain natriuretic peptide     Status: None   Collection Time: 08/31/21  5:48 PM  Result Value Ref Range   B Natriuretic Peptide 92.4 0.0 - 100.0 pg/mL  CBC with Differential/Platelet     Status: Abnormal   Collection Time: 08/31/21  5:48 PM  Result Value Ref Range   WBC 7.5 4.0 - 10.5 K/uL   RBC  5.07 3.87 - 5.11 MIL/uL   Hemoglobin 14.4 12.0 - 15.0 g/dL   HCT 47.2 (H) 36.0 - 46.0 %   MCV 93.1 80.0 - 100.0 fL   MCH 28.4 26.0 - 34.0 pg   MCHC 30.5 30.0 - 36.0 g/dL   RDW 12.6 11.5 - 15.5 %   Platelets 287 150 - 400 K/uL   nRBC 0.0 0.0 - 0.2 %    Neutrophils Relative % 84 %   Neutro Abs 6.3 1.7 - 7.7 K/uL   Lymphocytes Relative 11 %   Lymphs Abs 0.8 0.7 - 4.0 K/uL   Monocytes Relative 5 %   Monocytes Absolute 0.4 0.1 - 1.0 K/uL   Eosinophils Relative 0 %   Eosinophils Absolute 0.0 0.0 - 0.5 K/uL   Basophils Relative 0 %   Basophils Absolute 0.0 0.0 - 0.1 K/uL   Immature Granulocytes 0 %   Abs Immature Granulocytes 0.01 0.00 - 0.07 K/uL  Comprehensive metabolic panel     Status: Abnormal   Collection Time: 08/31/21  5:48 PM  Result Value Ref Range   Sodium 140 135 - 145 mmol/L   Potassium 3.4 (L) 3.5 - 5.1 mmol/L   Chloride 84 (L) 98 - 111 mmol/L   CO2 42 (H) 22 - 32 mmol/L   Glucose, Bld 121 (H) 70 - 99 mg/dL   BUN 17 6 - 20 mg/dL   Creatinine, Ser 0.57 0.44 - 1.00 mg/dL   Calcium 9.9 8.9 - 10.3 mg/dL   Total Protein 8.0 6.5 - 8.1 g/dL   Albumin 3.5 3.5 - 5.0 g/dL   AST 15 15 - 41 U/L   ALT 14 0 - 44 U/L   Alkaline Phosphatase 90 38 - 126 U/L   Total Bilirubin 0.4 0.3 - 1.2 mg/dL   Anion gap 14.0 5 - 15  Troponin I (High Sensitivity)     Status: None   Collection Time: 08/31/21  5:48 PM  Result Value Ref Range   Troponin I (High Sensitivity) 12 <18 ng/L  Magnesium     Status: None   Collection Time: 08/31/21  5:48 PM  Result Value Ref Range   Magnesium 2.1 1.7 - 2.4 mg/dL  Blood gas, arterial     Status: Abnormal (Preliminary result)   Collection Time: 08/31/21  7:19 PM  Result Value Ref Range   O2 Content 4.0 L/min   Delivery systems NASAL CANNULA    pH, Arterial 7.47 (H) 7.35 - 7.45   pCO2 arterial 78 (HH) 32 - 48 mmHg   pO2, Arterial 62 (L) 83 - 108 mmHg   Bicarbonate 56.8 (H) 20.0 - 28.0 mmol/L   Acid-Base Excess 27.4 (H) 0.0 - 2.0 mmol/L   O2 Saturation 92.7 %   Patient temperature 37.0    Collection site LEFT RADIAL    Allens test (pass/fail) PENDING PASS  Troponin I (High Sensitivity)     Status: Abnormal   Collection Time: 08/31/21  7:25 PM  Result Value Ref Range   Troponin I (High Sensitivity)  23 (H) <18 ng/L     Assessment and Plan: No notes have been filed under this hospital service. Service: Hospitalist   Advance Care Planning:    Code Status: Prior   Consults:  None .  Family Communication:  Peggyann Shoals (Sister)  [phone] (Mobile)  Severity of Illness: The appropriate patient status for this patient is OBSERVATION. Observation status is judged to be reasonable and necessary in order to provide the required intensity of service to  ensure the patient's safety. The patient's presenting symptoms, physical exam findings, and initial radiographic and laboratory data in the context of their medical condition is felt to place them at decreased risk for further clinical deterioration. Furthermore, it is anticipated that the patient will be medically stable for discharge from the hospital within 2 midnights of admission.   Author: Para Skeans, MD 08/31/2021 9:31 PM  For on call review [email or web address].

## 2021-08-31 NOTE — ED Notes (Signed)
Pt presents to ED with c/o SOB. Pt states that the SOB has been going on for a week and got worse today. Pt c/o of chest tightness associated with SOB. Pt has a hx of COPD and wear 3-4L [place] chronically at home.   Pt appears dyspneic. Pt talking in complete sentences at this time.

## 2021-08-31 NOTE — ED Triage Notes (Signed)
Pt coming from family house via [facility]. Per EMS, pt oxygen tank went out and pt became SOB. When EMS got there pt was initially 78% on room air, EMS placed pt on 5L [place] and oxygen saturation went up to 90% on 5L [place]. Pt has a hx of COPD.   Pt states she wear 3-4L [place] at home.

## 2021-08-31 NOTE — ED Notes (Signed)
Hospitalist @ the bedside

## 2021-08-31 NOTE — ED Provider Notes (Signed)
[facility] Provider Note    Event Date/Time   First MD Initiated Contact with Patient 08/31/21 1730     (approximate)   History   Shortness of Breath   HPI  Patty Leon is a 61 y.o. female who presents to the ED for evaluation of Shortness of Breath   I reviewed pulmonary visit from 5/8.  History of COPD, obesity, dCHF, DM.  Anticoagulated on Xarelto.  Chronic 3-4 L O2.  Patient presents to the ED for elevation of 1 day of shortness of breath and feeling shaky.  She reports she was driving with her son but was feeling really shaky so he made her pull over and 911 was called.  Patient reports her breathing is been really bad just today.  She reports her supplemental oxygen was running out in the car today and blames it on this.  Denies any fevers, abdominal pain, emesis, productive cough.  Reports some mild chest tightness.  Physical Exam   Triage Vital Signs: ED Triage Vitals  Enc Vitals Group     BP      Pulse      Resp      Temp      Temp src      SpO2      Weight      Height      Head Circumference      Peak Flow      Pain Score      Pain Loc      Pain Edu?      Excl. in [place]?     Most recent vital signs: Vitals:   08/31/21 2007 08/31/21 2100  BP: 119/61 111/70  Pulse: (!) 115 (!) 123  Resp: 20 16  Temp:    SpO2: 90% 91%    General: Awake, no distress.  Obese.  Sitting up in bed and conversational in full sentences.  Looks well but her sats are only slowly improving with supplemental oxygen and eventually up to 90% on nasal cannula. CV:  Good peripheral perfusion.  Tachycardic and regular. Resp:  Tachypneic to the mid 20s.  No distress.  Speaking in full sentences.  Nearly silent breath sounds throughout.  No focal features. Abd:  No distention.  MSK:  No deformity noted.  Neuro:  No focal deficits appreciated. Other:     ED Results / Procedures / Treatments   Labs (all labs ordered are listed, but only abnormal results  are displayed) Labs Reviewed  CBC WITH DIFFERENTIAL/PLATELET - Abnormal; Notable for the following components:      Result Value   HCT 47.2 (*)    All other components within normal limits  COMPREHENSIVE METABOLIC PANEL - Abnormal; Notable for the following components:   Potassium 3.4 (*)    Chloride 84 (*)    CO2 42 (*)    Glucose, Bld 121 (*)    All other components within normal limits  BLOOD GAS, ARTERIAL - Abnormal; Notable for the following components:   pH, Arterial 7.47 (*)    pCO2 arterial 78 (*)    pO2, Arterial 62 (*)    Bicarbonate 56.8 (*)    Acid-Base Excess 27.4 (*)    All other components within normal limits  TROPONIN I (HIGH SENSITIVITY) - Abnormal; Notable for the following components:   Troponin I (High Sensitivity) 23 (*)    All other components within normal limits  BRAIN NATRIURETIC PEPTIDE  MAGNESIUM  TROPONIN I (HIGH SENSITIVITY)  EKG Sinus tachycardia with rate of 119 bpm.  Normal axis and intervals.  No evidence of acute ischemia.  RADIOLOGY 1 view CXR interpreted by me with pulmonary vascular congestion  Official radiology report(s): DG Chest Portable 1 View  Result Date: 08/31/2021 CLINICAL DATA:  Shortness of breath EXAM: PORTABLE CHEST 1 VIEW COMPARISON:  11/17/2020 FINDINGS: Transverse diameter of heart is slightly increased. Central pulmonary vessels are prominent. There is prominence interstitial markings in the parahilar regions and lower lung fields. There is no focal pulmonary consolidation. There is no pleural effusion or pneumothorax. IMPRESSION: Central pulmonary vessels are prominent. Prominence of interstitial markings in the parahilar regions and lower lung fields may suggest mild interstitial edema or interstitial pneumonia. There is no focal pulmonary consolidation. Electronically Signed   By: Elmer Picker M.D.   On: 08/31/2021 18:23    PROCEDURES and INTERVENTIONS:  .1-3 Lead EKG Interpretation  Performed by: Vladimir Crofts, MD Authorized by: Vladimir Crofts, MD     Interpretation: abnormal     ECG rate:  120   ECG rate assessment: tachycardic     Rhythm: sinus tachycardia     Ectopy: none     Conduction: normal   .Critical Care  Performed by: Vladimir Crofts, MD Authorized by: Vladimir Crofts, MD   Critical care provider statement:    Critical care time (minutes):  30   Critical care time was exclusive of:  Separately billable procedures and treating other patients   Critical care was necessary to treat or prevent imminent or life-threatening deterioration of the following conditions:  Respiratory failure   Critical care was time spent personally by me on the following activities:  Development of treatment plan with patient or surrogate, discussions with consultants, evaluation of patient's response to treatment, examination of patient, ordering and review of laboratory studies, ordering and review of radiographic studies, ordering and performing treatments and interventions, pulse oximetry, re-evaluation of patient's condition and review of old charts   Medications  lidocaine (LIDODERM) 5 % 1 patch (1 patch Transdermal Patch Applied 08/31/21 2031)  ipratropium-albuterol (DUONEB) 0.5-2.5 (3) MG/3ML nebulizer solution 6 mL (6 mLs Nebulization Given 08/31/21 1817)  methylPREDNISolone sodium succinate (SOLU-MEDROL) 125 mg/2 mL injection 125 mg (125 mg Intravenous Given 08/31/21 1814)  furosemide (LASIX) injection 60 mg (60 mg Intravenous Given 08/31/21 1919)  ipratropium-albuterol (DUONEB) 0.5-2.5 (3) MG/3ML nebulizer solution 6 mL (6 mLs Nebulization Given 08/31/21 1925)  acetaminophen (TYLENOL) tablet 1,000 mg (1,000 mg Oral Given 08/31/21 2031)     IMPRESSION / MDM / [place] / ED COURSE  I reviewed the triage vital signs and the nursing notes.  Differential diagnosis includes, but is not limited to, COPD exacerbation, CHF exacerbation, respiratory acidosis, hypercarbia, PE, ACS  {Patient presents  with symptoms of an acute illness or injury that is potentially life-threatening.  61 year old female presents to the ED with 1 day of shortness of breath and evidence of acute on chronic CHF and COPD requiring medical admission.  She looks volume overloaded and is tachycardic and tachypneic.  She is speaking in full sentences without distress though.  Not quite to the point of needing BiPAP.  Metabolic panel with chronically elevated CO2, normal white count and BNP.  She looks volume overload and x-ray is similarly congested so she received a single dose of IV Lasix.  Troponins are slowly rising, and may be due to her respiratory status.  EKG without ischemic features.  I doubt PE, she reports compliance with her Xarelto.  Her presenting tachycardia is likely due to her respiratory pathology with very poor airflow and COPD exacerbation.  ABG with normal pH and elevated PaCO2.  Due to her continued tachycardia and signs of COPD exacerbation, we will admit to medicine for further work-up and management.  Increased O2 requirement up to 6 L from her home 4 L.  Clinical Course as of 08/31/21 2133  Wed Aug 31, 2021  1924 Reassessed.  Patient sitting up in bed and speaking in full sentences conversing with her boyfriend and son.  She reports feeling okay.  Son provides some additional history as he was with her earlier today.  He reports that she was confused, acting weird and not herself.  We discussed the possibility of retaining CO2 causing this to my recommendation for an ABG.  She is agreeable.  We discussed more breathing treatments.  I reevaluated her and she is still with poor breath sounds.  Tachycardia remains.  She reports compliance with her anticoagulation.  She remains on 4 L nasal cannula. [DS]  2106 Reassessed, still tachy. I recommended admission and she's agreeable [DS]    Clinical Course User Index [DS] Vladimir Crofts, MD     FINAL CLINICAL IMPRESSION(S) / ED DIAGNOSES   Final diagnoses:   COPD exacerbation ([place])  Sinus tachycardia     Rx / DC Orders   ED Discharge Orders     None        Note:  This document was prepared using Dragon voice recognition software and may include unintentional dictation errors.   Vladimir Crofts, MD 08/31/21 2139

## 2021-08-31 NOTE — Progress Notes (Signed)
ABG puncture performed and resulted with abnormal values reported to RN. See results.

## 2021-08-31 NOTE — ED Notes (Signed)
RT @ the bedside. 

## 2021-08-31 NOTE — ED Notes (Signed)
BSC placed near the patient - call bell within reach. RT notified of the patients need for ABG.

## 2021-08-31 NOTE — ED Notes (Signed)
Dr. Posey Pronto notified of the patients request of the CPAP while sleeping - see orders for intervention.

## 2021-09-01 ENCOUNTER — Inpatient Hospital Stay
Admit: 2021-09-01 | Discharge: 2021-09-01 | Disposition: A | Discharge status: Home / Self Care | Payer: Medicaid Other | Attending: Internal Medicine | Admitting: Internal Medicine

## 2021-09-01 DIAGNOSIS — E876 Hypokalemia: Secondary | ICD-10-CM | POA: Diagnosis not present

## 2021-09-01 DIAGNOSIS — I10 Essential (primary) hypertension: Secondary | ICD-10-CM

## 2021-09-01 DIAGNOSIS — J441 Chronic obstructive pulmonary disease with (acute) exacerbation: Secondary | ICD-10-CM | POA: Diagnosis not present

## 2021-09-01 DIAGNOSIS — J9621 Acute and chronic respiratory failure with hypoxia: Secondary | ICD-10-CM | POA: Diagnosis not present

## 2021-09-01 DIAGNOSIS — K219 Gastro-esophageal reflux disease without esophagitis: Secondary | ICD-10-CM

## 2021-09-01 DIAGNOSIS — E1165 Type 2 diabetes mellitus with hyperglycemia: Secondary | ICD-10-CM

## 2021-09-01 DIAGNOSIS — I5033 Acute on chronic diastolic (congestive) heart failure: Secondary | ICD-10-CM

## 2021-09-01 DIAGNOSIS — G4733 Obstructive sleep apnea (adult) (pediatric): Secondary | ICD-10-CM

## 2021-09-01 DIAGNOSIS — Z7901 Long term (current) use of anticoagulants: Secondary | ICD-10-CM

## 2021-09-01 LAB — CBG MONITORING, ED
Glucose-Capillary: 348 mg/dL — ABNORMAL HIGH (ref 70–99)
Glucose-Capillary: 232 mg/dL — ABNORMAL HIGH (ref 70–99)

## 2021-09-01 LAB — CBC
HCT: 46.1 % — ABNORMAL HIGH (ref 36.0–46.0)
Hemoglobin: 14 g/dL (ref 12.0–15.0)
MCH: 28.2 pg (ref 26.0–34.0)
MCHC: 30.4 g/dL (ref 30.0–36.0)
MCV: 92.9 fL (ref 80.0–100.0)
Platelets: 281 10*3/uL (ref 150–400)
RBC: 4.96 MIL/uL (ref 3.87–5.11)
RDW: 12.8 % (ref 11.5–15.5)
WBC: 5.9 10*3/uL (ref 4.0–10.5)
nRBC: 0 % (ref 0.0–0.2)

## 2021-09-01 LAB — COMPREHENSIVE METABOLIC PANEL
ALT: 14 U/L (ref 0–44)
AST: 21 U/L (ref 15–41)
Albumin: 3.3 g/dL — ABNORMAL LOW (ref 3.5–5.0)
Alkaline Phosphatase: 85 U/L (ref 38–126)
Anion gap: 15 (ref 5–15)
BUN: 18 mg/dL (ref 6–20)
CO2: 42 mmol/L — ABNORMAL HIGH (ref 22–32)
Calcium: 9.7 mg/dL (ref 8.9–10.3)
Chloride: 82 mmol/L — ABNORMAL LOW (ref 98–111)
Creatinine, Ser: 0.65 mg/dL (ref 0.44–1.00)
GFR, Estimated: >60 mL/min (ref >60)
Glucose, Bld: 336 mg/dL — ABNORMAL HIGH (ref 70–99)
Potassium: 3.2 mmol/L — ABNORMAL LOW (ref 3.5–5.1)
Sodium: 139 mmol/L (ref 135–145)
Total Bilirubin: 0.2 mg/dL — ABNORMAL LOW (ref 0.3–1.2)
Total Protein: 7.8 g/dL (ref 6.5–8.1)

## 2021-09-01 LAB — ECHOCARDIOGRAM COMPLETE BUBBLE STUDY: S' Lateral: 2.5 cm

## 2021-09-01 LAB — HEMOGLOBIN A1C
Hgb A1c MFr Bld: 7.2 % — ABNORMAL HIGH (ref 4.8–5.6)
Mean Plasma Glucose: 159.94 mg/dL

## 2021-09-01 LAB — MAGNESIUM: Magnesium: 2.1 mg/dL (ref 1.7–2.4)

## 2021-09-01 LAB — GLUCOSE, CAPILLARY
Glucose-Capillary: 284 mg/dL — ABNORMAL HIGH (ref 70–99)
Glucose-Capillary: 242 mg/dL — ABNORMAL HIGH (ref 70–99)

## 2021-09-01 MED ORDER — EMPAGLIFLOZIN 25 MG PO TABS
25.0000 mg | ORAL_TABLET | Freq: Every morning | ORAL | Status: DC
  Administered 2021-09-02: 25 mg via ORAL
  Filled 2021-09-01: qty 1

## 2021-09-01 MED ORDER — AZITHROMYCIN 500 MG PO TABS
500.0000 mg | ORAL_TABLET | Freq: Every day | ORAL | Status: AC
  Administered 2021-09-01: 500 mg via ORAL
  Filled 2021-09-01: qty 1

## 2021-09-01 MED ORDER — FUROSEMIDE 40 MG PO TABS
40.0000 mg | ORAL_TABLET | Freq: Every day | ORAL | Status: DC
Start: 2021-09-01 — End: 2021-09-02
  Administered 2021-09-01: 40 mg via ORAL
  Filled 2021-09-01: qty 1

## 2021-09-01 MED ORDER — POTASSIUM CHLORIDE CRYS ER 20 MEQ PO TBCR
20.0000 meq | EXTENDED_RELEASE_TABLET | Freq: Every day | ORAL | Status: DC
  Administered 2021-09-01 – 2021-09-02 (×2): 20 meq via ORAL
  Filled 2021-09-01 (×2): qty 1

## 2021-09-01 MED ORDER — IPRATROPIUM-ALBUTEROL 0.5-2.5 (3) MG/3ML IN SOLN
3.0000 mL | RESPIRATORY_TRACT | Status: DC | PRN
  Administered 2021-09-01: 3 mL via RESPIRATORY_TRACT
  Filled 2021-09-01: qty 3

## 2021-09-01 MED ORDER — AZITHROMYCIN 250 MG PO TABS
250.0000 mg | ORAL_TABLET | Freq: Every day | ORAL | Status: DC
  Administered 2021-09-02: 250 mg via ORAL
  Filled 2021-09-01: qty 1

## 2021-09-01 MED ORDER — INSULIN ASPART 100 UNIT/ML IJ SOLN
0.0000 [IU] | Freq: Every day | INTRAMUSCULAR | Status: DC
  Administered 2021-09-01: 2 [IU] via SUBCUTANEOUS
  Filled 2021-09-01: qty 1

## 2021-09-01 MED ORDER — PANTOPRAZOLE SODIUM 40 MG PO TBEC
40.0000 mg | DELAYED_RELEASE_TABLET | Freq: Every day | ORAL | Status: DC
Start: 2021-09-01 — End: 2021-09-02
  Administered 2021-09-01 – 2021-09-02 (×2): 40 mg via ORAL
  Filled 2021-09-01 (×2): qty 1

## 2021-09-01 MED ORDER — OXYCODONE HCL 5 MG PO TABS
5.0000 mg | ORAL_TABLET | Freq: Four times a day (QID) | ORAL | Status: DC | PRN
  Administered 2021-09-01 – 2021-09-02 (×3): 5 mg via ORAL
  Filled 2021-09-01 (×3): qty 1

## 2021-09-01 MED ORDER — ROSUVASTATIN CALCIUM 10 MG PO TABS
40.0000 mg | ORAL_TABLET | Freq: Every day | ORAL | Status: DC
  Administered 2021-09-01: 40 mg via ORAL
  Filled 2021-09-01: qty 4

## 2021-09-01 MED ORDER — INSULIN ASPART 100 UNIT/ML IJ SOLN: 0.0000 [IU] | Freq: Three times a day (TID) | INTRAMUSCULAR | Status: DC

## 2021-09-01 MED ORDER — INSULIN GLARGINE-YFGN 100 UNIT/ML ~~LOC~~ SOLN
12.0000 [IU] | Freq: Every day | SUBCUTANEOUS | Status: DC
  Administered 2021-09-01 – 2021-09-02 (×2): 12 [IU] via SUBCUTANEOUS
  Filled 2021-09-01 (×2): qty 0.12

## 2021-09-01 NOTE — Progress Notes (Signed)
CPAP/NIV placed on PT with a 4L O2 bleed. PT mildly tolerating at this time, but willing to try wearing.

## 2021-09-01 NOTE — Assessment & Plan Note (Addendum)
Continue Xarelto 

## 2021-09-01 NOTE — Assessment & Plan Note (Addendum)
Replace oral potassium especially while giving Lasix.

## 2021-09-01 NOTE — Assessment & Plan Note (Addendum)
PCO2 of 72 on presentation.  Patient qualifies for BiPAP at night.  Ordered BiPAP at night.  I will have the patient look into whether or not the patient actually has a BiPAP at home.  The patient states that she does.

## 2021-09-01 NOTE — ED Notes (Signed)
RT @ the bedside to set up CPAP.

## 2021-09-01 NOTE — Assessment & Plan Note (Addendum)
Since the patient is on steroids we will add low-dose Semglee insulin.  Continue Jardiance and sliding scale insulin.  Hemoglobin A1c 7.2.

## 2021-09-01 NOTE — Assessment & Plan Note (Addendum)
Patient qualifies for BiPAP

## 2021-09-01 NOTE — Assessment & Plan Note (Addendum)
Continue Solu-Medrol, nebulizer treatments.  Empiric Zithromax.

## 2021-09-01 NOTE — Assessment & Plan Note (Signed)
We will give another dose of IV Lasix today since improved yesterday with Lasix.  BNP is not elevated but will continue to monitor.  Echocardiogram shows normal EF.

## 2021-09-01 NOTE — ED Notes (Signed)
Pt given a cup of sprite and saltine crackers per request. Pt asking for a CPAP, Alycia, RN will call respiratory. Pt ambulatory to the bathroom. Hand hygiene preformed. Pt placed back on cardiac monitor.

## 2021-09-01 NOTE — Progress Notes (Signed)
*  PRELIMINARY RESULTS* Echocardiogram 2D Echocardiogram has been performed.  Sherrie Sport 09/01/2021, 11:42 AM

## 2021-09-01 NOTE — Progress Notes (Signed)
Progress Note   Patient: Patty Leon RSW:[id number] DOB: [birth year] DOA: 08/31/2021     1 DOS: the patient was seen and examined on 09/01/2021     Assessment and Plan: * Acute on chronic respiratory failure with hypoxia and hypercapnia (HCC) PCO2 of 72 on presentation.  Patient qualifies for BiPAP at night.  Ordered BiPAP at night.  I will have the patient look into whether or not the patient actually has a BiPAP at home.  The patient states that she does.   COPD with acute exacerbation (HCC) Continue Solu-Medrol, nebulizer treatments.  Empiric Zithromax.   Acute on chronic diastolic CHF (congestive heart failure) ([place]) We will give another dose of IV Lasix today since improved yesterday with Lasix.  BNP is not elevated but will continue to monitor.  Echocardiogram shows normal EF.  Hypokalemia Replace oral potassium especially while giving Lasix.   Benign essential hypertension Essential hypertension.  Continue metoprolol and losartan and Lasix.  We will hold off on Norvasc and hydrochlorothiazide for now.   Uncontrolled type 2 diabetes mellitus with hyperglycemia, without long-term current use of insulin ([place]) Since the patient is on steroids we will add low-dose Semglee insulin.  Continue Jardiance and sliding scale insulin.  Hemoglobin A1c 7.2.   Obstructive sleep apnea Patient qualifies for BiPAP  Chronic anticoagulation Continue Xarelto  GERD (gastroesophageal reflux disease) P.O. PPI therapy.          Subjective: Patient coming in because she could not breathe.  Feels a little bit better now.  Received a dose of Lasix and Solu-Medrol nebulizer treatments upon coming in.  Admitted with acute on chronic respiratory failure with hypoxia and hypercapnia.  The patient also stated that she ran out of oxygen and her oxygen tank while she was out.  Physical Exam: Vitals:   09/01/21 0618 09/01/21 0638 09/01/21 1015 09/01/21 1247  BP: (!) 133/97  (!) 133/97 98/62   Pulse: (!) 101 97 87 92  Resp: '20 19 14 16  '$ Temp:    97.9 F (36.6 C)  TempSrc:    Oral  SpO2: 91% 95% 91% 92%  Weight:      Height:       Physical Exam HENT:     Head: Normocephalic.     Mouth/Throat:     Pharynx: No oropharyngeal exudate.  Eyes:     General: Lids are normal.     Conjunctiva/sclera: Conjunctivae normal.  Cardiovascular:     Rate and Rhythm: Normal rate and regular rhythm.     Heart sounds: Normal heart sounds, S1 normal and S2 normal.  Pulmonary:     Breath sounds: Examination of the right-lower field reveals decreased breath sounds and rhonchi. Examination of the left-lower field reveals decreased breath sounds and rhonchi. Decreased breath sounds and rhonchi present. No wheezing or rales.  Abdominal:     Palpations: Abdomen is soft.     Tenderness: There is no abdominal tenderness.  Musculoskeletal:     Right lower leg: No swelling.     Left lower leg: No swelling.  Skin:    General: Skin is warm.     Findings: No rash.  Neurological:     Mental Status: She is alert and oriented to person, place, and time.     Data Reviewed: BNP 92.4, on ABG PCO2 was 78, potassium 3.2, creatinine 0.65, hemoglobin 14, white blood cell count 5.9, hemoglobin A1c 7.2  Disposition: Status is: Inpatient Remains inpatient appropriate because: Giving a dose of IV Lasix  and continuing IV Solu-Medrol today with no wheezes heard at bilateral lung bases. Planned Discharge Destination: Home  Author: Loletha Grayer, MD 09/01/2021 1:08 PM  For on call review [email or web address].

## 2021-09-01 NOTE — ED Notes (Signed)
Informed RN bed assigned 

## 2021-09-01 NOTE — Assessment & Plan Note (Addendum)
Essential hypertension.  Continue metoprolol and losartan and Lasix.  We will hold off on Norvasc and hydrochlorothiazide for now.

## 2021-09-01 NOTE — Progress Notes (Signed)
Admission profile updated. ?

## 2021-09-01 NOTE — Progress Notes (Signed)
Crossd Cover Patient reports use of duo neb at home TID and is requesting.  Orderd now as needed every 4 hours

## 2021-09-01 NOTE — ED Notes (Signed)
Pt refusing to keep the CPAP machine on while asleep and was switched to HFNC @ 5L.

## 2021-09-01 NOTE — Assessment & Plan Note (Addendum)
P.O. PPI therapy.

## 2021-09-02 LAB — MAGNESIUM: Magnesium: 2.5 mg/dL — ABNORMAL HIGH (ref 1.7–2.4)

## 2021-09-02 LAB — GLUCOSE, CAPILLARY: Glucose-Capillary: 305 mg/dL — ABNORMAL HIGH (ref 70–99)

## 2021-09-02 LAB — BASIC METABOLIC PANEL
BUN: 21 mg/dL — ABNORMAL HIGH (ref 6–20)
CO2: >45 mmol/L — ABNORMAL HIGH (ref 22–32)
Calcium: 10 mg/dL (ref 8.9–10.3)
Chloride: 88 mmol/L — ABNORMAL LOW (ref 98–111)
Creatinine, Ser: 0.5 mg/dL (ref 0.44–1.00)
GFR, Estimated: >60 mL/min (ref >60)
Glucose, Bld: 248 mg/dL — ABNORMAL HIGH (ref 70–99)
Potassium: 3.6 mmol/L (ref 3.5–5.1)
Sodium: 141 mmol/L (ref 135–145)

## 2021-09-02 LAB — HIV ANTIBODY (ROUTINE TESTING W REFLEX): HIV Screen 4th Generation wRfx: NONREACTIVE

## 2021-09-02 MED ORDER — FUROSEMIDE 20 MG PO TABS
20.0000 mg | ORAL_TABLET | Freq: Every day | ORAL | Status: DC
  Administered 2021-09-02: 20 mg via ORAL
  Filled 2021-09-02: qty 1

## 2021-09-02 MED ORDER — FUROSEMIDE 20 MG PO TABS
20.0000 mg | ORAL_TABLET | Freq: Every day | ORAL | 0 refills | Status: DC
Start: 2021-09-02 — End: 2023-09-07

## 2021-09-02 MED ORDER — PREDNISONE 10 MG PO TABS
ORAL_TABLET | ORAL | 0 refills | Status: DC
Start: 2021-09-02 — End: 2021-10-19

## 2021-09-02 MED ORDER — NICOTINE 21 MG/24HR TD PT24: 21.0000 mg | MEDICATED_PATCH | Freq: Every day | TRANSDERMAL | 0 refills | Status: DC

## 2021-09-02 MED ORDER — IPRATROPIUM-ALBUTEROL 0.5-2.5 (3) MG/3ML IN SOLN: 3.0000 mL | Freq: Four times a day (QID) | RESPIRATORY_TRACT | Status: DC

## 2021-09-02 MED ORDER — AZITHROMYCIN 250 MG PO TABS: ORAL_TABLET | ORAL | 0 refills | Status: DC

## 2021-09-02 NOTE — Progress Notes (Signed)
Pt discharged per MD order. IV removed. Discharge instructions reviewed with pt. Pt verbalized understanding. All questions answered to pt satisfaction. Family brought in home oxygen. Pt wheeled out in wheelchair by friend.

## 2021-09-05 ENCOUNTER — Telehealth: Payer: Self-pay

## 2021-09-05 NOTE — Telephone Encounter (Signed)
Received hospital referral for patient to be seen in the Heart Failure Clinic. Unable to reach patient at any number. Left voicemail to attempt to set up new patient appointment on mobile number.

## 2021-09-07 LAB — BLOOD GAS, ARTERIAL
Acid-Base Excess: 27.4 mmol/L — ABNORMAL HIGH (ref 0.0–2.0)
Bicarbonate: 56.8 mmol/L — ABNORMAL HIGH (ref 20.0–28.0)
O2 Content: 4 L/min
O2 Saturation: 92.7 %
Patient temperature: 37
pCO2 arterial: 78 mmHg (ref 32–48)
pH, Arterial: 7.47 — ABNORMAL HIGH (ref 7.35–7.45)
pO2, Arterial: 62 mmHg — ABNORMAL LOW (ref 83–108)

## 2021-10-19 ENCOUNTER — Emergency Department
Admission: EM | Admit: 2021-10-19 | Discharge: 2021-10-19 | Disposition: A | Discharge status: Home / Self Care | Payer: Medicaid Other | Attending: Emergency Medicine | Admitting: Emergency Medicine

## 2021-10-19 ENCOUNTER — Encounter: Payer: Self-pay | Admitting: Emergency Medicine

## 2021-10-19 ENCOUNTER — Other Ambulatory Visit: Payer: Self-pay

## 2021-10-19 DIAGNOSIS — I10 Essential (primary) hypertension: Secondary | ICD-10-CM | POA: Diagnosis not present

## 2021-10-19 DIAGNOSIS — E119 Type 2 diabetes mellitus without complications: Secondary | ICD-10-CM | POA: Insufficient documentation

## 2021-10-19 DIAGNOSIS — M545 Low back pain, unspecified: Secondary | ICD-10-CM | POA: Diagnosis present

## 2021-10-19 DIAGNOSIS — J449 Chronic obstructive pulmonary disease, unspecified: Secondary | ICD-10-CM | POA: Diagnosis not present

## 2021-10-19 LAB — URINALYSIS, ROUTINE W REFLEX MICROSCOPIC
Bacteria, UA: NONE SEEN
Bilirubin Urine: NEGATIVE
Glucose, UA: >=500 mg/dL — AB
Hgb urine dipstick: NEGATIVE
Ketones, ur: NEGATIVE mg/dL
Nitrite: NEGATIVE
Protein, ur: NEGATIVE mg/dL
Specific Gravity, Urine: 1.022 (ref 1.005–1.030)
pH: 7 (ref 5.0–8.0)

## 2021-10-19 MED ORDER — OXYCODONE HCL 5 MG PO TABS
10.0000 mg | ORAL_TABLET | Freq: Once | ORAL | Status: AC
  Administered 2021-10-19: 10 mg via ORAL
  Filled 2021-10-19: qty 2

## 2021-10-19 MED ORDER — ORPHENADRINE CITRATE 30 MG/ML IJ SOLN
60.0000 mg | Freq: Two times a day (BID) | INTRAMUSCULAR | Status: DC
Start: 2021-10-19 — End: 2021-10-19
  Administered 2021-10-19: 60 mg via INTRAMUSCULAR
  Filled 2021-10-19: qty 2

## 2021-10-19 NOTE — Discharge Instructions (Signed)
Call [facility] for the results of your CT scan.  Your urine today is clear.  Continue with your routine pain medication as prescribed by your pain doctor.  An injection of Norflex was given to you while in the ED.  These 2 medications together could cause drowsiness, be aware that this could increase your risk for falling.  Moist heat or ice to your back as needed for discomfort.  Call [facility] if any continued problems.

## 2021-10-19 NOTE — ED Provider Notes (Signed)
[facility] Provider Note    Event Date/Time   First MD Initiated Contact with Patient 10/19/21 [phone]     (approximate)   History   Back Pain   HPI  Patty Leon is a 61 y.o. female   presented to the ED with complaint of right sided back pain that started last week.  Patient states that she was seen at [facility] yesterday and was told that she may likely have a kidney stone.  Patient denies any nausea, vomiting or diarrhea.  No fever.  No previous history of UTIs or kidney stones.  Patient has a history of hypertension, COPD, diabetes, sleep apnea, GERD.      Physical Exam   Triage Vital Signs: ED Triage Vitals  Enc Vitals Group     BP 10/19/21 0739 138/64     Pulse Rate 10/19/21 0739 96     Resp 10/19/21 0739 18     Temp 10/19/21 0739 98 F (36.7 C)     Temp Source 10/19/21 0739 Oral     SpO2 10/19/21 0739 97 %     Weight 10/19/21 0740 274 lb 14.6 oz (124.7 kg)     Height 10/19/21 0740 '5\' 2"'$  (1.575 m)     Head Circumference --      Peak Flow --      Pain Score 10/19/21 0740 10     Pain Loc --      Pain Edu? --      Excl. in [place]? --     Most recent vital signs: Vitals:   10/19/21 0739  BP: 138/64  Pulse: 96  Resp: 18  Temp: 98 F (36.7 C)  SpO2: 97%     General: Awake, no distress.  CV:  Good peripheral perfusion.  Heart regular rate and rhythm. Resp:  Normal effort.  Clear bilaterally. Abd:  No distention.   Other:  Tenderness is noted on palpation of the paravertebral muscles lumbar spine to the right.  Patient seems to be having some difficulty with range of motion and appears to be uncomfortable more inclined to be consistent with a muscle spasm.  Patient is able to ambulate slowly with mild assistance.   ED Results / Procedures / Treatments   Labs (all labs ordered are listed, but only abnormal results are displayed) Labs Reviewed  URINALYSIS, ROUTINE W REFLEX MICROSCOPIC - Abnormal; Notable for the following  components:      Result Value   Color, Urine STRAW (*)    APPearance CLEAR (*)    Glucose, UA >=500 (*)    Leukocytes,Ua MODERATE (*)    All other components within normal limits      PROCEDURES:  Critical Care performed:   Procedures   MEDICATIONS ORDERED IN ED: Medications  oxyCODONE (Oxy IR/ROXICODONE) immediate release tablet 10 mg (10 mg Oral Given 10/19/21 0851)     IMPRESSION / MDM / ASSESSMENT AND PLAN / ED COURSE  I reviewed the triage vital signs and the nursing notes.   Differential diagnosis includes, but is not limited to, urinary tract infection, urolithiasis, muscle skeletal pain.  61 year old female presents to the ED with complaint of right-sided back pain and has been seen at her PCPs office Monday where she was told that she may have a kidney stone.  Patient states that she had a CT scan done at the office and has not heard the results of this.  She was made aware that we are unable to see  anything pertaining to her visit from [place] in epic.  She was reassured that her urine does not appear to be consistent with a kidney stone.  Patient did call the office while waiting for the results of her urinalysis and nursing staff is supposed to be getting for the results of her CT scan.  Currently patient is being seen by pain management and chronically takes oxycodone IR 5 mg which is not helping with her pain.  She was given oxycodone IR 10 mg an injection of Norflex while in the ED.  Patient was discharged in improved condition, she is to follow-up with her PCP.      Patient's presentation is most consistent with acute complicated illness / injury requiring diagnostic workup.  FINAL CLINICAL IMPRESSION(S) / ED DIAGNOSES   Final diagnoses:  Right lumbar pain     Rx / DC Orders   ED Discharge Orders     None        Note:  This document was prepared using Dragon voice recognition software and may include unintentional dictation errors.    Johnn Hai, PA-C 10/19/21 1556    Naaman Plummer, MD 10/20/21 [phone]

## 2021-10-19 NOTE — ED Triage Notes (Signed)
Pt here with right side back pain that started last week. Pt went for a CT scan but has not heard anything. Pt denies N/V/D.

## 2021-10-19 NOTE — ED Notes (Signed)
See triage note  presents with right sided lower back pain  states she has chronic back but this pain is worse  Pain increases with movement but does not radiate into legs

## 2021-12-20 ENCOUNTER — Other Ambulatory Visit: Payer: Self-pay | Admitting: Nurse Practitioner

## 2021-12-20 DIAGNOSIS — Z1231 Encounter for screening mammogram for malignant neoplasm of breast: Secondary | ICD-10-CM

## 2022-01-19 ENCOUNTER — Ambulatory Visit
Admission: RE | Admit: 2022-01-19 | Discharge: 2022-01-19 | Disposition: A | Discharge status: Home / Self Care | Payer: Medicaid Other | Source: Ambulatory Visit | Attending: Nurse Practitioner | Admitting: Nurse Practitioner

## 2022-01-19 DIAGNOSIS — Z1231 Encounter for screening mammogram for malignant neoplasm of breast: Secondary | ICD-10-CM | POA: Diagnosis present

## 2022-04-12 ENCOUNTER — Other Ambulatory Visit: Payer: Self-pay | Admitting: Internal Medicine

## 2022-04-12 DIAGNOSIS — J189 Pneumonia, unspecified organism: Secondary | ICD-10-CM

## 2022-04-13 ENCOUNTER — Ambulatory Visit (INDEPENDENT_AMBULATORY_CARE_PROVIDER_SITE_OTHER): Payer: Medicaid Other

## 2022-04-13 DIAGNOSIS — J189 Pneumonia, unspecified organism: Secondary | ICD-10-CM | POA: Diagnosis not present

## 2022-05-01 ENCOUNTER — Telehealth: Payer: Self-pay

## 2022-05-01 NOTE — Telephone Encounter (Signed)
Pt called and left vm requesting a call back regarding xray results from a few weeks ago please advise

## 2022-05-02 NOTE — Telephone Encounter (Signed)
Patient infomed

## 2022-05-09 ENCOUNTER — Other Ambulatory Visit: Payer: Self-pay | Admitting: Nurse Practitioner

## 2022-05-09 DIAGNOSIS — E119 Type 2 diabetes mellitus without complications: Secondary | ICD-10-CM

## 2022-06-13 ENCOUNTER — Other Ambulatory Visit: Payer: Self-pay | Admitting: Nurse Practitioner

## 2022-06-13 DIAGNOSIS — K219 Gastro-esophageal reflux disease without esophagitis: Secondary | ICD-10-CM

## 2022-06-30 ENCOUNTER — Other Ambulatory Visit: Payer: Self-pay | Admitting: Nurse Practitioner

## 2022-06-30 ENCOUNTER — Other Ambulatory Visit: Payer: Medicaid Other

## 2022-06-30 DIAGNOSIS — Z1329 Encounter for screening for other suspected endocrine disorder: Secondary | ICD-10-CM

## 2022-06-30 DIAGNOSIS — Z13220 Encounter for screening for lipoid disorders: Secondary | ICD-10-CM

## 2022-06-30 DIAGNOSIS — I10 Essential (primary) hypertension: Secondary | ICD-10-CM

## 2022-06-30 DIAGNOSIS — E1165 Type 2 diabetes mellitus with hyperglycemia: Secondary | ICD-10-CM

## 2022-07-01 LAB — HEMOGLOBIN A1C
Est. average glucose Bld gHb Est-mCnc: 171 mg/dL
Hgb A1c MFr Bld: 7.6 % — ABNORMAL HIGH (ref 4.8–5.6)

## 2022-07-01 LAB — CMP14+EGFR
ALT: 22 IU/L (ref 0–32)
AST: 14 IU/L (ref 0–40)
Albumin/Globulin Ratio: 1.6 (ref 1.2–2.2)
Albumin: 4.2 g/dL (ref 3.8–4.9)
Alkaline Phosphatase: 111 IU/L (ref 44–121)
BUN/Creatinine Ratio: 32 — ABNORMAL HIGH (ref 12–28)
BUN: 14 mg/dL (ref 8–27)
Bilirubin Total: <0.2 mg/dL (ref 0.0–1.2)
CO2: 29 mmol/L (ref 20–29)
Calcium: 9.8 mg/dL (ref 8.7–10.3)
Chloride: 95 mmol/L — ABNORMAL LOW (ref 96–106)
Creatinine, Ser: 0.44 mg/dL — ABNORMAL LOW (ref 0.57–1.00)
Globulin, Total: 2.7 g/dL (ref 1.5–4.5)
Glucose: 152 mg/dL — ABNORMAL HIGH (ref 70–99)
Potassium: 3.8 mmol/L (ref 3.5–5.2)
Sodium: 140 mmol/L (ref 134–144)
Total Protein: 6.9 g/dL (ref 6.0–8.5)
eGFR: 111 mL/min/{1.73_m2} (ref >59)

## 2022-07-01 LAB — LIPID PANEL
Chol/HDL Ratio: 2.2 ratio (ref 0.0–4.4)
Cholesterol, Total: 178 mg/dL (ref 100–199)
HDL: 82 mg/dL (ref >39)
LDL Chol Calc (NIH): 78 mg/dL (ref 0–99)
Triglycerides: 100 mg/dL (ref 0–149)
VLDL Cholesterol Cal: 18 mg/dL (ref 5–40)

## 2022-07-01 LAB — TSH: TSH: 0.664 u[IU]/mL (ref 0.450–4.500)

## 2022-07-03 ENCOUNTER — Encounter: Payer: Self-pay | Admitting: Nurse Practitioner

## 2022-07-03 ENCOUNTER — Ambulatory Visit: Payer: Medicaid Other | Admitting: Nurse Practitioner

## 2022-07-03 VITALS — BP 130/70 | HR 111 | Ht 62.0 in | Wt 302.2 lb

## 2022-07-03 DIAGNOSIS — E1165 Type 2 diabetes mellitus with hyperglycemia: Secondary | ICD-10-CM

## 2022-07-03 DIAGNOSIS — F411 Generalized anxiety disorder: Secondary | ICD-10-CM | POA: Diagnosis not present

## 2022-07-03 DIAGNOSIS — J329 Chronic sinusitis, unspecified: Secondary | ICD-10-CM

## 2022-07-03 DIAGNOSIS — K219 Gastro-esophageal reflux disease without esophagitis: Secondary | ICD-10-CM

## 2022-07-03 DIAGNOSIS — M6740 Ganglion, unspecified site: Secondary | ICD-10-CM

## 2022-07-03 DIAGNOSIS — I10 Essential (primary) hypertension: Secondary | ICD-10-CM | POA: Diagnosis not present

## 2022-07-03 DIAGNOSIS — J301 Allergic rhinitis due to pollen: Secondary | ICD-10-CM

## 2022-07-03 MED ORDER — AMOXICILLIN-POT CLAVULANATE 875-125 MG PO TABS: 1.0000 | ORAL_TABLET | Freq: Two times a day (BID) | ORAL | 0 refills | Status: DC

## 2022-07-03 MED ORDER — FLUTICASONE PROPIONATE 50 MCG/ACT NA SUSP: 2.0000 | Freq: Every day | NASAL | Status: DC

## 2022-07-03 NOTE — Progress Notes (Signed)
Established Patient Office Visit  Subjective:  Patient ID: Patty Leon, female    DOB: [birth year]  Age: 62 y.o. MRN: [id number]  Chief Complaint  Patient presents with   Follow-up    3 month lab results    Follow up.  Needs allergy meds.  A1c is at 7.6%.  eGFR is 111.  Right wrist ganglion.      No other concerns at this time.   Past Medical History:  Diagnosis Date   Acute exacerbation of CHF (congestive heart failure) 10/17/2020   Anxiety    Arthritis    COPD (chronic obstructive pulmonary disease)    Diabetes mellitus without complication    Diverticulosis    Fluid retention    GERD (gastroesophageal reflux disease)    History of blood clots    Hypercholesteremia    Hypertension    Obesity    Panic attacks    Shortness of breath dyspnea    Sleep apnea    Use C-PAP with oxygen    Past Surgical History:  Procedure Laterality Date   BILATERAL CARPAL TUNNEL RELEASE     COLONOSCOPY     COLONOSCOPY N/A 01/22/2015   Procedure: COLONOSCOPY;  Surgeon: Elnita Maxwell, MD;  Location: [facility] ENDOSCOPY;  Service: Endoscopy;  Laterality: N/A;   COLONOSCOPY N/A 08/24/2021   Procedure: COLONOSCOPY;  Surgeon: Toledo, Boykin Nearing, MD;  Location: [facility] ENDOSCOPY;  Service: Gastroenterology;  Laterality: N/A;  DM   COLONOSCOPY WITH PROPOFOL N/A 07/12/2016   Procedure: COLONOSCOPY WITH PROPOFOL;  Surgeon: Scot Jun, MD;  Location: [facility] ENDOSCOPY;  Service: Endoscopy;  Laterality: N/A;   COLONOSCOPY WITH PROPOFOL N/A 04/26/2018   Procedure: COLONOSCOPY WITH PROPOFOL;  Surgeon: Scot Jun, MD;  Location: [facility] ENDOSCOPY;  Service: Endoscopy;  Laterality: N/A;   ESOPHAGOGASTRODUODENOSCOPY (EGD) WITH PROPOFOL N/A 04/26/2018   Procedure: ESOPHAGOGASTRODUODENOSCOPY (EGD) WITH PROPOFOL;  Surgeon: Scot Jun, MD;  Location: [facility] ENDOSCOPY;  Service: Endoscopy;  Laterality: N/A;   JOINT REPLACEMENT Left 09/2015   KNEE ARTHROSCOPY Left    KNEE CLOSED REDUCTION Left  11/04/2015   Procedure: CLOSED MANIPULATION KNEE;  Surgeon: Kennedy Bucker, MD;  Location: [facility] ORS;  Service: Orthopedics;  Laterality: Left;   KNEE CLOSED REDUCTION Left 04/04/2016   Procedure: CLOSED MANIPULATION KNEE;  Surgeon: Kennedy Bucker, MD;  Location: [facility] ORS;  Service: Orthopedics;  Laterality: Left;   SHOULDER ARTHROSCOPY W/ ROTATOR CUFF REPAIR Right    TOTAL KNEE ARTHROPLASTY Left 10/07/2015   Procedure: TOTAL KNEE ARTHROPLASTY;  Surgeon: Kennedy Bucker, MD;  Location: [facility] ORS;  Service: Orthopedics;  Laterality: Left;   TOTAL KNEE REVISION Left 02/08/2016   Procedure: TOTAL KNEE REVISION;  Surgeon: Kennedy Bucker, MD;  Location: [facility] ORS;  Service: Orthopedics;  Laterality: Left;   TUBAL LIGATION      Social History   Socioeconomic History   Marital status: Married    Spouse name: Not on file   Number of children: Not on file   Years of education: Not on file   Highest education level: Not on file  Occupational History   Not on file  Tobacco Use   Smoking status: Every Day    Packs/day: 0.50    Years: 30.00    Additional pack years: 0.00    Total pack years: 15.00    Types: Cigarettes   Smokeless tobacco: Never  Vaping Use   Vaping Use: Never used  Substance and Sexual Activity   Alcohol use: Not Currently   Drug use:  Not Currently    Types: Oxycodone    Comment: as prescribed by MD   Sexual activity: Yes  Other Topics Concern   Not on file  Social History Narrative   Not on file   Social Determinants of Health   Financial Resource Strain: Not on file  Food Insecurity: Not on file  Transportation Needs: Not on file  Physical Activity: Not on file  Stress: Not on file  Social Connections: Not on file  Intimate Partner Violence: Not on file    Family History  Problem Relation Age of Onset   Lung cancer Mother    Ovarian cancer Paternal Grandmother    Breast cancer Neg Hx     Allergies  Allergen Reactions   Celebrex [Celecoxib] Swelling   Dilaudid  [Hydromorphone] Itching   Fentanyl Other (See Comments)    Hallucinations.     Review of Systems  Constitutional: Negative.   HENT: Negative.    Eyes: Negative.   Respiratory: Negative.    Cardiovascular: Negative.   Gastrointestinal: Negative.   Genitourinary: Negative.   Musculoskeletal:  Positive for joint pain and myalgias.  Skin: Negative.   Neurological: Negative.   Endo/Heme/Allergies: Negative.   Psychiatric/Behavioral:  The patient is nervous/anxious.        Objective:   BP 130/70   Pulse (!) 111   Ht 5\' 2"  (1.575 m)   Wt (!) 302 lb 3.2 oz (137.1 kg)   LMP 04/24/2013 Comment: LMP >74YRS AGO  SpO2 91%   BMI 55.27 kg/m   Vitals:   07/03/22 0848  BP: 130/70  Pulse: (!) 111  Height: 5\' 2"  (1.575 m)  Weight: (!) 302 lb 3.2 oz (137.1 kg)  SpO2: 91%  BMI (Calculated): 55.26    Physical Exam Vitals reviewed.  Constitutional:      Appearance: Normal appearance.  HENT:     Head: Normocephalic.     Nose: Nose normal.     Mouth/Throat:     Mouth: Mucous membranes are moist.  Eyes:     Pupils: Pupils are equal, round, and reactive to light.  Cardiovascular:     Rate and Rhythm: Normal rate and regular rhythm.  Pulmonary:     Effort: Pulmonary effort is normal.     Breath sounds: Normal breath sounds.  Abdominal:     General: Bowel sounds are normal.     Palpations: Abdomen is soft.  Musculoskeletal:        General: Tenderness present.     Cervical back: Normal range of motion and neck supple.  Skin:    General: Skin is warm and dry.  Neurological:     Mental Status: She is alert and oriented to person, place, and time.  Psychiatric:        Mood and Affect: Mood normal.        Behavior: Behavior normal.      No results found for any visits on 07/03/22.  Recent Results (from the past 2160 hour(s))  Hemoglobin A1c     Status: Abnormal   Collection Time: 06/30/22  8:53 AM  Result Value Ref Range   Hgb A1c MFr Bld 7.6 (H) 4.8 - 5.6 %     Comment:          Prediabetes: 5.7 - 6.4          Diabetes: >6.4          Glycemic control for adults with diabetes: <7.0    Est. average glucose Bld gHb Est-mCnc 171  mg/dL  TSH     Status: None   Collection Time: 06/30/22  8:53 AM  Result Value Ref Range   TSH 0.664 0.450 - 4.500 uIU/mL  CMP14+EGFR     Status: Abnormal   Collection Time: 06/30/22  8:53 AM  Result Value Ref Range   Glucose 152 (H) 70 - 99 mg/dL   BUN 14 8 - 27 mg/dL   Creatinine, Ser 1.61 (L) 0.57 - 1.00 mg/dL   eGFR 096 >04 VW/UJW/1.19   BUN/Creatinine Ratio 32 (H) 12 - 28   Sodium 140 134 - 144 mmol/L   Potassium 3.8 3.5 - 5.2 mmol/L   Chloride 95 (L) 96 - 106 mmol/L   CO2 29 20 - 29 mmol/L   Calcium 9.8 8.7 - 10.3 mg/dL   Total Protein 6.9 6.0 - 8.5 g/dL   Albumin 4.2 3.8 - 4.9 g/dL   Globulin, Total 2.7 1.5 - 4.5 g/dL   Albumin/Globulin Ratio 1.6 1.2 - 2.2   Bilirubin Total <0.2 0.0 - 1.2 mg/dL   Alkaline Phosphatase 111 44 - 121 IU/L   AST 14 0 - 40 IU/L   ALT 22 0 - 32 IU/L  Lipid panel     Status: None   Collection Time: 06/30/22  8:53 AM  Result Value Ref Range   Cholesterol, Total 178 100 - 199 mg/dL   Triglycerides 147 0 - 149 mg/dL   HDL 82 >82 mg/dL   VLDL Cholesterol Cal 18 5 - 40 mg/dL   LDL Chol Calc (NIH) 78 0 - 99 mg/dL   Chol/HDL Ratio 2.2 0.0 - 4.4 ratio    Comment:                                   T. Chol/HDL Ratio                                             Men  Women                               1/2 Avg.Risk  3.4    3.3                                   Avg.Risk  5.0    4.4                                2X Avg.Risk  9.6    7.1                                3X Avg.Risk 23.4   11.0       Assessment & Plan:   Problem List Items Addressed This Visit       Cardiovascular and Mediastinum   Benign essential hypertension - Primary     Digestive   GERD (gastroesophageal reflux disease)     Endocrine   Uncontrolled type 2 diabetes mellitus with hyperglycemia, without  long-term current use of insulin     Other   Anxiety disorder   Other Visit Diagnoses  Ganglion cyst       Relevant Orders   Ambulatory referral to Orthopedic Surgery   Allergic rhinitis due to pollen, unspecified seasonality           Return in about 5 months (around 12/03/2022).   Total time spent: 35 minutes  Orson Eva, NP  07/03/2022

## 2022-07-03 NOTE — Patient Instructions (Addendum)
1) Cont zyrtec and start fluticasone 2) Follow up appt in 5 months, fasting labs prior 3) ortho referral for right ganglion cyst

## 2022-07-31 ENCOUNTER — Other Ambulatory Visit: Payer: Self-pay | Admitting: Nurse Practitioner

## 2022-07-31 DIAGNOSIS — J189 Pneumonia, unspecified organism: Secondary | ICD-10-CM

## 2022-08-01 ENCOUNTER — Other Ambulatory Visit: Payer: Self-pay | Admitting: Internal Medicine

## 2022-08-01 DIAGNOSIS — J301 Allergic rhinitis due to pollen: Secondary | ICD-10-CM

## 2022-08-06 ENCOUNTER — Other Ambulatory Visit: Payer: Self-pay | Admitting: Nurse Practitioner

## 2022-08-06 DIAGNOSIS — E119 Type 2 diabetes mellitus without complications: Secondary | ICD-10-CM

## 2022-09-26 ENCOUNTER — Other Ambulatory Visit: Payer: Self-pay | Admitting: Nurse Practitioner

## 2022-09-26 ENCOUNTER — Ambulatory Visit: Payer: Medicaid Other | Admitting: Cardiology

## 2022-09-26 ENCOUNTER — Encounter: Payer: Self-pay | Admitting: Cardiology

## 2022-09-26 VITALS — BP 132/62 | HR 105 | Ht 62.0 in | Wt 300.6 lb

## 2022-09-26 DIAGNOSIS — N898 Other specified noninflammatory disorders of vagina: Secondary | ICD-10-CM | POA: Diagnosis not present

## 2022-09-26 DIAGNOSIS — E1165 Type 2 diabetes mellitus with hyperglycemia: Secondary | ICD-10-CM

## 2022-09-26 DIAGNOSIS — M25562 Pain in left knee: Secondary | ICD-10-CM | POA: Insufficient documentation

## 2022-09-26 DIAGNOSIS — G8929 Other chronic pain: Secondary | ICD-10-CM | POA: Insufficient documentation

## 2022-09-26 DIAGNOSIS — E1142 Type 2 diabetes mellitus with diabetic polyneuropathy: Secondary | ICD-10-CM

## 2022-09-26 DIAGNOSIS — M25561 Pain in right knee: Secondary | ICD-10-CM

## 2022-09-26 LAB — POCT CBG (FASTING - GLUCOSE)-MANUAL ENTRY: Glucose Fasting, POC: 316 mg/dL — AB (ref 70–99)

## 2022-09-26 MED ORDER — SEMAGLUTIDE(0.25 OR 0.5MG/DOS) 2 MG/3ML ~~LOC~~ SOPN: 0.2500 mg | PEN_INJECTOR | SUBCUTANEOUS | 3 refills | Status: DC

## 2022-09-26 MED ORDER — FLUCONAZOLE 150 MG PO TABS: 150.0000 mg | ORAL_TABLET | Freq: Every day | ORAL | 2 refills | Status: AC

## 2022-09-26 NOTE — Progress Notes (Signed)
Established Patient Office Visit  Subjective:  Patient ID: Patty Leon, female    DOB: 1960-12-09  Age: 62 y.o. MRN: [id number]  Chief Complaint  Patient presents with   Knee Pain    Right knee pain   Vaginal Itching    Patient in office complaining of right knee pain and vaginal itching. Patient reports knee pain has been ongoing for years, needs a knee replacement. Takes oxycodone for her back pain, helps with her knee pain some. Patient wanting to loose weight then will follow up with orthopaedist. Patient complaining of vaginal itching. Patient has a history of vaginal itching from [place]. Patient requesting to be started on Ozempic as dicussed in the past. Will send in a prescription for diflucan, continue using Nystatin cream. Ozempic sent to the pharmacy. Continue strict diet control.   Knee Pain  The incident occurred more than 1 week ago. The pain is present in the right knee. The quality of the pain is described as aching. The pain has been Intermittent since onset. She reports no foreign bodies present. The symptoms are aggravated by movement.  Vaginal Itching The patient's primary symptoms include genital itching. This is a recurrent problem. The current episode started in the past 7 days. The problem has been unchanged. The patient is experiencing no pain. She is not pregnant. Associated symptoms include joint pain. Pertinent negatives include no abdominal pain, constipation, diarrhea or headaches. Nothing aggravates the symptoms. Treatments tried: nystatin cream. The treatment provided no relief.    No other concerns at this time.   Past Medical History:  Diagnosis Date   Acute exacerbation of CHF (congestive heart failure) (HCC) 10/17/2020   Anxiety    Arthritis    COPD (chronic obstructive pulmonary disease) (HCC)    Diabetes mellitus without complication (HCC)    Diverticulosis    Fluid retention    GERD (gastroesophageal reflux disease)    History of blood  clots    Hypercholesteremia    Hypertension    Obesity    Panic attacks    Shortness of breath dyspnea    Sleep apnea    Use C-PAP with oxygen    Past Surgical History:  Procedure Laterality Date   BILATERAL CARPAL TUNNEL RELEASE     COLONOSCOPY     COLONOSCOPY N/A 01/22/2015   Procedure: COLONOSCOPY;  Surgeon: Elnita Maxwell, MD;  Location: [facility] ENDOSCOPY;  Service: Endoscopy;  Laterality: N/A;   COLONOSCOPY N/A 08/24/2021   Procedure: COLONOSCOPY;  Surgeon: Toledo, Boykin Nearing, MD;  Location: [facility] ENDOSCOPY;  Service: Gastroenterology;  Laterality: N/A;  DM   COLONOSCOPY WITH PROPOFOL N/A 07/12/2016   Procedure: COLONOSCOPY WITH PROPOFOL;  Surgeon: Scot Jun, MD;  Location: [facility] ENDOSCOPY;  Service: Endoscopy;  Laterality: N/A;   COLONOSCOPY WITH PROPOFOL N/A 04/26/2018   Procedure: COLONOSCOPY WITH PROPOFOL;  Surgeon: Scot Jun, MD;  Location: [facility] ENDOSCOPY;  Service: Endoscopy;  Laterality: N/A;   ESOPHAGOGASTRODUODENOSCOPY (EGD) WITH PROPOFOL N/A 04/26/2018   Procedure: ESOPHAGOGASTRODUODENOSCOPY (EGD) WITH PROPOFOL;  Surgeon: Scot Jun, MD;  Location: [facility] ENDOSCOPY;  Service: Endoscopy;  Laterality: N/A;   JOINT REPLACEMENT Left 09/2015   KNEE ARTHROSCOPY Left    KNEE CLOSED REDUCTION Left 11/04/2015   Procedure: CLOSED MANIPULATION KNEE;  Surgeon: Kennedy Bucker, MD;  Location: [facility] ORS;  Service: Orthopedics;  Laterality: Left;   KNEE CLOSED REDUCTION Left 04/04/2016   Procedure: CLOSED MANIPULATION KNEE;  Surgeon: Kennedy Bucker, MD;  Location: [facility] ORS;  Service: Orthopedics;  Laterality: Left;   SHOULDER ARTHROSCOPY W/ ROTATOR CUFF REPAIR Right    TOTAL KNEE ARTHROPLASTY Left 10/07/2015   Procedure: TOTAL KNEE ARTHROPLASTY;  Surgeon: Kennedy Bucker, MD;  Location: [facility] ORS;  Service: Orthopedics;  Laterality: Left;   TOTAL KNEE REVISION Left 02/08/2016   Procedure: TOTAL KNEE REVISION;  Surgeon: Kennedy Bucker, MD;  Location: [facility] ORS;  Service: Orthopedics;   Laterality: Left;   TUBAL LIGATION      Social History   Socioeconomic History   Marital status: Married    Spouse name: Not on file   Number of children: Not on file   Years of education: Not on file   Highest education level: Not on file  Occupational History   Not on file  Tobacco Use   Smoking status: Every Day    Current packs/day: 0.50    Average packs/day: 0.5 packs/day for 30.0 years (15.0 ttl pk-yrs)    Types: Cigarettes   Smokeless tobacco: Never  Vaping Use   Vaping status: Never Used  Substance and Sexual Activity   Alcohol use: Not Currently   Drug use: Not Currently    Types: Oxycodone    Comment: as prescribed by MD   Sexual activity: Yes  Other Topics Concern   Not on file  Social History Narrative   Not on file   Social Determinants of Health   Financial Resource Strain: Not on file  Food Insecurity: Not on file  Transportation Needs: Not on file  Physical Activity: Not on file  Stress: Not on file  Social Connections: Unknown (07/26/2021)   Received from [facility]   Social Network    Social Network: Not on file  Intimate Partner Violence: Unknown (06/16/2021)   Received from [facility]   HITS    Physically Hurt: Not on file    Insult or Talk Down To: Not on file    Threaten Physical Harm: Not on file    Scream or Curse: Not on file    Family History  Problem Relation Age of Onset   Lung cancer Mother    Ovarian cancer Paternal Grandmother    Breast cancer Neg Hx     Allergies  Allergen Reactions   Celebrex [Celecoxib] Swelling   Dilaudid [Hydromorphone] Itching   Fentanyl Other (See Comments)    Hallucinations.     Review of Systems  Constitutional: Negative.   HENT: Negative.    Eyes: Negative.   Respiratory: Negative.  Negative for shortness of breath.   Cardiovascular: Negative.  Negative for chest pain.  Gastrointestinal: Negative.  Negative for abdominal pain, constipation and diarrhea.  Genitourinary: Negative.    Musculoskeletal:  Positive for joint pain. Negative for myalgias.       Right knee pain  Skin: Negative.   Neurological: Negative.  Negative for dizziness and headaches.  Endo/Heme/Allergies: Negative.   All other systems reviewed and are negative.      Objective:   BP 132/62   Pulse (!) 105   Ht 5\' 2"  (1.575 m)   Wt (!) 300 lb 9.6 oz (136.4 kg)   LMP 04/24/2013 Comment: LMP >21YRS AGO  SpO2 95%   BMI 54.98 kg/m   Vitals:   09/26/22 1051  BP: 132/62  Pulse: (!) 105  Height: 5\' 2"  (1.575 m)  Weight: (!) 300 lb 9.6 oz (136.4 kg)  SpO2: 95%  BMI (Calculated): 54.97    Physical Exam Vitals and nursing note reviewed.  Constitutional:      Appearance: Normal  appearance. She is normal weight.  HENT:     Head: Normocephalic and atraumatic.     Nose: Nose normal.     Mouth/Throat:     Mouth: Mucous membranes are moist.  Eyes:     Extraocular Movements: Extraocular movements intact.     Conjunctiva/sclera: Conjunctivae normal.     Pupils: Pupils are equal, round, and reactive to light.  Cardiovascular:     Rate and Rhythm: Normal rate and regular rhythm.     Pulses: Normal pulses.     Heart sounds: Normal heart sounds.  Pulmonary:     Effort: Pulmonary effort is normal.     Breath sounds: Normal breath sounds.  Abdominal:     General: Abdomen is flat. Bowel sounds are normal.     Palpations: Abdomen is soft.  Musculoskeletal:        General: Normal range of motion.     Cervical back: Normal range of motion.  Skin:    General: Skin is warm and dry.  Neurological:     General: No focal deficit present.     Mental Status: She is alert and oriented to person, place, and time.  Psychiatric:        Mood and Affect: Mood normal.        Behavior: Behavior normal.        Thought Content: Thought content normal.        Judgment: Judgment normal.      Results for orders placed or performed in visit on 09/26/22  POCT CBG (Fasting - Glucose)  Result Value Ref Range    Glucose Fasting, POC 316 (A) 70 - 99 mg/dL    Recent Results (from the past 2160 hour(s))  Hemoglobin A1c     Status: Abnormal   Collection Time: 06/30/22  8:53 AM  Result Value Ref Range   Hgb A1c MFr Bld 7.6 (H) 4.8 - 5.6 %    Comment:          Prediabetes: 5.7 - 6.4          Diabetes: >6.4          Glycemic control for adults with diabetes: <7.0    Est. average glucose Bld gHb Est-mCnc 171 mg/dL  TSH     Status: None   Collection Time: 06/30/22  8:53 AM  Result Value Ref Range   TSH 0.664 0.450 - 4.500 uIU/mL  CMP14+EGFR     Status: Abnormal   Collection Time: 06/30/22  8:53 AM  Result Value Ref Range   Glucose 152 (H) 70 - 99 mg/dL   BUN 14 8 - 27 mg/dL   Creatinine, Ser 9.62 (L) 0.57 - 1.00 mg/dL   eGFR 952 >84 XL/KGM/0.10   BUN/Creatinine Ratio 32 (H) 12 - 28   Sodium 140 134 - 144 mmol/L   Potassium 3.8 3.5 - 5.2 mmol/L   Chloride 95 (L) 96 - 106 mmol/L   CO2 29 20 - 29 mmol/L   Calcium 9.8 8.7 - 10.3 mg/dL   Total Protein 6.9 6.0 - 8.5 g/dL   Albumin 4.2 3.8 - 4.9 g/dL   Globulin, Total 2.7 1.5 - 4.5 g/dL   Albumin/Globulin Ratio 1.6 1.2 - 2.2   Bilirubin Total <0.2 0.0 - 1.2 mg/dL   Alkaline Phosphatase 111 44 - 121 IU/L   AST 14 0 - 40 IU/L   ALT 22 0 - 32 IU/L  Lipid panel     Status: None   Collection Time: 06/30/22  8:53 AM  Result Value Ref Range   Cholesterol, Total 178 100 - 199 mg/dL   Triglycerides 161 0 - 149 mg/dL   HDL 82 >09 mg/dL   VLDL Cholesterol Cal 18 5 - 40 mg/dL   LDL Chol Calc (NIH) 78 0 - 99 mg/dL   Chol/HDL Ratio 2.2 0.0 - 4.4 ratio    Comment:                                   T. Chol/HDL Ratio                                             Men  Women                               1/2 Avg.Risk  3.4    3.3                                   Avg.Risk  5.0    4.4                                2X Avg.Risk  9.6    7.1                                3X Avg.Risk 23.4   11.0   POCT CBG (Fasting - Glucose)     Status: Abnormal    Collection Time: 09/26/22 10:57 AM  Result Value Ref Range   Glucose Fasting, POC 316 (A) 70 - 99 mg/dL      Assessment & Plan:  Knee pain, work on weight loss for eventual knee replacement. Diflucan and Nystatin for vaginal itching.  Ozempic sent to the pharmacy. Will likely require a PA.  Problem List Items Addressed This Visit       Endocrine   Uncontrolled type 2 diabetes mellitus with hyperglycemia, without long-term current use of insulin (HCC) - Primary   Relevant Medications   Semaglutide,0.25 or 0.5MG /DOS, 2 MG/3ML SOPN   Other Relevant Orders   POCT CBG (Fasting - Glucose) (Completed)     Genitourinary   Vaginal itching     Other   Chronic pain of right knee    Return in about 4 weeks (around 10/24/2022).   Total time spent: 25 minutes  [employer], NP  09/26/2022   This document may have been prepared by Dragon Voice Recognition software and as such may include unintentional dictation errors.

## 2022-10-24 ENCOUNTER — Other Ambulatory Visit: Payer: Self-pay | Admitting: Cardiology

## 2022-10-24 ENCOUNTER — Encounter: Payer: Self-pay | Admitting: Cardiology

## 2022-10-24 ENCOUNTER — Ambulatory Visit: Payer: Medicaid Other | Admitting: Cardiology

## 2022-10-24 VITALS — BP 116/68 | HR 87 | Ht 62.0 in | Wt 291.8 lb

## 2022-10-24 DIAGNOSIS — Z6841 Body Mass Index (BMI) 40.0 and over, adult: Secondary | ICD-10-CM

## 2022-10-24 DIAGNOSIS — E1165 Type 2 diabetes mellitus with hyperglycemia: Secondary | ICD-10-CM

## 2022-10-24 DIAGNOSIS — I10 Essential (primary) hypertension: Secondary | ICD-10-CM

## 2022-10-24 MED ORDER — ZINC OXIDE 13 % EX CREA: TOPICAL_OINTMENT | Freq: Two times a day (BID) | CUTANEOUS | 1 refills | Status: DC | PRN

## 2022-10-24 MED ORDER — SEMAGLUTIDE(0.25 OR 0.5MG/DOS) 2 MG/3ML ~~LOC~~ SOPN: 0.5000 mg | PEN_INJECTOR | SUBCUTANEOUS | 3 refills | Status: DC

## 2022-10-24 MED ORDER — NYSTATIN-TRIAMCINOLONE 100000-0.1 UNIT/GM-% EX OINT: 1.0000 | TOPICAL_OINTMENT | Freq: Two times a day (BID) | CUTANEOUS | 0 refills | Status: DC

## 2022-10-24 MED ORDER — LINACLOTIDE 290 MCG PO CAPS: 290.0000 ug | ORAL_CAPSULE | Freq: Every day | ORAL | 1 refills | Status: DC

## 2022-10-24 MED ORDER — SEMAGLUTIDE(0.25 OR 0.5MG/DOS) 2 MG/3ML ~~LOC~~ SOPN: 0.2500 mg | PEN_INJECTOR | SUBCUTANEOUS | 3 refills | Status: DC

## 2022-10-24 NOTE — Progress Notes (Signed)
Established Patient Office Visit  Subjective:  Patient ID: Patty Leon, female    DOB: 1960-12-23  Age: 62 y.o. MRN: [id number]  Chief Complaint  Patient presents with   Follow-up    4 week follow up    Patient in office for 4 week follow up. Patient doing well. Vaginal itching improved. Continues to have intermittent vaginal itching due to Jardiance.  Taking and tolerating Ozempic. Down 9 lbs. Increase dose to 0.5 mg weekly. Fasting labs prior to next visit in 6 weeks.    No other concerns at this time.   Past Medical History:  Diagnosis Date   Acute exacerbation of CHF (congestive heart failure) (HCC) 10/17/2020   Anxiety    Arthritis    COPD (chronic obstructive pulmonary disease) (HCC)    Diabetes mellitus without complication (HCC)    Diverticulosis    Fluid retention    GERD (gastroesophageal reflux disease)    History of blood clots    Hypercholesteremia    Hypertension    Obesity    Obesity, Class III, BMI 40-49.9 (morbid obesity) (HCC) 07/24/2020   Panic attacks    Shortness of breath dyspnea    Sleep apnea    Use C-PAP with oxygen    Past Surgical History:  Procedure Laterality Date   BILATERAL CARPAL TUNNEL RELEASE     COLONOSCOPY     COLONOSCOPY N/A 01/22/2015   Procedure: COLONOSCOPY;  Surgeon: Elnita Maxwell, MD;  Location: [facility] ENDOSCOPY;  Service: Endoscopy;  Laterality: N/A;   COLONOSCOPY N/A 08/24/2021   Procedure: COLONOSCOPY;  Surgeon: Toledo, Boykin Nearing, MD;  Location: [facility] ENDOSCOPY;  Service: Gastroenterology;  Laterality: N/A;  DM   COLONOSCOPY WITH PROPOFOL N/A 07/12/2016   Procedure: COLONOSCOPY WITH PROPOFOL;  Surgeon: Scot Jun, MD;  Location: [facility] ENDOSCOPY;  Service: Endoscopy;  Laterality: N/A;   COLONOSCOPY WITH PROPOFOL N/A 04/26/2018   Procedure: COLONOSCOPY WITH PROPOFOL;  Surgeon: Scot Jun, MD;  Location: [facility] ENDOSCOPY;  Service: Endoscopy;  Laterality: N/A;   ESOPHAGOGASTRODUODENOSCOPY (EGD) WITH PROPOFOL N/A  04/26/2018   Procedure: ESOPHAGOGASTRODUODENOSCOPY (EGD) WITH PROPOFOL;  Surgeon: Scot Jun, MD;  Location: [facility] ENDOSCOPY;  Service: Endoscopy;  Laterality: N/A;   JOINT REPLACEMENT Left 09/2015   KNEE ARTHROSCOPY Left    KNEE CLOSED REDUCTION Left 11/04/2015   Procedure: CLOSED MANIPULATION KNEE;  Surgeon: Kennedy Bucker, MD;  Location: [facility] ORS;  Service: Orthopedics;  Laterality: Left;   KNEE CLOSED REDUCTION Left 04/04/2016   Procedure: CLOSED MANIPULATION KNEE;  Surgeon: Kennedy Bucker, MD;  Location: [facility] ORS;  Service: Orthopedics;  Laterality: Left;   SHOULDER ARTHROSCOPY W/ ROTATOR CUFF REPAIR Right    TOTAL KNEE ARTHROPLASTY Left 10/07/2015   Procedure: TOTAL KNEE ARTHROPLASTY;  Surgeon: Kennedy Bucker, MD;  Location: [facility] ORS;  Service: Orthopedics;  Laterality: Left;   TOTAL KNEE REVISION Left 02/08/2016   Procedure: TOTAL KNEE REVISION;  Surgeon: Kennedy Bucker, MD;  Location: [facility] ORS;  Service: Orthopedics;  Laterality: Left;   TUBAL LIGATION      Social History   Socioeconomic History   Marital status: Married    Spouse name: Not on file   Number of children: Not on file   Years of education: Not on file   Highest education level: Not on file  Occupational History   Not on file  Tobacco Use   Smoking status: Every Day    Current packs/day: 0.50    Average packs/day: 0.5 packs/day for 30.0 years (15.0 ttl pk-yrs)  Types: Cigarettes   Smokeless tobacco: Never  Vaping Use   Vaping status: Never Used  Substance and Sexual Activity   Alcohol use: Not Currently   Drug use: Not Currently    Types: Oxycodone    Comment: as prescribed by MD   Sexual activity: Yes  Other Topics Concern   Not on file  Social History Narrative   Not on file   Social Determinants of Health   Financial Resource Strain: Low Risk  (10/04/2022)   Received from [facility]   Overall Financial Resource Strain (CARDIA)    Difficulty of Paying Living Expenses: Not hard at  all  Food Insecurity: No Food Insecurity (10/04/2022)   Received from [facility]   Hunger Vital Sign    Worried About Running Out of Food in the Last Year: Never true    Ran Out of Food in the Last Year: Never true  Transportation Needs: No Transportation Needs (10/04/2022)   Received from [facility] - Transportation    In the past 12 months, has lack of transportation kept you from medical appointments or from getting medications?: No    Lack of Transportation (Non-Medical): No  Physical Activity: Not on file  Stress: Not on file  Social Connections: Unknown (07/26/2021)   Received from [facility]   Social Network    Social Network: Not on file  Intimate Partner Violence: Unknown (06/16/2021)   Received from [facility]   HITS    Physically Hurt: Not on file    Insult or Talk Down To: Not on file    Threaten Physical Harm: Not on file    Scream or Curse: Not on file    Family History  Problem Relation Age of Onset   Lung cancer Mother    Ovarian cancer Paternal Grandmother    Breast cancer Neg Hx     Allergies  Allergen Reactions   Celebrex [Celecoxib] Swelling   Dilaudid [Hydromorphone] Itching   Fentanyl Other (See Comments)    Hallucinations.     Review of Systems  Constitutional: Negative.   HENT: Negative.    Eyes: Negative.   Respiratory: Negative.  Negative for shortness of breath.   Cardiovascular: Negative.  Negative for chest pain.  Gastrointestinal: Negative.  Negative for abdominal pain, constipation and diarrhea.  Genitourinary: Negative.   Musculoskeletal:  Negative for joint pain and myalgias.  Skin: Negative.   Neurological: Negative.  Negative for dizziness and headaches.  Endo/Heme/Allergies: Negative.   All other systems reviewed and are negative.      Objective:   BP 116/68   Pulse 87   Ht 5\' 2"  (1.575 m)   Wt 291 lb 12.8 oz (132.4 kg)   LMP 04/24/2013 Comment: LMP >8YRS AGO  SpO2 97%    BMI 53.37 kg/m   Vitals:   10/24/22 0956  BP: 116/68  Pulse: 87  Height: 5\' 2"  (1.575 m)  Weight: 291 lb 12.8 oz (132.4 kg)  SpO2: 97%  BMI (Calculated): 53.36    Physical Exam Vitals and nursing note reviewed.  Constitutional:      Appearance: Normal appearance. She is normal weight.  HENT:     Head: Normocephalic and atraumatic.     Nose: Nose normal.     Mouth/Throat:     Mouth: Mucous membranes are moist.  Eyes:     Extraocular Movements: Extraocular movements intact.     Conjunctiva/sclera: Conjunctivae normal.     Pupils:  Pupils are equal, round, and reactive to light.  Cardiovascular:     Rate and Rhythm: Normal rate and regular rhythm.     Pulses: Normal pulses.     Heart sounds: Normal heart sounds.  Pulmonary:     Effort: Pulmonary effort is normal.     Breath sounds: Normal breath sounds.  Abdominal:     General: Abdomen is flat. Bowel sounds are normal.     Palpations: Abdomen is soft.  Musculoskeletal:        General: Normal range of motion.     Cervical back: Normal range of motion.  Skin:    General: Skin is warm and dry.  Neurological:     General: No focal deficit present.     Mental Status: She is alert and oriented to person, place, and time.  Psychiatric:        Mood and Affect: Mood normal.        Behavior: Behavior normal.        Thought Content: Thought content normal.        Judgment: Judgment normal.      No results found for any visits on 10/24/22.  Recent Results (from the past 2160 hour(s))  POCT CBG (Fasting - Glucose)     Status: Abnormal   Collection Time: 09/26/22 10:57 AM  Result Value Ref Range   Glucose Fasting, POC 316 (A) 70 - 99 mg/dL      Assessment & Plan:  Continue Nystatin cream as needed for vaginal itching. Increase Ozempic to 0.5 mg weekly.  Problem List Items Addressed This Visit       Cardiovascular and Mediastinum   Benign essential hypertension - Primary   Relevant Medications    hydrochlorothiazide (HYDRODIURIL) 25 MG tablet     Endocrine   Uncontrolled type 2 diabetes mellitus with hyperglycemia, without long-term current use of insulin (HCC)   Relevant Medications   Semaglutide,0.25 or 0.5MG /DOS, 2 MG/3ML SOPN     Other   Class 3 severe obesity due to excess calories without serious comorbidity with body mass index (BMI) of 50.0 to 59.9 in adult [facility])   Relevant Medications   Semaglutide,0.25 or 0.5MG /DOS, 2 MG/3ML SOPN    Return in about 4 weeks (around 11/21/2022) for keep September appt.   Total time spent: 25 minutes  Google, NP  10/24/2022   This document may have been prepared by Dragon Voice Recognition software and as such may include unintentional dictation errors.

## 2022-10-24 NOTE — Addendum Note (Signed)
Addended by: Marisue Ivan on: 10/24/2022 04:01 PM   Modules accepted: Orders

## 2022-11-17 ENCOUNTER — Telehealth: Payer: Self-pay | Admitting: Cardiology

## 2022-11-17 ENCOUNTER — Other Ambulatory Visit: Payer: Self-pay | Admitting: Cardiology

## 2022-11-17 MED ORDER — ZINC OXIDE 13 % EX CREA: TOPICAL_OINTMENT | Freq: Two times a day (BID) | CUTANEOUS | 3 refills | Status: DC

## 2022-11-17 NOTE — Telephone Encounter (Signed)
Joe from [facility] in [place] left a VM that the patient is complaining that the nystatin-triamcinolone ointment that was recently sent is too greasy. She would like for you to send the same medication in a cream form. Please advise.

## 2022-11-18 ENCOUNTER — Other Ambulatory Visit: Payer: Self-pay | Admitting: Internal Medicine

## 2022-11-18 DIAGNOSIS — J301 Allergic rhinitis due to pollen: Secondary | ICD-10-CM

## 2022-11-30 ENCOUNTER — Other Ambulatory Visit: Payer: Medicaid Other

## 2022-11-30 DIAGNOSIS — Z1329 Encounter for screening for other suspected endocrine disorder: Secondary | ICD-10-CM

## 2022-11-30 DIAGNOSIS — E1165 Type 2 diabetes mellitus with hyperglycemia: Secondary | ICD-10-CM

## 2022-11-30 DIAGNOSIS — I10 Essential (primary) hypertension: Secondary | ICD-10-CM

## 2022-11-30 DIAGNOSIS — Z13220 Encounter for screening for lipoid disorders: Secondary | ICD-10-CM

## 2022-11-30 DIAGNOSIS — E66813 Obesity, class 3: Secondary | ICD-10-CM

## 2022-12-01 LAB — CBC WITH DIFFERENTIAL/PLATELET
Basophils Absolute: 0 10*3/uL (ref 0.0–0.2)
Basos: 0 %
EOS (ABSOLUTE): 0.1 10*3/uL (ref 0.0–0.4)
Eos: 2 %
Hematocrit: 38.1 % (ref 34.0–46.6)
Hemoglobin: 12.2 g/dL (ref 11.1–15.9)
Immature Grans (Abs): 0 10*3/uL (ref 0.0–0.1)
Immature Granulocytes: 0 %
Lymphocytes Absolute: 1.5 10*3/uL (ref 0.7–3.1)
Lymphs: 25 %
MCH: 29.1 pg (ref 26.6–33.0)
MCHC: 32 g/dL (ref 31.5–35.7)
MCV: 91 fL (ref 79–97)
Monocytes Absolute: 0.6 10*3/uL (ref 0.1–0.9)
Monocytes: 10 %
Neutrophils Absolute: 3.6 10*3/uL (ref 1.4–7.0)
Neutrophils: 63 %
Platelets: 328 10*3/uL (ref 150–450)
RBC: 4.19 x10E6/uL (ref 3.77–5.28)
RDW: 13.4 % (ref 11.7–15.4)
WBC: 5.8 10*3/uL (ref 3.4–10.8)

## 2022-12-01 LAB — CMP14+EGFR
ALT: 15 IU/L (ref 0–32)
AST: 17 IU/L (ref 0–40)
Albumin: 4.3 g/dL (ref 3.8–4.9)
Alkaline Phosphatase: 109 IU/L (ref 44–121)
BUN/Creatinine Ratio: 21 (ref 12–28)
BUN: 11 mg/dL (ref 8–27)
Bilirubin Total: 0.3 mg/dL (ref 0.0–1.2)
CO2: 32 mmol/L — ABNORMAL HIGH (ref 20–29)
Calcium: 10.1 mg/dL (ref 8.7–10.3)
Chloride: 95 mmol/L — ABNORMAL LOW (ref 96–106)
Creatinine, Ser: 0.52 mg/dL — ABNORMAL LOW (ref 0.57–1.00)
Globulin, Total: 2.8 g/dL (ref 1.5–4.5)
Glucose: 112 mg/dL — ABNORMAL HIGH (ref 70–99)
Potassium: 4.6 mmol/L (ref 3.5–5.2)
Sodium: 142 mmol/L (ref 134–144)
Total Protein: 7.1 g/dL (ref 6.0–8.5)
eGFR: 106 mL/min/{1.73_m2} (ref >59)

## 2022-12-01 LAB — HEMOGLOBIN A1C
Est. average glucose Bld gHb Est-mCnc: 154 mg/dL
Hgb A1c MFr Bld: 7 % — ABNORMAL HIGH (ref 4.8–5.6)

## 2022-12-01 LAB — LIPID PANEL
Chol/HDL Ratio: 2.2 ratio (ref 0.0–4.4)
Cholesterol, Total: 155 mg/dL (ref 100–199)
HDL: 69 mg/dL (ref >39)
LDL Chol Calc (NIH): 73 mg/dL (ref 0–99)
Triglycerides: 68 mg/dL (ref 0–149)
VLDL Cholesterol Cal: 13 mg/dL (ref 5–40)

## 2022-12-01 LAB — TSH: TSH: 0.502 u[IU]/mL (ref 0.450–4.500)

## 2022-12-04 ENCOUNTER — Other Ambulatory Visit: Payer: Self-pay | Admitting: Cardiology

## 2022-12-04 ENCOUNTER — Ambulatory Visit: Payer: Medicaid Other | Admitting: Cardiology

## 2022-12-04 ENCOUNTER — Encounter: Payer: Self-pay | Admitting: Cardiology

## 2022-12-04 VITALS — BP 112/72 | HR 83 | Ht 62.0 in | Wt 279.0 lb

## 2022-12-04 DIAGNOSIS — I10 Essential (primary) hypertension: Secondary | ICD-10-CM

## 2022-12-04 DIAGNOSIS — E1165 Type 2 diabetes mellitus with hyperglycemia: Secondary | ICD-10-CM | POA: Diagnosis not present

## 2022-12-04 DIAGNOSIS — Z23 Encounter for immunization: Secondary | ICD-10-CM

## 2022-12-04 MED ORDER — SEMAGLUTIDE (1 MG/DOSE) 4 MG/3ML ~~LOC~~ SOPN: 1.0000 mg | PEN_INJECTOR | SUBCUTANEOUS | 6 refills | Status: DC

## 2022-12-04 MED ORDER — FLUCONAZOLE 150 MG PO TABS: ORAL_TABLET | ORAL | 6 refills | Status: DC

## 2022-12-04 MED ORDER — NYSTATIN 100000 UNIT/GM EX CREA: 1.0000 | TOPICAL_CREAM | Freq: Two times a day (BID) | CUTANEOUS | 4 refills | Status: DC

## 2022-12-04 MED ORDER — ZINC OXIDE 13 % EX CREA: TOPICAL_OINTMENT | Freq: Two times a day (BID) | CUTANEOUS | 1 refills | Status: DC | PRN

## 2022-12-04 NOTE — Progress Notes (Signed)
Established Patient Office Visit  Subjective:  Patient ID: Patty Leon, female    DOB: April 03, 1960  Age: 62 y.o. MRN: [id number]  Chief Complaint  Patient presents with   Follow-up    5 month follow up    Patient in office for 5 month follow up, discuss recent lab work. Patient reports feeling well. No complaints today. Patient taking and tolerating all of her medications. Patient weight down 12 lbs since visit 5 weeks ago. Patient asking to increase her Ozempic. New prescription sent to the pharmacy.  Up to date on her health maintenance exams Discussed recent lab work. LDL at goal. Hgb A1c improving.     No other concerns at this time.   Past Medical History:  Diagnosis Date   Acute exacerbation of CHF (congestive heart failure) (HCC) 10/17/2020   Anxiety    Arthritis    COPD (chronic obstructive pulmonary disease) (HCC)    Diabetes mellitus without complication (HCC)    Diverticulosis    Fluid retention    GERD (gastroesophageal reflux disease)    History of blood clots    Hypercholesteremia    Hypertension    Obesity    Obesity, Class III, BMI 40-49.9 (morbid obesity) (HCC) 07/24/2020   Panic attacks    Shortness of breath dyspnea    Sleep apnea    Use C-PAP with oxygen    Past Surgical History:  Procedure Laterality Date   BILATERAL CARPAL TUNNEL RELEASE     COLONOSCOPY     COLONOSCOPY N/A 01/22/2015   Procedure: COLONOSCOPY;  Surgeon: Elnita Maxwell, MD;  Location: [facility] ENDOSCOPY;  Service: Endoscopy;  Laterality: N/A;   COLONOSCOPY N/A 08/24/2021   Procedure: COLONOSCOPY;  Surgeon: Toledo, Boykin Nearing, MD;  Location: [facility] ENDOSCOPY;  Service: Gastroenterology;  Laterality: N/A;  DM   COLONOSCOPY WITH PROPOFOL N/A 07/12/2016   Procedure: COLONOSCOPY WITH PROPOFOL;  Surgeon: Scot Jun, MD;  Location: [facility] ENDOSCOPY;  Service: Endoscopy;  Laterality: N/A;   COLONOSCOPY WITH PROPOFOL N/A 04/26/2018   Procedure: COLONOSCOPY WITH PROPOFOL;  Surgeon:  Scot Jun, MD;  Location: [facility] ENDOSCOPY;  Service: Endoscopy;  Laterality: N/A;   ESOPHAGOGASTRODUODENOSCOPY (EGD) WITH PROPOFOL N/A 04/26/2018   Procedure: ESOPHAGOGASTRODUODENOSCOPY (EGD) WITH PROPOFOL;  Surgeon: Scot Jun, MD;  Location: [facility] ENDOSCOPY;  Service: Endoscopy;  Laterality: N/A;   JOINT REPLACEMENT Left 09/2015   KNEE ARTHROSCOPY Left    KNEE CLOSED REDUCTION Left 11/04/2015   Procedure: CLOSED MANIPULATION KNEE;  Surgeon: Kennedy Bucker, MD;  Location: [facility] ORS;  Service: Orthopedics;  Laterality: Left;   KNEE CLOSED REDUCTION Left 04/04/2016   Procedure: CLOSED MANIPULATION KNEE;  Surgeon: Kennedy Bucker, MD;  Location: [facility] ORS;  Service: Orthopedics;  Laterality: Left;   SHOULDER ARTHROSCOPY W/ ROTATOR CUFF REPAIR Right    TOTAL KNEE ARTHROPLASTY Left 10/07/2015   Procedure: TOTAL KNEE ARTHROPLASTY;  Surgeon: Kennedy Bucker, MD;  Location: [facility] ORS;  Service: Orthopedics;  Laterality: Left;   TOTAL KNEE REVISION Left 02/08/2016   Procedure: TOTAL KNEE REVISION;  Surgeon: Kennedy Bucker, MD;  Location: [facility] ORS;  Service: Orthopedics;  Laterality: Left;   TUBAL LIGATION      Social History   Socioeconomic History   Marital status: Married    Spouse name: Not on file   Number of children: Not on file   Years of education: Not on file   Highest education level: Not on file  Occupational History   Not on file  Tobacco Use  Smoking status: Every Day    Current packs/day: 0.50    Average packs/day: 0.5 packs/day for 30.0 years (15.0 ttl pk-yrs)    Types: Cigarettes   Smokeless tobacco: Never  Vaping Use   Vaping status: Never Used  Substance and Sexual Activity   Alcohol use: Not Currently   Drug use: Not Currently    Types: Oxycodone    Comment: as prescribed by MD   Sexual activity: Yes  Other Topics Concern   Not on file  Social History Narrative   Not on file   Social Determinants of Health   Financial Resource Strain: Low Risk  (10/04/2022)    Received from [facility]   Overall Financial Resource Strain (CARDIA)    Difficulty of Paying Living Expenses: Not hard at all  Food Insecurity: No Food Insecurity (10/04/2022)   Received from [facility]   Hunger Vital Sign    Worried About Running Out of Food in the Last Year: Never true    Ran Out of Food in the Last Year: Never true  Transportation Needs: No Transportation Needs (10/04/2022)   Received from [facility] - Transportation    In the past 12 months, has lack of transportation kept you from medical appointments or from getting medications?: No    Lack of Transportation (Non-Medical): No  Physical Activity: Not on file  Stress: Not on file  Social Connections: Unknown (07/26/2021)   Received from [facility]   Social Network    Social Network: Not on file  Intimate Partner Violence: Unknown (06/16/2021)   Received from [facility]   HITS    Physically Hurt: Not on file    Insult or Talk Down To: Not on file    Threaten Physical Harm: Not on file    Scream or Curse: Not on file    Family History  Problem Relation Age of Onset   Lung cancer Mother    Ovarian cancer Paternal Grandmother    Breast cancer Neg Hx     Allergies  Allergen Reactions   Celebrex [Celecoxib] Swelling   Dilaudid [Hydromorphone] Itching   Fentanyl Other (See Comments)    Hallucinations.     Review of Systems  Constitutional: Negative.   HENT: Negative.    Eyes: Negative.   Respiratory: Negative.  Negative for shortness of breath.   Cardiovascular: Negative.  Negative for chest pain.  Gastrointestinal: Negative.  Negative for abdominal pain, constipation and diarrhea.  Genitourinary: Negative.   Musculoskeletal:  Negative for joint pain and myalgias.  Skin: Negative.   Neurological: Negative.  Negative for dizziness and headaches.  Endo/Heme/Allergies: Negative.   All other systems  reviewed and are negative.      Objective:   BP 112/72   Pulse 83   Ht 5\' 2"  (1.575 m)   Wt 279 lb (126.6 kg)   LMP 04/24/2013 Comment: LMP >63YRS AGO  SpO2 99%   BMI 51.03 kg/m   Vitals:   12/04/22 0852  BP: 112/72  Pulse: 83  Height: 5\' 2"  (1.575 m)  Weight: 279 lb (126.6 kg)  SpO2: 99%  BMI (Calculated): 51.02    Physical Exam Vitals and nursing note reviewed.  Constitutional:      Appearance: Normal appearance. She is normal weight.  HENT:     Head: Normocephalic and atraumatic.     Nose: Nose normal.     Mouth/Throat:     Mouth: Mucous  membranes are moist.  Eyes:     Extraocular Movements: Extraocular movements intact.     Conjunctiva/sclera: Conjunctivae normal.     Pupils: Pupils are equal, round, and reactive to light.  Cardiovascular:     Rate and Rhythm: Normal rate and regular rhythm.     Pulses: Normal pulses.     Heart sounds: Normal heart sounds.  Pulmonary:     Effort: Pulmonary effort is normal.     Breath sounds: Normal breath sounds.  Abdominal:     General: Abdomen is flat. Bowel sounds are normal.     Palpations: Abdomen is soft.  Musculoskeletal:        General: Normal range of motion.     Cervical back: Normal range of motion.  Skin:    General: Skin is warm and dry.  Neurological:     General: No focal deficit present.     Mental Status: She is alert and oriented to person, place, and time.  Psychiatric:        Mood and Affect: Mood normal.        Behavior: Behavior normal.        Thought Content: Thought content normal.        Judgment: Judgment normal.      No results found for any visits on 12/04/22.  Recent Results (from the past 2160 hour(s))  POCT CBG (Fasting - Glucose)     Status: Abnormal   Collection Time: 09/26/22 10:57 AM  Result Value Ref Range   Glucose Fasting, POC 316 (A) 70 - 99 mg/dL  Hemoglobin G2X     Status: Abnormal   Collection Time: 11/30/22  8:42 AM  Result Value Ref Range   Hgb A1c MFr Bld 7.0  (H) 4.8 - 5.6 %    Comment:          Prediabetes: 5.7 - 6.4          Diabetes: >6.4          Glycemic control for adults with diabetes: <7.0    Est. average glucose Bld gHb Est-mCnc 154 mg/dL  TSH     Status: None   Collection Time: 11/30/22  8:42 AM  Result Value Ref Range   TSH 0.502 0.450 - 4.500 uIU/mL  CMP14+EGFR     Status: Abnormal   Collection Time: 11/30/22  8:42 AM  Result Value Ref Range   Glucose 112 (H) 70 - 99 mg/dL   BUN 11 8 - 27 mg/dL   Creatinine, Ser 5.28 (L) 0.57 - 1.00 mg/dL   eGFR 413 >24 MW/NUU/7.25   BUN/Creatinine Ratio 21 12 - 28   Sodium 142 134 - 144 mmol/L   Potassium 4.6 3.5 - 5.2 mmol/L   Chloride 95 (L) 96 - 106 mmol/L   CO2 32 (H) 20 - 29 mmol/L   Calcium 10.1 8.7 - 10.3 mg/dL   Total Protein 7.1 6.0 - 8.5 g/dL   Albumin 4.3 3.8 - 4.9 g/dL   Globulin, Total 2.8 1.5 - 4.5 g/dL   Bilirubin Total 0.3 0.0 - 1.2 mg/dL   Alkaline Phosphatase 109 44 - 121 IU/L   AST 17 0 - 40 IU/L   ALT 15 0 - 32 IU/L  Lipid panel     Status: None   Collection Time: 11/30/22  8:42 AM  Result Value Ref Range   Cholesterol, Total 155 100 - 199 mg/dL   Triglycerides 68 0 - 149 mg/dL   HDL 69 >36 mg/dL  VLDL Cholesterol Cal 13 5 - 40 mg/dL   LDL Chol Calc (NIH) 73 0 - 99 mg/dL   Chol/HDL Ratio 2.2 0.0 - 4.4 ratio    Comment:                                   T. Chol/HDL Ratio                                             Men  Women                               1/2 Avg.Risk  3.4    3.3                                   Avg.Risk  5.0    4.4                                2X Avg.Risk  9.6    7.1                                3X Avg.Risk 23.4   11.0   CBC with Differential/Platelet     Status: None   Collection Time: 11/30/22  8:42 AM  Result Value Ref Range   WBC 5.8 3.4 - 10.8 x10E3/uL   RBC 4.19 3.77 - 5.28 x10E6/uL   Hemoglobin 12.2 11.1 - 15.9 g/dL   Hematocrit 87.5 64.3 - 46.6 %   MCV 91 79 - 97 fL   MCH 29.1 26.6 - 33.0 pg   MCHC 32.0 31.5 - 35.7 g/dL    RDW 32.9 51.8 - 84.1 %   Platelets 328 150 - 450 x10E3/uL   Neutrophils 63 Not Estab. %   Lymphs 25 Not Estab. %   Monocytes 10 Not Estab. %   Eos 2 Not Estab. %   Basos 0 Not Estab. %   Neutrophils Absolute 3.6 1.4 - 7.0 x10E3/uL   Lymphocytes Absolute 1.5 0.7 - 3.1 x10E3/uL   Monocytes Absolute 0.6 0.1 - 0.9 x10E3/uL   EOS (ABSOLUTE) 0.1 0.0 - 0.4 x10E3/uL   Basophils Absolute 0.0 0.0 - 0.2 x10E3/uL   Immature Granulocytes 0 Not Estab. %   Immature Grans (Abs) 0.0 0.0 - 0.1 x10E3/uL      Assessment & Plan:  Increase Ozempic to 1 mg.  Continue all other medications.  Fasting labs prior to next visit.   Problem List Items Addressed This Visit       Cardiovascular and Mediastinum   Benign essential hypertension - Primary     Endocrine   Uncontrolled type 2 diabetes mellitus with hyperglycemia, without long-term current use of insulin (HCC)   Relevant Medications   Semaglutide, 1 MG/DOSE, 4 MG/3ML SOPN    Return in about 5 months (around 05/06/2023) for with fasting labs prior.   Total time spent: 25 minutes  Google, NP  12/04/2022   This document may have been prepared by Dragon Voice Recognition software and as such may include unintentional dictation errors.

## 2022-12-04 NOTE — Addendum Note (Signed)
Addended by: Marisue Ivan on: 12/04/2022 11:13 AM   Modules accepted: Orders

## 2022-12-06 ENCOUNTER — Other Ambulatory Visit: Payer: Self-pay | Admitting: Cardiology

## 2022-12-07 ENCOUNTER — Other Ambulatory Visit: Payer: Self-pay | Admitting: Cardiology

## 2022-12-07 MED ORDER — MUPIROCIN CALCIUM 2 % EX CREA: 1.0000 | TOPICAL_CREAM | Freq: Two times a day (BID) | CUTANEOUS | 0 refills | Status: DC

## 2022-12-11 ENCOUNTER — Other Ambulatory Visit: Payer: Self-pay | Admitting: Internal Medicine

## 2022-12-11 DIAGNOSIS — K219 Gastro-esophageal reflux disease without esophagitis: Secondary | ICD-10-CM

## 2022-12-11 NOTE — Addendum Note (Signed)
Addended by: Marisue Ivan on: 12/11/2022 02:15 PM   Modules accepted: Orders

## 2022-12-14 ENCOUNTER — Other Ambulatory Visit: Payer: Self-pay | Admitting: Cardiology

## 2022-12-14 DIAGNOSIS — E119 Type 2 diabetes mellitus without complications: Secondary | ICD-10-CM

## 2022-12-14 MED ORDER — XARELTO 20 MG PO TABS: 20.0000 mg | ORAL_TABLET | Freq: Every day | ORAL | 1 refills | Status: DC

## 2022-12-14 MED ORDER — METFORMIN HCL 500 MG PO TABS
500.0000 mg | ORAL_TABLET | Freq: Two times a day (BID) | ORAL | 1 refills | Status: DC
Start: 2022-12-14 — End: 2023-05-29

## 2022-12-14 MED ORDER — LOSARTAN POTASSIUM 25 MG PO TABS: 25.0000 mg | ORAL_TABLET | Freq: Every day | ORAL | 1 refills | Status: DC

## 2023-01-05 ENCOUNTER — Other Ambulatory Visit: Payer: Self-pay | Admitting: Cardiology

## 2023-01-05 ENCOUNTER — Telehealth: Payer: Self-pay

## 2023-01-05 MED ORDER — OZEMPIC (2 MG/DOSE) 8 MG/3ML ~~LOC~~ SOPN
2.0000 mg | PEN_INJECTOR | SUBCUTANEOUS | 3 refills | Status: DC
Start: 2023-01-05 — End: 2023-05-01

## 2023-01-05 NOTE — Telephone Encounter (Signed)
Patient is requesting for you to send in the 2mg  dose of ozempic

## 2023-01-15 ENCOUNTER — Other Ambulatory Visit: Payer: Self-pay | Admitting: Internal Medicine

## 2023-03-19 ENCOUNTER — Encounter: Payer: Self-pay | Admitting: Internal Medicine

## 2023-03-28 ENCOUNTER — Other Ambulatory Visit: Payer: Self-pay

## 2023-03-28 MED ORDER — CETIRIZINE HCL 10 MG PO TABS: 10.0000 mg | ORAL_TABLET | Freq: Every day | ORAL | 1 refills | Status: DC

## 2023-04-30 ENCOUNTER — Other Ambulatory Visit: Payer: Medicaid Other

## 2023-04-30 DIAGNOSIS — I10 Essential (primary) hypertension: Secondary | ICD-10-CM

## 2023-04-30 DIAGNOSIS — E66813 Obesity, class 3: Secondary | ICD-10-CM

## 2023-04-30 DIAGNOSIS — E1165 Type 2 diabetes mellitus with hyperglycemia: Secondary | ICD-10-CM

## 2023-05-01 ENCOUNTER — Other Ambulatory Visit: Payer: Self-pay | Admitting: Cardiology

## 2023-05-01 LAB — CMP14+EGFR
ALT: 24 [IU]/L (ref 0–32)
AST: 18 [IU]/L (ref 0–40)
Albumin: 4.3 g/dL (ref 3.9–4.9)
Alkaline Phosphatase: 110 [IU]/L (ref 44–121)
BUN/Creatinine Ratio: 22 (ref 12–28)
BUN: 13 mg/dL (ref 8–27)
Bilirubin Total: 0.3 mg/dL (ref 0.0–1.2)
CO2: 34 mmol/L — ABNORMAL HIGH (ref 20–29)
Calcium: 10 mg/dL (ref 8.7–10.3)
Chloride: 93 mmol/L — ABNORMAL LOW (ref 96–106)
Creatinine, Ser: 0.58 mg/dL (ref 0.57–1.00)
Globulin, Total: 2.6 g/dL (ref 1.5–4.5)
Glucose: 101 mg/dL — ABNORMAL HIGH (ref 70–99)
Potassium: 3.6 mmol/L (ref 3.5–5.2)
Sodium: 139 mmol/L (ref 134–144)
Total Protein: 6.9 g/dL (ref 6.0–8.5)
eGFR: 103 mL/min/{1.73_m2} (ref >59)

## 2023-05-01 LAB — HEMOGLOBIN A1C
Est. average glucose Bld gHb Est-mCnc: 134 mg/dL
Hgb A1c MFr Bld: 6.3 % — ABNORMAL HIGH (ref 4.8–5.6)

## 2023-05-01 LAB — LIPID PANEL
Chol/HDL Ratio: 2.1 {ratio} (ref 0.0–4.4)
Cholesterol, Total: 185 mg/dL (ref 100–199)
HDL: 88 mg/dL (ref >39)
LDL Chol Calc (NIH): 85 mg/dL (ref 0–99)
Triglycerides: 63 mg/dL (ref 0–149)
VLDL Cholesterol Cal: 12 mg/dL (ref 5–40)

## 2023-05-01 LAB — TSH: TSH: 0.697 u[IU]/mL (ref 0.450–4.500)

## 2023-05-07 ENCOUNTER — Ambulatory Visit (INDEPENDENT_AMBULATORY_CARE_PROVIDER_SITE_OTHER): Payer: Medicaid Other | Admitting: Cardiology

## 2023-05-07 ENCOUNTER — Encounter: Payer: Self-pay | Admitting: Cardiology

## 2023-05-07 VITALS — BP 124/62 | HR 90 | Ht 62.0 in | Wt 276.0 lb

## 2023-05-07 DIAGNOSIS — I10 Essential (primary) hypertension: Secondary | ICD-10-CM

## 2023-05-07 DIAGNOSIS — E782 Mixed hyperlipidemia: Secondary | ICD-10-CM | POA: Diagnosis not present

## 2023-05-07 DIAGNOSIS — E1165 Type 2 diabetes mellitus with hyperglycemia: Secondary | ICD-10-CM | POA: Diagnosis not present

## 2023-05-07 LAB — POCT UA - MICROALBUMIN
Albumin/Creatinine Ratio, Urine, POC: <30
Creatinine, POC: 50 mg/dL
Microalbumin Ur, POC: 10 mg/L

## 2023-05-07 MED ORDER — MOUNJARO 2.5 MG/0.5ML ~~LOC~~ SOAJ: 2.5000 mg | SUBCUTANEOUS | 3 refills | Status: DC

## 2023-05-07 NOTE — Progress Notes (Signed)
 Established Patient Office Visit  Subjective:  Patient ID: Patty Leon, female    DOB: 1961-01-19  Age: 63 y.o. MRN: [id number]  Chief Complaint  Patient presents with   Follow-up    5 Months Follow up    Patient in office for 5 month follow up, discuss recent lab results. Patient doing well over all. Complains of joint pain from rheumatoid arthritis, managed by rheumatology.  Discussed recent lab work. LDL elevated. Hgb A1c improved. Patient requesting change form Ozempic to [facility]. Will send Mounjaro in. Will recheck lab work in 4 months.  Urine micro albumin today.    No other concerns at this time.   Past Medical History:  Diagnosis Date   Acute exacerbation of CHF (congestive heart failure) (HCC) 10/17/2020   Anxiety    Arthritis    COPD (chronic obstructive pulmonary disease) (HCC)    Diabetes mellitus without complication (HCC)    Diverticulosis    Fluid retention    GERD (gastroesophageal reflux disease)    History of blood clots    Hypercholesteremia    Hypertension    Obesity    Obesity, Class III, BMI 40-49.9 (morbid obesity) (HCC) 07/24/2020   Panic attacks    Shortness of breath dyspnea    Sleep apnea    Use C-PAP with oxygen    Past Surgical History:  Procedure Laterality Date   BILATERAL CARPAL TUNNEL RELEASE     COLONOSCOPY     COLONOSCOPY N/A 01/22/2015   Procedure: COLONOSCOPY;  Surgeon: Elnita Maxwell, MD;  Location: [facility] ENDOSCOPY;  Service: Endoscopy;  Laterality: N/A;   COLONOSCOPY N/A 08/24/2021   Procedure: COLONOSCOPY;  Surgeon: Toledo, Boykin Nearing, MD;  Location: [facility] ENDOSCOPY;  Service: Gastroenterology;  Laterality: N/A;  DM   COLONOSCOPY WITH PROPOFOL N/A 07/12/2016   Procedure: COLONOSCOPY WITH PROPOFOL;  Surgeon: Scot Jun, MD;  Location: [facility] ENDOSCOPY;  Service: Endoscopy;  Laterality: N/A;   COLONOSCOPY WITH PROPOFOL N/A 04/26/2018   Procedure: COLONOSCOPY WITH PROPOFOL;  Surgeon: Scot Jun, MD;  Location: [facility]  ENDOSCOPY;  Service: Endoscopy;  Laterality: N/A;   ESOPHAGOGASTRODUODENOSCOPY (EGD) WITH PROPOFOL N/A 04/26/2018   Procedure: ESOPHAGOGASTRODUODENOSCOPY (EGD) WITH PROPOFOL;  Surgeon: Scot Jun, MD;  Location: [facility] ENDOSCOPY;  Service: Endoscopy;  Laterality: N/A;   JOINT REPLACEMENT Left 09/2015   KNEE ARTHROSCOPY Left    KNEE CLOSED REDUCTION Left 11/04/2015   Procedure: CLOSED MANIPULATION KNEE;  Surgeon: Kennedy Bucker, MD;  Location: [facility] ORS;  Service: Orthopedics;  Laterality: Left;   KNEE CLOSED REDUCTION Left 04/04/2016   Procedure: CLOSED MANIPULATION KNEE;  Surgeon: Kennedy Bucker, MD;  Location: [facility] ORS;  Service: Orthopedics;  Laterality: Left;   SHOULDER ARTHROSCOPY W/ ROTATOR CUFF REPAIR Right    TOTAL KNEE ARTHROPLASTY Left 10/07/2015   Procedure: TOTAL KNEE ARTHROPLASTY;  Surgeon: Kennedy Bucker, MD;  Location: [facility] ORS;  Service: Orthopedics;  Laterality: Left;   TOTAL KNEE REVISION Left 02/08/2016   Procedure: TOTAL KNEE REVISION;  Surgeon: Kennedy Bucker, MD;  Location: [facility] ORS;  Service: Orthopedics;  Laterality: Left;   TUBAL LIGATION      Social History   Socioeconomic History   Marital status: Married    Spouse name: Not on file   Number of children: Not on file   Years of education: Not on file   Highest education level: Not on file  Occupational History   Not on file  Tobacco Use   Smoking status: Every Day    Current packs/day:  0.50    Average packs/day: 0.5 packs/day for 30.0 years (15.0 ttl pk-yrs)    Types: Cigarettes   Smokeless tobacco: Never  Vaping Use   Vaping status: Never Used  Substance and Sexual Activity   Alcohol use: Not Currently   Drug use: Not Currently    Types: Oxycodone    Comment: as prescribed by MD   Sexual activity: Yes  Other Topics Concern   Not on file  Social History Narrative   Not on file   Social Drivers of Health   Financial Resource Strain: Low Risk  (10/04/2022)   Received from [facility]    Overall Financial Resource Strain (CARDIA)    Difficulty of Paying Living Expenses: Not hard at all  Food Insecurity: No Food Insecurity (10/04/2022)   Received from [facility]   Hunger Vital Sign    Ran Out of Food in the Last Year: Never true    Worried About Running Out of Food in the Last Year: Never true  Transportation Needs: No Transportation Needs (10/04/2022)   Received from [facility]   PRAPARE - Transportation    Lack of Transportation (Non-Medical): No    In the past 12 months, has lack of transportation kept you from medical appointments or from getting medications?: No  Physical Activity: Not on file  Stress: Not on file  Social Connections: Unknown (07/26/2021)   Received from [facility]   Social Network    Social Network: Not on file  Intimate Partner Violence: Unknown (06/16/2021)   Received from [facility]   HITS    Physically Hurt: Not on file    Insult or Talk Down To: Not on file    Threaten Physical Harm: Not on file    Scream or Curse: Not on file    Family History  Problem Relation Age of Onset   Lung cancer Mother    Ovarian cancer Paternal Grandmother    Breast cancer Neg Hx     Allergies  Allergen Reactions   Celebrex [Celecoxib] Swelling   Dilaudid [Hydromorphone] Itching   Fentanyl Other (See Comments)    Hallucinations.     Outpatient Medications Prior to Visit  Medication Sig   ACCU-CHEK GUIDE test strip CHECK SUGAR DAILY IN AM FASTING DX CODE E11.65   albuterol (VENTOLIN HFA) 108 (90 Base) MCG/ACT inhaler Inhale 2 puffs into the lungs every 6 (six) hours as needed for wheezing.   cetirizine (ZYRTEC) 10 MG tablet Take 1 tablet (10 mg total) by mouth daily.   Cholecalciferol (VITAMIN D) 2000 units CAPS Take 2,000 Units by mouth daily.   docusate sodium (COLACE) 100 MG capsule TAKE 1 CAPSULE BY MOUTH EVERY DAY   fluticasone (FLONASE) 50 MCG/ACT nasal spray USE AS  DIRECTED 1 SPRAY PER NARES DAILY   folic acid (FOLVITE) 1 MG tablet Take 1 mg by mouth daily.   hydrochlorothiazide (HYDRODIURIL) 25 MG tablet Take 25 mg by mouth daily.   JARDIANCE 25 MG TABS tablet TAKE 1 TABLET BY MOUTH EVERY DAY IN THE MORNING   KLOR-CON M10 10 MEQ tablet Take 10 mEq by mouth daily.   linaclotide (LINZESS) 290 MCG CAPS capsule Take 1 capsule (290 mcg total) by mouth daily before breakfast.   losartan (COZAAR) 25 MG tablet Take 1 tablet (25 mg total) by mouth daily.   metFORMIN (GLUCOPHAGE) 500 MG tablet Take 1 tablet (500 mg total) by mouth 2 (two) times daily.  methotrexate (RHEUMATREX) 2.5 MG tablet Take 12.5 mg by mouth once a week.   metoprolol (LOPRESSOR) 50 MG tablet Take 50 mg by mouth every morning.    nystatin cream (MYCOSTATIN) Apply 1 Application topically 2 (two) times daily.   omeprazole (PRILOSEC) 40 MG capsule TAKE 1 CAPSULE BY MOUTH EVERY DAY IN THE MORNING 1 HOUR BEFORE BREAKFAST AS NEEDED FOR ACID REFLUX   oxyCODONE (OXY IR/ROXICODONE) 5 MG immediate release tablet Take 5 mg by mouth 4 (four) times daily as needed.   roflumilast (DALIRESP) 500 MCG TABS tablet Take 500 mcg by mouth every morning.    rosuvastatin (CRESTOR) 40 MG tablet TAKE 1 TABLET BY MOUTH NIGHTLY AT BEDTIME (NOTE INCREASED DOSAGE)   TRELEGY ELLIPTA 100-62.5-25 MCG/INH AEPB Inhale into the lungs daily.   XARELTO 20 MG TABS tablet Take 1 tablet (20 mg total) by mouth daily.   [DISCONTINUED] Semaglutide, 2 MG/DOSE, (OZEMPIC, 2 MG/DOSE,) 8 MG/3ML SOPN INJECT 2 MG INTO THE SKIN ONCE A WEEK.   ENBREL SURECLICK 50 MG/ML injection Inject 50 mg into the skin once a week.   furosemide (LASIX) 20 MG tablet Take 1 tablet (20 mg total) by mouth daily. (Patient not taking: Reported on 12/04/2022)   OXYGEN Inhale into the lungs.   [DISCONTINUED] fluconazole (DIFLUCAN) 150 MG tablet Take one  tablet every 72 hours x2 doses   [DISCONTINUED] mupirocin 2% oint-hydrocortisone 2.5% cream-nystatin cream-zinc  oxide 13% oint 1:1:1:5 mixture Apply topically 2 (two) times daily as needed.   [DISCONTINUED] mupirocin cream (BACTROBAN) 2 % Apply 1 Application topically 2 (two) times daily.   No facility-administered medications prior to visit.    Review of Systems  Constitutional: Negative.   HENT: Negative.    Eyes: Negative.   Respiratory: Negative.  Negative for shortness of breath.   Cardiovascular: Negative.  Negative for chest pain.  Gastrointestinal: Negative.  Negative for abdominal pain, constipation and diarrhea.  Genitourinary: Negative.   Musculoskeletal:  Positive for joint pain. Negative for myalgias.  Skin: Negative.   Neurological: Negative.  Negative for dizziness and headaches.  Endo/Heme/Allergies: Negative.   All other systems reviewed and are negative.      Objective:   BP 124/62   Pulse 90   Ht 5\' 2"  (1.575 m)   Wt 276 lb (125.2 kg)   LMP 04/24/2013 Comment: LMP >70YRS AGO  SpO2 98%   BMI 50.48 kg/m   Vitals:   05/07/23 0903  BP: 124/62  Pulse: 90  Height: 5\' 2"  (1.575 m)  Weight: 276 lb (125.2 kg)  SpO2: 98%  BMI (Calculated): 50.47    Physical Exam Vitals and nursing note reviewed.  Constitutional:      Appearance: Normal appearance. She is normal weight.  HENT:     Head: Normocephalic and atraumatic.     Nose: Nose normal.     Mouth/Throat:     Mouth: Mucous membranes are moist.  Eyes:     Extraocular Movements: Extraocular movements intact.     Conjunctiva/sclera: Conjunctivae normal.     Pupils: Pupils are equal, round, and reactive to light.  Cardiovascular:     Rate and Rhythm: Normal rate and regular rhythm.     Pulses: Normal pulses.     Heart sounds: Normal heart sounds.  Pulmonary:     Effort: Pulmonary effort is normal.     Breath sounds: Normal breath sounds.  Abdominal:     General: Abdomen is flat. Bowel sounds are normal.     Palpations: Abdomen is  soft.  Musculoskeletal:        General: Normal range of motion.      Cervical back: Normal range of motion.  Skin:    General: Skin is warm and dry.  Neurological:     General: No focal deficit present.     Mental Status: She is alert and oriented to person, place, and time.  Psychiatric:        Mood and Affect: Mood normal.        Behavior: Behavior normal.        Thought Content: Thought content normal.        Judgment: Judgment normal.      No results found for any visits on 05/07/23.  Recent Results (from the past 2160 hours)  Hemoglobin A1c     Status: Abnormal   Collection Time: 04/30/23  8:29 AM  Result Value Ref Range   Hgb A1c MFr Bld 6.3 (H) 4.8 - 5.6 %    Comment:          Prediabetes: 5.7 - 6.4          Diabetes: >6.4          Glycemic control for adults with diabetes: <7.0    Est. average glucose Bld gHb Est-mCnc 134 mg/dL  TSH     Status: None   Collection Time: 04/30/23  8:29 AM  Result Value Ref Range   TSH 0.697 0.450 - 4.500 uIU/mL  CMP14+EGFR     Status: Abnormal   Collection Time: 04/30/23  8:29 AM  Result Value Ref Range   Glucose 101 (H) 70 - 99 mg/dL   BUN 13 8 - 27 mg/dL   Creatinine, Ser 8.65 0.57 - 1.00 mg/dL   eGFR 784 >69 GE/XBM/8.41   BUN/Creatinine Ratio 22 12 - 28   Sodium 139 134 - 144 mmol/L   Potassium 3.6 3.5 - 5.2 mmol/L   Chloride 93 (L) 96 - 106 mmol/L   CO2 34 (H) 20 - 29 mmol/L   Calcium 10.0 8.7 - 10.3 mg/dL   Total Protein 6.9 6.0 - 8.5 g/dL   Albumin 4.3 3.9 - 4.9 g/dL   Globulin, Total 2.6 1.5 - 4.5 g/dL   Bilirubin Total 0.3 0.0 - 1.2 mg/dL   Alkaline Phosphatase 110 44 - 121 IU/L   AST 18 0 - 40 IU/L   ALT 24 0 - 32 IU/L  Lipid panel     Status: None   Collection Time: 04/30/23  8:29 AM  Result Value Ref Range   Cholesterol, Total 185 100 - 199 mg/dL   Triglycerides 63 0 - 149 mg/dL   HDL 88 >32 mg/dL   VLDL Cholesterol Cal 12 5 - 40 mg/dL   LDL Chol Calc (NIH) 85 0 - 99 mg/dL   Chol/HDL Ratio 2.1 0.0 - 4.4 ratio    Comment:                                   T. Chol/HDL Ratio                                              Men  Women  1/2 Avg.Risk  3.4    3.3                                   Avg.Risk  5.0    4.4                                2X Avg.Risk  9.6    7.1                                3X Avg.Risk 23.4   11.0       Assessment & Plan:  Change Ozempic to [facility]  Urine micro albumin today. Work on decreasing fat intake to lower LDL.   Problem List Items Addressed This Visit       Cardiovascular and Mediastinum   Benign essential hypertension - Primary     Endocrine   Uncontrolled type 2 diabetes mellitus with hyperglycemia, without long-term current use of insulin (HCC)   Relevant Medications   tirzepatide [facility]) 2.5 MG/0.5ML Pen   Other Relevant Orders   POCT Urine Albumin/Creatinine with ratio [[id number]]     Other   Mixed hyperlipidemia    Return in about 4 months (around 09/04/2023) for with fasting labs prior.   Total time spent: 25 minutes  [employer], NP  05/07/2023   This document may have been prepared by Dragon Voice Recognition software and as such may include unintentional dictation errors.

## 2023-05-17 ENCOUNTER — Other Ambulatory Visit: Payer: Self-pay | Admitting: Internal Medicine

## 2023-05-17 DIAGNOSIS — J301 Allergic rhinitis due to pollen: Secondary | ICD-10-CM

## 2023-05-22 ENCOUNTER — Ambulatory Visit (INDEPENDENT_AMBULATORY_CARE_PROVIDER_SITE_OTHER)

## 2023-05-22 ENCOUNTER — Ambulatory Visit: Admitting: Cardiology

## 2023-05-22 ENCOUNTER — Encounter: Payer: Self-pay | Admitting: Cardiology

## 2023-05-22 VITALS — BP 132/66 | HR 91 | Ht 62.0 in | Wt 280.0 lb

## 2023-05-22 DIAGNOSIS — M25511 Pain in right shoulder: Secondary | ICD-10-CM | POA: Insufficient documentation

## 2023-05-22 DIAGNOSIS — R7303 Prediabetes: Secondary | ICD-10-CM

## 2023-05-22 DIAGNOSIS — Z0130 Encounter for examination of blood pressure without abnormal findings: Secondary | ICD-10-CM

## 2023-05-22 MED ORDER — PREDNISONE 10 MG (21) PO TBPK: ORAL_TABLET | ORAL | 0 refills | Status: DC

## 2023-05-22 NOTE — Progress Notes (Signed)
 Established Patient Office Visit  Subjective:  Patient ID: Patty Leon, female    DOB: 08/10/1960  Age: 63 y.o. MRN: [id number]  Chief Complaint  Patient presents with   Acute Visit    R Shoulder Pain    Patient in office for an acute visit, complaining of right shoulder pain. Patient denies trauma or injury to her shoulder. She has tried a muscle relaxer, meloxicam, and is currently on daily prednisone. Also on oxycodone for her back. Patient states nothing is helping her pain. Will order a shoulder xray today. Will do a prednisone taper. Further management pending results of xray.  Shoulder Pain  The pain is present in the right shoulder. This is a new problem. The current episode started 1 to 4 weeks ago. There has been no history of extremity trauma. The problem occurs constantly. The problem has been gradually worsening.    No other concerns at this time.   Past Medical History:  Diagnosis Date   Acute exacerbation of CHF (congestive heart failure) (HCC) 10/17/2020   Anxiety    Arthritis    COPD (chronic obstructive pulmonary disease) (HCC)    Diabetes mellitus without complication (HCC)    Diverticulosis    Fluid retention    GERD (gastroesophageal reflux disease)    History of blood clots    Hypercholesteremia    Hypertension    Obesity    Obesity, Class III, BMI 40-49.9 (morbid obesity) (HCC) 07/24/2020   Panic attacks    Shortness of breath dyspnea    Sleep apnea    Use C-PAP with oxygen    Past Surgical History:  Procedure Laterality Date   BILATERAL CARPAL TUNNEL RELEASE     COLONOSCOPY     COLONOSCOPY N/A 01/22/2015   Procedure: COLONOSCOPY;  Surgeon: Elnita Maxwell, MD;  Location: [facility] ENDOSCOPY;  Service: Endoscopy;  Laterality: N/A;   COLONOSCOPY N/A 08/24/2021   Procedure: COLONOSCOPY;  Surgeon: Toledo, Boykin Nearing, MD;  Location: [facility] ENDOSCOPY;  Service: Gastroenterology;  Laterality: N/A;  DM   COLONOSCOPY WITH PROPOFOL N/A 07/12/2016    Procedure: COLONOSCOPY WITH PROPOFOL;  Surgeon: Scot Jun, MD;  Location: [facility] ENDOSCOPY;  Service: Endoscopy;  Laterality: N/A;   COLONOSCOPY WITH PROPOFOL N/A 04/26/2018   Procedure: COLONOSCOPY WITH PROPOFOL;  Surgeon: Scot Jun, MD;  Location: [facility] ENDOSCOPY;  Service: Endoscopy;  Laterality: N/A;   ESOPHAGOGASTRODUODENOSCOPY (EGD) WITH PROPOFOL N/A 04/26/2018   Procedure: ESOPHAGOGASTRODUODENOSCOPY (EGD) WITH PROPOFOL;  Surgeon: Scot Jun, MD;  Location: [facility] ENDOSCOPY;  Service: Endoscopy;  Laterality: N/A;   JOINT REPLACEMENT Left 09/2015   KNEE ARTHROSCOPY Left    KNEE CLOSED REDUCTION Left 11/04/2015   Procedure: CLOSED MANIPULATION KNEE;  Surgeon: Kennedy Bucker, MD;  Location: [facility] ORS;  Service: Orthopedics;  Laterality: Left;   KNEE CLOSED REDUCTION Left 04/04/2016   Procedure: CLOSED MANIPULATION KNEE;  Surgeon: Kennedy Bucker, MD;  Location: [facility] ORS;  Service: Orthopedics;  Laterality: Left;   SHOULDER ARTHROSCOPY W/ ROTATOR CUFF REPAIR Right    TOTAL KNEE ARTHROPLASTY Left 10/07/2015   Procedure: TOTAL KNEE ARTHROPLASTY;  Surgeon: Kennedy Bucker, MD;  Location: [facility] ORS;  Service: Orthopedics;  Laterality: Left;   TOTAL KNEE REVISION Left 02/08/2016   Procedure: TOTAL KNEE REVISION;  Surgeon: Kennedy Bucker, MD;  Location: [facility] ORS;  Service: Orthopedics;  Laterality: Left;   TUBAL LIGATION      Social History   Socioeconomic History   Marital status: Married    Spouse name: Not on  file   Number of children: Not on file   Years of education: Not on file   Highest education level: Not on file  Occupational History   Not on file  Tobacco Use   Smoking status: Every Day    Current packs/day: 0.50    Average packs/day: 0.5 packs/day for 30.0 years (15.0 ttl pk-yrs)    Types: Cigarettes   Smokeless tobacco: Never  Vaping Use   Vaping status: Never Used  Substance and Sexual Activity   Alcohol use: Not Currently   Drug use: Not Currently    Types:  Oxycodone    Comment: as prescribed by MD   Sexual activity: Yes  Other Topics Concern   Not on file  Social History Narrative   Not on file   Social Drivers of Health   Financial Resource Strain: Low Risk  (10/04/2022)   Received from [facility]   Overall Financial Resource Strain (CARDIA)    Difficulty of Paying Living Expenses: Not hard at all  Food Insecurity: No Food Insecurity (10/04/2022)   Received from [facility]   Hunger Vital Sign    Ran Out of Food in the Last Year: Never true    Worried About Running Out of Food in the Last Year: Never true  Transportation Needs: No Transportation Needs (10/04/2022)   Received from [facility]   PRAPARE - Transportation    Lack of Transportation (Non-Medical): No    In the past 12 months, has lack of transportation kept you from medical appointments or from getting medications?: No  Physical Activity: Not on file  Stress: Not on file  Social Connections: Unknown (07/26/2021)   Received from [facility]   Social Network    Social Network: Not on file  Intimate Partner Violence: Unknown (06/16/2021)   Received from [facility]   HITS    Physically Hurt: Not on file    Insult or Talk Down To: Not on file    Threaten Physical Harm: Not on file    Scream or Curse: Not on file    Family History  Problem Relation Age of Onset   Lung cancer Mother    Ovarian cancer Paternal Grandmother    Breast cancer Neg Hx     Allergies  Allergen Reactions   Celebrex [Celecoxib] Swelling   Dilaudid [Hydromorphone] Itching   Fentanyl Other (See Comments)    Hallucinations.     Outpatient Medications Prior to Visit  Medication Sig   tiZANidine (ZANAFLEX) 4 MG tablet Take 4 mg by mouth 2 (two) times daily.   ACCU-CHEK GUIDE test strip CHECK SUGAR DAILY IN AM FASTING DX CODE E11.65   albuterol (VENTOLIN HFA) 108 (90 Base) MCG/ACT inhaler Inhale 2 puffs  into the lungs every 6 (six) hours as needed for wheezing.   cetirizine (ZYRTEC) 10 MG tablet Take 1 tablet (10 mg total) by mouth daily.   Cholecalciferol (VITAMIN D) 2000 units CAPS Take 2,000 Units by mouth daily.   docusate sodium (COLACE) 100 MG capsule TAKE 1 CAPSULE BY MOUTH EVERY DAY   ENBREL SURECLICK 50 MG/ML injection Inject 50 mg into the skin once a week.   fluticasone (FLONASE) 50 MCG/ACT nasal spray USE AS DIRECTED 1 SPRAY PER NARES DAILY   folic acid (FOLVITE) 1 MG tablet Take 1 mg by mouth daily.   furosemide (LASIX) 20 MG tablet Take 1 tablet (20 mg total) by mouth daily. (Patient not taking:  Reported on 12/04/2022)   hydrochlorothiazide (HYDRODIURIL) 25 MG tablet Take 25 mg by mouth daily.   JARDIANCE 25 MG TABS tablet TAKE 1 TABLET BY MOUTH EVERY DAY IN THE MORNING   KLOR-CON M10 10 MEQ tablet Take 10 mEq by mouth daily.   linaclotide (LINZESS) 290 MCG CAPS capsule Take 1 capsule (290 mcg total) by mouth daily before breakfast.   losartan (COZAAR) 25 MG tablet Take 1 tablet (25 mg total) by mouth daily.   metFORMIN (GLUCOPHAGE) 500 MG tablet Take 1 tablet (500 mg total) by mouth 2 (two) times daily.   methotrexate (RHEUMATREX) 2.5 MG tablet Take 12.5 mg by mouth once a week.   metoprolol (LOPRESSOR) 50 MG tablet Take 50 mg by mouth every morning.    nystatin cream (MYCOSTATIN) Apply 1 Application topically 2 (two) times daily.   omeprazole (PRILOSEC) 40 MG capsule TAKE 1 CAPSULE BY MOUTH EVERY DAY IN THE MORNING 1 HOUR BEFORE BREAKFAST AS NEEDED FOR ACID REFLUX   oxyCODONE (OXY IR/ROXICODONE) 5 MG immediate release tablet Take 5 mg by mouth 4 (four) times daily as needed.   OXYGEN Inhale into the lungs.   predniSONE (DELTASONE) 5 MG tablet Take 5 mg by mouth daily.   roflumilast (DALIRESP) 500 MCG TABS tablet Take 500 mcg by mouth every morning.    rosuvastatin (CRESTOR) 40 MG tablet TAKE 1 TABLET BY MOUTH NIGHTLY AT BEDTIME (NOTE INCREASED DOSAGE)   tirzepatide (MOUNJARO)  2.5 MG/0.5ML Pen Inject 2.5 mg into the skin once a week.   TRELEGY ELLIPTA 100-62.5-25 MCG/INH AEPB Inhale into the lungs daily.   XARELTO 20 MG TABS tablet Take 1 tablet (20 mg total) by mouth daily.   No facility-administered medications prior to visit.    Review of Systems  Constitutional: Negative.   HENT: Negative.    Eyes: Negative.   Respiratory: Negative.  Negative for shortness of breath.   Cardiovascular: Negative.  Negative for chest pain.  Gastrointestinal: Negative.  Negative for abdominal pain, constipation and diarrhea.  Genitourinary: Negative.   Musculoskeletal:  Positive for joint pain. Negative for myalgias.  Skin: Negative.   Neurological: Negative.  Negative for dizziness and headaches.  Endo/Heme/Allergies: Negative.   All other systems reviewed and are negative.      Objective:   BP 132/66   Pulse 91   Ht 5\' 2"  (1.575 m)   Wt 280 lb (127 kg)   LMP 04/24/2013 Comment: LMP >11YRS AGO  SpO2 95%   BMI 51.21 kg/m   Vitals:   05/22/23 0908  BP: 132/66  Pulse: 91  Height: 5\' 2"  (1.575 m)  Weight: 280 lb (127 kg)  SpO2: 95%  BMI (Calculated): 51.2    Physical Exam Vitals and nursing note reviewed.  Constitutional:      Appearance: Normal appearance. She is normal weight.  HENT:     Head: Normocephalic and atraumatic.     Nose: Nose normal.     Mouth/Throat:     Mouth: Mucous membranes are moist.  Eyes:     Extraocular Movements: Extraocular movements intact.     Conjunctiva/sclera: Conjunctivae normal.     Pupils: Pupils are equal, round, and reactive to light.  Cardiovascular:     Rate and Rhythm: Normal rate and regular rhythm.     Pulses: Normal pulses.     Heart sounds: Normal heart sounds.  Pulmonary:     Effort: Pulmonary effort is normal.     Breath sounds: Normal breath sounds.  Abdominal:  General: Abdomen is flat. Bowel sounds are normal.     Palpations: Abdomen is soft.  Musculoskeletal:        General: Normal range of  motion.     Cervical back: Normal range of motion.  Skin:    General: Skin is warm and dry.  Neurological:     General: No focal deficit present.     Mental Status: She is alert and oriented to person, place, and time.  Psychiatric:        Mood and Affect: Mood normal.        Behavior: Behavior normal.        Thought Content: Thought content normal.        Judgment: Judgment normal.      No results found for any visits on 05/22/23.  Recent Results (from the past 2160 hours)  Hemoglobin A1c     Status: Abnormal   Collection Time: 04/30/23  8:29 AM  Result Value Ref Range   Hgb A1c MFr Bld 6.3 (H) 4.8 - 5.6 %    Comment:          Prediabetes: 5.7 - 6.4          Diabetes: >6.4          Glycemic control for adults with diabetes: <7.0    Est. average glucose Bld gHb Est-mCnc 134 mg/dL  TSH     Status: None   Collection Time: 04/30/23  8:29 AM  Result Value Ref Range   TSH 0.697 0.450 - 4.500 uIU/mL  CMP14+EGFR     Status: Abnormal   Collection Time: 04/30/23  8:29 AM  Result Value Ref Range   Glucose 101 (H) 70 - 99 mg/dL   BUN 13 8 - 27 mg/dL   Creatinine, Ser 4.78 0.57 - 1.00 mg/dL   eGFR 295 >62 ZH/YQM/5.78   BUN/Creatinine Ratio 22 12 - 28   Sodium 139 134 - 144 mmol/L   Potassium 3.6 3.5 - 5.2 mmol/L   Chloride 93 (L) 96 - 106 mmol/L   CO2 34 (H) 20 - 29 mmol/L   Calcium 10.0 8.7 - 10.3 mg/dL   Total Protein 6.9 6.0 - 8.5 g/dL   Albumin 4.3 3.9 - 4.9 g/dL   Globulin, Total 2.6 1.5 - 4.5 g/dL   Bilirubin Total 0.3 0.0 - 1.2 mg/dL   Alkaline Phosphatase 110 44 - 121 IU/L   AST 18 0 - 40 IU/L   ALT 24 0 - 32 IU/L  Lipid panel     Status: None   Collection Time: 04/30/23  8:29 AM  Result Value Ref Range   Cholesterol, Total 185 100 - 199 mg/dL   Triglycerides 63 0 - 149 mg/dL   HDL 88 >46 mg/dL   VLDL Cholesterol Cal 12 5 - 40 mg/dL   LDL Chol Calc (NIH) 85 0 - 99 mg/dL   Chol/HDL Ratio 2.1 0.0 - 4.4 ratio    Comment:                                   T.  Chol/HDL Ratio                                             Men  Women  1/2 Avg.Risk  3.4    3.3                                   Avg.Risk  5.0    4.4                                2X Avg.Risk  9.6    7.1                                3X Avg.Risk 23.4   11.0   POCT Urine Albumin/Creatinine with ratio [[id number]]     Status: Normal   Collection Time: 05/07/23  9:30 AM  Result Value Ref Range   Microalbumin Ur, POC 10 mg/L   Creatinine, POC 50 mg/dL   Albumin/Creatinine Ratio, Urine, POC <30       Assessment & Plan:  Shoulder xray today. Prednisone taper.   Problem List Items Addressed This Visit       Other   Acute pain of right shoulder - Primary   Relevant Orders   DG Shoulder Right    Return if symptoms worsen or fail to improve.   Total time spent: 25 minutes  [employer], NP  05/22/2023   This document may have been prepared by Dragon Voice Recognition software and as such may include unintentional dictation errors.

## 2023-05-28 ENCOUNTER — Other Ambulatory Visit: Payer: Self-pay | Admitting: Cardiology

## 2023-05-28 ENCOUNTER — Telehealth: Payer: Self-pay

## 2023-05-28 ENCOUNTER — Other Ambulatory Visit: Payer: Self-pay | Admitting: Internal Medicine

## 2023-05-28 DIAGNOSIS — E119 Type 2 diabetes mellitus without complications: Secondary | ICD-10-CM

## 2023-05-28 DIAGNOSIS — K219 Gastro-esophageal reflux disease without esophagitis: Secondary | ICD-10-CM

## 2023-05-28 NOTE — Telephone Encounter (Signed)
 Pt lvm requesting XR results- lvm for pt to call back, message also sent to pt via mychart  Per [facility]: Notify patient shoulder xray shows normal aging wear and tear. If prednisone does not help with her pain, we can refer her to ortho

## 2023-05-29 ENCOUNTER — Other Ambulatory Visit: Payer: Self-pay | Admitting: Cardiology

## 2023-05-29 DIAGNOSIS — M25511 Pain in right shoulder: Secondary | ICD-10-CM

## 2023-05-29 MED ORDER — ROSUVASTATIN CALCIUM 40 MG PO TABS: 40.0000 mg | ORAL_TABLET | Freq: Every day | ORAL | 3 refills | Status: AC

## 2023-05-29 MED ORDER — EMPAGLIFLOZIN 25 MG PO TABS
25.0000 mg | ORAL_TABLET | Freq: Every day | ORAL | 3 refills | Status: AC
Start: 2023-05-29 — End: ?

## 2023-07-04 ENCOUNTER — Telehealth: Payer: Self-pay | Admitting: Cardiology

## 2023-07-04 NOTE — Telephone Encounter (Signed)
 Patient left VM that she has a question for [facility] but did not state what her question is. Need to call and get more info.

## 2023-07-12 ENCOUNTER — Other Ambulatory Visit: Payer: Self-pay

## 2023-07-12 MED ORDER — ACCU-CHEK SOFTCLIX LANCETS MISC: 12 refills | Status: AC

## 2023-08-18 ENCOUNTER — Other Ambulatory Visit: Payer: Self-pay | Admitting: Cardiology

## 2023-08-25 ENCOUNTER — Other Ambulatory Visit: Payer: Self-pay | Admitting: Cardiology

## 2023-08-31 ENCOUNTER — Other Ambulatory Visit

## 2023-08-31 DIAGNOSIS — I10 Essential (primary) hypertension: Secondary | ICD-10-CM

## 2023-08-31 DIAGNOSIS — E1165 Type 2 diabetes mellitus with hyperglycemia: Secondary | ICD-10-CM

## 2023-08-31 DIAGNOSIS — Z6841 Body Mass Index (BMI) 40.0 and over, adult: Secondary | ICD-10-CM

## 2023-08-31 DIAGNOSIS — E66813 Obesity, class 3: Secondary | ICD-10-CM

## 2023-09-01 LAB — LIPID PANEL
Chol/HDL Ratio: 2 ratio (ref 0.0–4.4)
Cholesterol, Total: 167 mg/dL (ref 100–199)
HDL: 84 mg/dL (ref >39)
LDL Chol Calc (NIH): 71 mg/dL (ref 0–99)
Triglycerides: 60 mg/dL (ref 0–149)
VLDL Cholesterol Cal: 12 mg/dL (ref 5–40)

## 2023-09-01 LAB — CMP14+EGFR
ALT: 18 IU/L (ref 0–32)
AST: 20 IU/L (ref 0–40)
Albumin: 3.8 g/dL — ABNORMAL LOW (ref 3.9–4.9)
Alkaline Phosphatase: 110 IU/L (ref 44–121)
BUN/Creatinine Ratio: 26 (ref 12–28)
BUN: 12 mg/dL (ref 8–27)
Bilirubin Total: <0.2 mg/dL (ref 0.0–1.2)
CO2: 29 mmol/L (ref 20–29)
Calcium: 9.8 mg/dL (ref 8.7–10.3)
Chloride: 95 mmol/L — ABNORMAL LOW (ref 96–106)
Creatinine, Ser: 0.47 mg/dL — ABNORMAL LOW (ref 0.57–1.00)
Globulin, Total: 2.8 g/dL (ref 1.5–4.5)
Glucose: 112 mg/dL — ABNORMAL HIGH (ref 70–99)
Potassium: 3.7 mmol/L (ref 3.5–5.2)
Sodium: 142 mmol/L (ref 134–144)
Total Protein: 6.6 g/dL (ref 6.0–8.5)
eGFR: 108 mL/min/{1.73_m2} (ref >59)

## 2023-09-01 LAB — HEMOGLOBIN A1C
Est. average glucose Bld gHb Est-mCnc: 131 mg/dL
Hgb A1c MFr Bld: 6.2 % — ABNORMAL HIGH (ref 4.8–5.6)

## 2023-09-01 LAB — TSH: TSH: 0.689 u[IU]/mL (ref 0.450–4.500)

## 2023-09-03 ENCOUNTER — Ambulatory Visit: Payer: Self-pay | Admitting: Cardiology

## 2023-09-03 ENCOUNTER — Other Ambulatory Visit: Payer: Self-pay | Admitting: Specialist

## 2023-09-03 DIAGNOSIS — J449 Chronic obstructive pulmonary disease, unspecified: Secondary | ICD-10-CM

## 2023-09-03 DIAGNOSIS — Z87891 Personal history of nicotine dependence: Secondary | ICD-10-CM

## 2023-09-07 ENCOUNTER — Encounter: Payer: Self-pay | Admitting: Cardiology

## 2023-09-07 ENCOUNTER — Ambulatory Visit: Payer: Medicaid Other | Admitting: Cardiology

## 2023-09-07 VITALS — BP 128/78 | HR 86 | Ht 62.0 in | Wt 288.4 lb

## 2023-09-07 DIAGNOSIS — I10 Essential (primary) hypertension: Secondary | ICD-10-CM

## 2023-09-07 DIAGNOSIS — E1165 Type 2 diabetes mellitus with hyperglycemia: Secondary | ICD-10-CM | POA: Diagnosis not present

## 2023-09-07 DIAGNOSIS — F17219 Nicotine dependence, cigarettes, with unspecified nicotine-induced disorders: Secondary | ICD-10-CM

## 2023-09-07 DIAGNOSIS — E782 Mixed hyperlipidemia: Secondary | ICD-10-CM | POA: Diagnosis not present

## 2023-09-07 MED ORDER — MOUNJARO 5 MG/0.5ML ~~LOC~~ SOAJ: 5.0000 mg | SUBCUTANEOUS | 3 refills | Status: DC

## 2023-09-07 MED ORDER — FUROSEMIDE 20 MG PO TABS: 20.0000 mg | ORAL_TABLET | Freq: Every day | ORAL | 3 refills | Status: DC

## 2023-09-07 NOTE — Progress Notes (Signed)
 Established Patient Office Visit  Subjective:  Patient ID: Patty Leon, female    DOB: 20-Nov-1960  Age: 63 y.o. MRN: [id number]  Chief Complaint  Patient presents with   Follow-up    Patient in office for 4 month follow up, discuss recent lab results. Patient doing well, no new complaints. Seeing orthopaedics for rotator cuff tear, waiting for MRI to be scheduled. Seeing rheumatology for rheumatoid arthritis, waiting on infusions to be approved by insurance. Discussed recent lab work. Hgb A1c improved. LDL at goal.  Patient tolerating Mounjaro , will increase dose to 5 mg weekly.  Continue all other medications.     No other concerns at this time.   Past Medical History:  Diagnosis Date   Acute exacerbation of CHF (congestive heart failure) (HCC) 10/17/2020   Anxiety    Arthritis    COPD (chronic obstructive pulmonary disease) (HCC)    Diabetes mellitus without complication (HCC)    Diverticulosis    Fluid retention    GERD (gastroesophageal reflux disease)    History of blood clots    Hypercholesteremia    Hypertension    Obesity    Obesity, Class III, BMI 40-49.9 (morbid obesity) 07/24/2020   Panic attacks    Shortness of breath dyspnea    Sleep apnea    Use C-PAP with oxygen    Past Surgical History:  Procedure Laterality Date   BILATERAL CARPAL TUNNEL RELEASE     COLONOSCOPY     COLONOSCOPY N/A 01/22/2015   Procedure: COLONOSCOPY;  Surgeon: Donnice Vaughn Manes, MD;  Location: [facility] ENDOSCOPY;  Service: Endoscopy;  Laterality: N/A;   COLONOSCOPY N/A 08/24/2021   Procedure: COLONOSCOPY;  Surgeon: Toledo, Ladell POUR, MD;  Location: [facility] ENDOSCOPY;  Service: Gastroenterology;  Laterality: N/A;  DM   COLONOSCOPY WITH PROPOFOL  N/A 07/12/2016   Procedure: COLONOSCOPY WITH PROPOFOL ;  Surgeon: Lamar ONEIDA Holmes, MD;  Location: [facility] ENDOSCOPY;  Service: Endoscopy;  Laterality: N/A;   COLONOSCOPY WITH PROPOFOL  N/A 04/26/2018   Procedure: COLONOSCOPY WITH PROPOFOL ;  Surgeon:  Holmes Lamar ONEIDA, MD;  Location: [facility] ENDOSCOPY;  Service: Endoscopy;  Laterality: N/A;   ESOPHAGOGASTRODUODENOSCOPY (EGD) WITH PROPOFOL  N/A 04/26/2018   Procedure: ESOPHAGOGASTRODUODENOSCOPY (EGD) WITH PROPOFOL ;  Surgeon: Holmes Lamar ONEIDA, MD;  Location: [facility] ENDOSCOPY;  Service: Endoscopy;  Laterality: N/A;   JOINT REPLACEMENT Left 09/2015   KNEE ARTHROSCOPY Left    KNEE CLOSED REDUCTION Left 11/04/2015   Procedure: CLOSED MANIPULATION KNEE;  Surgeon: Ozell Flake, MD;  Location: [facility] ORS;  Service: Orthopedics;  Laterality: Left;   KNEE CLOSED REDUCTION Left 04/04/2016   Procedure: CLOSED MANIPULATION KNEE;  Surgeon: Ozell Flake, MD;  Location: [facility] ORS;  Service: Orthopedics;  Laterality: Left;   SHOULDER ARTHROSCOPY W/ ROTATOR CUFF REPAIR Right    TOTAL KNEE ARTHROPLASTY Left 10/07/2015   Procedure: TOTAL KNEE ARTHROPLASTY;  Surgeon: Ozell Flake, MD;  Location: [facility] ORS;  Service: Orthopedics;  Laterality: Left;   TOTAL KNEE REVISION Left 02/08/2016   Procedure: TOTAL KNEE REVISION;  Surgeon: Ozell Flake, MD;  Location: [facility] ORS;  Service: Orthopedics;  Laterality: Left;   TUBAL LIGATION      Social History   Socioeconomic History   Marital status: Married    Spouse name: Not on file   Number of children: Not on file   Years of education: Not on file   Highest education level: Not on file  Occupational History   Not on file  Tobacco Use   Smoking status: Every Day  Current packs/day: 0.50    Average packs/day: 0.5 packs/day for 30.0 years (15.0 ttl pk-yrs)    Types: Cigarettes   Smokeless tobacco: Never  Vaping Use   Vaping status: Never Used  Substance and Sexual Activity   Alcohol use: Not Currently   Drug use: Not Currently    Types: Oxycodone     Comment: as prescribed by MD   Sexual activity: Yes  Other Topics Concern   Not on file  Social History Narrative   Not on file   Social Drivers of Health   Financial Resource Strain: Low Risk  (10/04/2022)    Received from [facility]   Overall Financial Resource Strain (CARDIA)    Difficulty of Paying Living Expenses: Not hard at all  Food Insecurity: No Food Insecurity (10/04/2022)   Received from [facility]   Hunger Vital Sign    Within the past 12 months, the food you bought just didn't last and you didn't have money to get more.: Never true    Within the past 12 months, you worried that your food would run out before you got the money to buy more.: Never true  Transportation Needs: No Transportation Needs (10/04/2022)   Received from [facility]   PRAPARE - Transportation    Lack of Transportation (Non-Medical): No    In the past 12 months, has lack of transportation kept you from medical appointments or from getting medications?: No  Physical Activity: Not on file  Stress: Not on file  Social Connections: Unknown (07/26/2021)   Received from [facility]   Social Network    Social Network: Not on file  Intimate Partner Violence: Unknown (06/16/2021)   Received from [facility]   HITS    Physically Hurt: Not on file    Insult or Talk Down To: Not on file    Threaten Physical Harm: Not on file    Scream or Curse: Not on file    Family History  Problem Relation Age of Onset   Lung cancer Mother    Ovarian cancer Paternal Grandmother    Breast cancer Neg Hx     Allergies  Allergen Reactions   Celebrex [Celecoxib] Swelling   Dilaudid  [Hydromorphone ] Itching   Fentanyl  Other (See Comments)    Hallucinations.     Outpatient Medications Prior to Visit  Medication Sig   ACCU-CHEK GUIDE test strip CHECK SUGAR DAILY IN AM FASTING DX CODE E11.65   Accu-Chek Softclix Lancets lancets Use as instructed   albuterol  (VENTOLIN  HFA) 108 (90 Base) MCG/ACT inhaler Inhale 2 puffs into the lungs every 6 (six) hours as needed for wheezing.   cetirizine  (ZYRTEC ) 10 MG tablet TAKE 1 TABLET BY MOUTH EVERY DAY   Cholecalciferol  (VITAMIN D )  2000 units CAPS Take 2,000 Units by mouth daily.   docusate sodium  (COLACE) 100 MG capsule TAKE 1 CAPSULE BY MOUTH EVERY DAY   empagliflozin  (JARDIANCE ) 25 MG TABS tablet Take 1 tablet (25 mg total) by mouth daily.   ENBREL SURECLICK 50 MG/ML injection Inject 50 mg into the skin once a week.   fluticasone  (FLONASE ) 50 MCG/ACT nasal spray USE AS DIRECTED 1 SPRAY PER NARES DAILY   folic acid (FOLVITE) 1 MG tablet Take 1 mg by mouth daily.   LINZESS  290 MCG CAPS capsule TAKE 1 CAPSULE BY MOUTH DAILY BEFORE BREAKFAST. (Patient taking differently: as needed.)   losartan  (COZAAR ) 25 MG tablet TAKE 1 TABLET (25 MG TOTAL) BY MOUTH DAILY.  metFORMIN  (GLUCOPHAGE ) 500 MG tablet TAKE 1 TABLET BY MOUTH TWICE A DAY   methotrexate (RHEUMATREX) 2.5 MG tablet Take 12.5 mg by mouth once a week.   metoprolol  (LOPRESSOR ) 50 MG tablet Take 50 mg by mouth every morning.    omeprazole (PRILOSEC) 40 MG capsule TAKE 1 CAPSULE BY MOUTH EVERY DAY IN THE MORNING 1 HOUR BEFORE BREAKFAST AS NEEDED FOR ACID REFLUX   oxyCODONE  (OXY IR/ROXICODONE ) 5 MG immediate release tablet Take 5 mg by mouth 4 (four) times daily as needed.   OXYGEN Inhale into the lungs.   predniSONE  (DELTASONE ) 5 MG tablet Take 5 mg by mouth daily.   predniSONE  (STERAPRED UNI-PAK 21 TAB) 10 MG (21) TBPK tablet Take per package instructions   roflumilast  (DALIRESP ) 500 MCG TABS tablet Take 500 mcg by mouth every morning.    rosuvastatin  (CRESTOR ) 40 MG tablet Take 1 tablet (40 mg total) by mouth daily.   tiZANidine (ZANAFLEX) 4 MG tablet Take 4 mg by mouth 2 (two) times daily.   TRELEGY ELLIPTA 100-62.5-25 MCG/INH AEPB Inhale into the lungs daily.   XARELTO  20 MG TABS tablet TAKE 1 TABLET BY MOUTH EVERY DAY   [DISCONTINUED] tirzepatide  (MOUNJARO ) 2.5 MG/0.5ML Pen INJECT 2.5 MG SUBCUTANEOUSLY WEEKLY   hydrochlorothiazide  (HYDRODIURIL ) 25 MG tablet Take 25 mg by mouth daily.   KLOR-CON  M10 10 MEQ tablet Take 10 mEq by mouth daily.   nystatin  cream  (MYCOSTATIN ) Apply 1 Application topically 2 (two) times daily. (Patient not taking: Reported on 09/07/2023)   [DISCONTINUED] furosemide  (LASIX ) 20 MG tablet Take 1 tablet (20 mg total) by mouth daily. (Patient not taking: Reported on 09/07/2023)   No facility-administered medications prior to visit.    Review of Systems  Constitutional: Negative.   HENT: Negative.    Eyes: Negative.   Respiratory: Negative.  Negative for shortness of breath.   Cardiovascular: Negative.  Negative for chest pain.  Gastrointestinal: Negative.  Negative for abdominal pain, constipation and diarrhea.  Genitourinary: Negative.   Musculoskeletal:  Negative for joint pain and myalgias.  Skin: Negative.   Neurological: Negative.  Negative for dizziness and headaches.  Endo/Heme/Allergies: Negative.   All other systems reviewed and are negative.      Objective:   BP 128/78   Pulse 86   Ht 5' 2 (1.575 m)   Wt 288 lb 6.4 oz (130.8 kg)   LMP 04/24/2013 Comment: LMP >30YRS AGO  SpO2 92%   BMI 52.75 kg/m   Vitals:   09/07/23 0849  BP: 128/78  Pulse: 86  Height: 5' 2 (1.575 m)  Weight: 288 lb 6.4 oz (130.8 kg)  SpO2: 92%  BMI (Calculated): 52.74    Physical Exam Vitals and nursing note reviewed.  Constitutional:      Appearance: Normal appearance. She is normal weight.  HENT:     Head: Normocephalic and atraumatic.     Nose: Nose normal.     Mouth/Throat:     Mouth: Mucous membranes are moist.   Eyes:     Extraocular Movements: Extraocular movements intact.     Conjunctiva/sclera: Conjunctivae normal.     Pupils: Pupils are equal, round, and reactive to light.    Cardiovascular:     Rate and Rhythm: Normal rate and regular rhythm.     Pulses: Normal pulses.     Heart sounds: Normal heart sounds.  Pulmonary:     Effort: Pulmonary effort is normal.     Breath sounds: Normal breath sounds.  Abdominal:  General: Abdomen is flat. Bowel sounds are normal.     Palpations: Abdomen is  soft.   Musculoskeletal:        General: Normal range of motion.     Cervical back: Normal range of motion.   Skin:    General: Skin is warm and dry.   Neurological:     General: No focal deficit present.     Mental Status: She is alert and oriented to person, place, and time.   Psychiatric:        Mood and Affect: Mood normal.        Behavior: Behavior normal.        Thought Content: Thought content normal.        Judgment: Judgment normal.      No results found for any visits on 09/07/23.  Recent Results (from the past 2160 hours)  Lipid panel     Status: None   Collection Time: 08/31/23  8:53 AM  Result Value Ref Range   Cholesterol, Total 167 100 - 199 mg/dL   Triglycerides 60 0 - 149 mg/dL   HDL 84 >60 mg/dL   VLDL Cholesterol Cal 12 5 - 40 mg/dL   LDL Chol Calc (NIH) 71 0 - 99 mg/dL   Chol/HDL Ratio 2.0 0.0 - 4.4 ratio    Comment:                                   T. Chol/HDL Ratio                                             Men  Women                               1/2 Avg.Risk  3.4    3.3                                   Avg.Risk  5.0    4.4                                2X Avg.Risk  9.6    7.1                                3X Avg.Risk 23.4   11.0   CMP14+EGFR     Status: Abnormal   Collection Time: 08/31/23  8:53 AM  Result Value Ref Range   Glucose 112 (H) 70 - 99 mg/dL   BUN 12 8 - 27 mg/dL   Creatinine, Ser 9.52 (L) 0.57 - 1.00 mg/dL   eGFR 891 >40 fO/fpw/8.26   BUN/Creatinine Ratio 26 12 - 28   Sodium 142 134 - 144 mmol/L   Potassium 3.7 3.5 - 5.2 mmol/L   Chloride 95 (L) 96 - 106 mmol/L   CO2 29 20 - 29 mmol/L   Calcium  9.8 8.7 - 10.3 mg/dL   Total Protein 6.6 6.0 - 8.5 g/dL   Albumin 3.8 (L) 3.9 - 4.9 g/dL   Globulin, Total 2.8 1.5 - 4.5 g/dL  Bilirubin Total <0.2 0.0 - 1.2 mg/dL   Alkaline Phosphatase 110 44 - 121 IU/L   AST 20 0 - 40 IU/L   ALT 18 0 - 32 IU/L  TSH     Status: None   Collection Time: 08/31/23  8:53 AM  Result Value Ref  Range   TSH 0.689 0.450 - 4.500 uIU/mL  Hemoglobin A1c     Status: Abnormal   Collection Time: 08/31/23  8:53 AM  Result Value Ref Range   Hgb A1c MFr Bld 6.2 (H) 4.8 - 5.6 %    Comment:          Prediabetes: 5.7 - 6.4          Diabetes: >6.4          Glycemic control for adults with diabetes: <7.0    Est. average glucose Bld gHb Est-mCnc 131 mg/dL      Assessment & Plan:  Increase Mounjaro  to 5 mg  Follow up with specialists.  Problem List Items Addressed This Visit       Cardiovascular and Mediastinum   Benign essential hypertension - Primary   Relevant Medications   furosemide  (LASIX ) 20 MG tablet     Endocrine   Uncontrolled type 2 diabetes mellitus with hyperglycemia, without long-term current use of insulin  (HCC)   Relevant Medications   tirzepatide  (MOUNJARO ) 5 MG/0.5ML Pen     Other   Nicotine  dependence   Mixed hyperlipidemia   Relevant Medications   furosemide  (LASIX ) 20 MG tablet    Return in about 4 months (around 01/07/2024) for fasting labs prior.   Total time spent: 25 minutes  [employer], NP  09/07/2023   This document may have been prepared by Dragon Voice Recognition software and as such may include unintentional dictation errors.

## 2023-09-12 ENCOUNTER — Ambulatory Visit
Admission: RE | Admit: 2023-09-12 | Discharge: 2023-09-12 | Disposition: A | Discharge status: Home / Self Care | Source: Ambulatory Visit | Attending: Specialist | Admitting: Specialist

## 2023-09-12 DIAGNOSIS — Z87891 Personal history of nicotine dependence: Secondary | ICD-10-CM | POA: Diagnosis present

## 2023-09-12 DIAGNOSIS — J449 Chronic obstructive pulmonary disease, unspecified: Secondary | ICD-10-CM | POA: Insufficient documentation

## 2023-10-07 ENCOUNTER — Other Ambulatory Visit: Payer: Self-pay | Admitting: Family

## 2023-10-07 DIAGNOSIS — E1142 Type 2 diabetes mellitus with diabetic polyneuropathy: Secondary | ICD-10-CM

## 2023-10-22 ENCOUNTER — Other Ambulatory Visit: Payer: Self-pay

## 2023-10-22 MED ORDER — MOUNJARO 7.5 MG/0.5ML ~~LOC~~ SOAJ: 7.5000 mg | SUBCUTANEOUS | 1 refills | Status: DC

## 2023-10-31 ENCOUNTER — Encounter: Payer: Self-pay | Admitting: Cardiology

## 2023-10-31 ENCOUNTER — Ambulatory Visit: Admitting: Cardiology

## 2023-10-31 VITALS — BP 118/58 | HR 99 | Ht 62.0 in | Wt 298.0 lb

## 2023-10-31 DIAGNOSIS — Z0130 Encounter for examination of blood pressure without abnormal findings: Secondary | ICD-10-CM

## 2023-10-31 DIAGNOSIS — M25562 Pain in left knee: Secondary | ICD-10-CM | POA: Diagnosis not present

## 2023-10-31 DIAGNOSIS — E1165 Type 2 diabetes mellitus with hyperglycemia: Secondary | ICD-10-CM

## 2023-10-31 DIAGNOSIS — M25552 Pain in left hip: Secondary | ICD-10-CM | POA: Diagnosis not present

## 2023-10-31 MED ORDER — PREDNISONE 20 MG PO TABS: ORAL_TABLET | ORAL | 0 refills | Status: DC

## 2023-10-31 NOTE — Progress Notes (Signed)
 Established Patient Office Visit  Subjective:  Patient ID: Patty Leon, female    DOB: [birth year]  Age: 63 y.o. MRN: [id number]  Chief Complaint  Patient presents with   Acute Visit    Right hip,knee pain started 2 weeks ago. Groin pain started a few months ago was coming in and out but is now constant.     Patient in office for an acute visit complaining of left hip and left knee pain that started two weeks ago. Pain was intermittent, now it is all the time. Patient has a history of left knee replacement. Will send in prednisone  for immediate relief. Will refer to orthopaedics for further evaluation.     No other concerns at this time.   Past Medical History:  Diagnosis Date   Acute exacerbation of CHF (congestive heart failure) (HCC) 10/17/2020   Anxiety    Arthritis    COPD (chronic obstructive pulmonary disease) (HCC)    Diabetes mellitus without complication (HCC)    Diverticulosis    Fluid retention    GERD (gastroesophageal reflux disease)    History of blood clots    Hypercholesteremia    Hypertension    Obesity    Obesity, Class III, BMI 40-49.9 (morbid obesity) 07/24/2020   Panic attacks    Shortness of breath dyspnea    Sleep apnea    Use C-PAP with oxygen    Past Surgical History:  Procedure Laterality Date   BILATERAL CARPAL TUNNEL RELEASE     COLONOSCOPY     COLONOSCOPY N/A 01/22/2015   Procedure: COLONOSCOPY;  Surgeon: Donnice Vaughn Manes, MD;  Location: [facility] ENDOSCOPY;  Service: Endoscopy;  Laterality: N/A;   COLONOSCOPY N/A 08/24/2021   Procedure: COLONOSCOPY;  Surgeon: Toledo, Ladell POUR, MD;  Location: [facility] ENDOSCOPY;  Service: Gastroenterology;  Laterality: N/A;  DM   COLONOSCOPY WITH PROPOFOL  N/A 07/12/2016   Procedure: COLONOSCOPY WITH PROPOFOL ;  Surgeon: Lamar ONEIDA Holmes, MD;  Location: [facility] ENDOSCOPY;  Service: Endoscopy;  Laterality: N/A;   COLONOSCOPY WITH PROPOFOL  N/A 04/26/2018   Procedure: COLONOSCOPY WITH PROPOFOL ;  Surgeon: Holmes Lamar ONEIDA, MD;  Location: [facility] ENDOSCOPY;  Service: Endoscopy;  Laterality: N/A;   ESOPHAGOGASTRODUODENOSCOPY (EGD) WITH PROPOFOL  N/A 04/26/2018   Procedure: ESOPHAGOGASTRODUODENOSCOPY (EGD) WITH PROPOFOL ;  Surgeon: Holmes Lamar ONEIDA, MD;  Location: [facility] ENDOSCOPY;  Service: Endoscopy;  Laterality: N/A;   JOINT REPLACEMENT Left 09/2015   KNEE ARTHROSCOPY Left    KNEE CLOSED REDUCTION Left 11/04/2015   Procedure: CLOSED MANIPULATION KNEE;  Surgeon: Ozell Flake, MD;  Location: [facility] ORS;  Service: Orthopedics;  Laterality: Left;   KNEE CLOSED REDUCTION Left 04/04/2016   Procedure: CLOSED MANIPULATION KNEE;  Surgeon: Ozell Flake, MD;  Location: [facility] ORS;  Service: Orthopedics;  Laterality: Left;   SHOULDER ARTHROSCOPY W/ ROTATOR CUFF REPAIR Right    TOTAL KNEE ARTHROPLASTY Left 10/07/2015   Procedure: TOTAL KNEE ARTHROPLASTY;  Surgeon: Ozell Flake, MD;  Location: [facility] ORS;  Service: Orthopedics;  Laterality: Left;   TOTAL KNEE REVISION Left 02/08/2016   Procedure: TOTAL KNEE REVISION;  Surgeon: Ozell Flake, MD;  Location: [facility] ORS;  Service: Orthopedics;  Laterality: Left;   TUBAL LIGATION      Social History   Socioeconomic History   Marital status: Married    Spouse name: Not on file   Number of children: Not on file   Years of education: Not on file   Highest education level: Not on file  Occupational History   Not on file  Tobacco  Use   Smoking status: Every Day    Current packs/day: 0.50    Average packs/day: 0.5 packs/day for 30.0 years (15.0 ttl pk-yrs)    Types: Cigarettes   Smokeless tobacco: Never  Vaping Use   Vaping status: Never Used  Substance and Sexual Activity   Alcohol use: Not Currently   Drug use: Not Currently    Types: Oxycodone     Comment: as prescribed by MD   Sexual activity: Yes  Other Topics Concern   Not on file  Social History Narrative   Not on file   Social Drivers of Health   Financial Resource Strain: Low Risk  (10/04/2022)   Received from  [facility]   Overall Financial Resource Strain (CARDIA)    Difficulty of Paying Living Expenses: Not hard at all  Food Insecurity: No Food Insecurity (10/04/2022)   Received from [facility]   Hunger Vital Sign    Within the past 12 months, you worried that your food would run out before you got the money to buy more.: Never true    Within the past 12 months, the food you bought just didn't last and you didn't have money to get more.: Never true  Transportation Needs: No Transportation Needs (10/04/2022)   Received from [facility]   PRAPARE - Transportation    Lack of Transportation (Non-Medical): No    In the past 12 months, has lack of transportation kept you from medical appointments or from getting medications?: No  Physical Activity: Not on file  Stress: Not on file  Social Connections: Unknown (07/26/2021)   Received from [facility]   Social Network    Social Network: Not on file  Intimate Partner Violence: Unknown (06/16/2021)   Received from [facility]   HITS    Physically Hurt: Not on file    Insult or Talk Down To: Not on file    Threaten Physical Harm: Not on file    Scream or Curse: Not on file    Family History  Problem Relation Age of Onset   Lung cancer Mother    Ovarian cancer Paternal Grandmother    Breast cancer Neg Hx     Allergies  Allergen Reactions   Celebrex [Celecoxib] Swelling   Dilaudid  [Hydromorphone ] Itching   Fentanyl  Other (See Comments)    Hallucinations.     Outpatient Medications Prior to Visit  Medication Sig   ACCU-CHEK GUIDE TEST test strip CHECK SUGAR DAILY IN AM FASTING DX CODE E11.65   Accu-Chek Softclix Lancets lancets Use as instructed   albuterol  (VENTOLIN  HFA) 108 (90 Base) MCG/ACT inhaler Inhale 2 puffs into the lungs every 6 (six) hours as needed for wheezing.   cetirizine  (ZYRTEC ) 10 MG tablet TAKE 1 TABLET BY MOUTH EVERY DAY   Cholecalciferol  (VITAMIN D ) 2000 units  CAPS Take 2,000 Units by mouth daily.   docusate sodium  (COLACE) 100 MG capsule TAKE 1 CAPSULE BY MOUTH EVERY DAY   empagliflozin  (JARDIANCE ) 25 MG TABS tablet Take 1 tablet (25 mg total) by mouth daily.   fluticasone  (FLONASE ) 50 MCG/ACT nasal spray USE AS DIRECTED 1 SPRAY PER NARES DAILY   folic acid (FOLVITE) 1 MG tablet Take 1 mg by mouth daily.   furosemide  (LASIX ) 20 MG tablet Take 1 tablet (20 mg total) by mouth daily. (Patient taking differently: Take 20 mg by mouth as needed.)   hydrochlorothiazide  (HYDRODIURIL ) 25 MG tablet Take 25 mg by mouth daily.   hydroxychloroquine (  PLAQUENIL) 200 MG tablet Take 200 mg by mouth 2 (two) times daily.   KLOR-CON  M10 10 MEQ tablet Take 10 mEq by mouth daily.   LINZESS  290 MCG CAPS capsule TAKE 1 CAPSULE BY MOUTH DAILY BEFORE BREAKFAST. (Patient taking differently: Take 290 mcg by mouth as needed.)   losartan  (COZAAR ) 25 MG tablet TAKE 1 TABLET (25 MG TOTAL) BY MOUTH DAILY.   metFORMIN  (GLUCOPHAGE ) 500 MG tablet TAKE 1 TABLET BY MOUTH TWICE A DAY   methotrexate (RHEUMATREX) 2.5 MG tablet Take 12.5 mg by mouth once a week.   metoprolol  (LOPRESSOR ) 50 MG tablet Take 50 mg by mouth every morning.    omeprazole (PRILOSEC) 40 MG capsule TAKE 1 CAPSULE BY MOUTH EVERY DAY IN THE MORNING 1 HOUR BEFORE BREAKFAST AS NEEDED FOR ACID REFLUX   ORENCIA 250 MG injection Inject 1 mL into the vein as directed.   oxyCODONE -acetaminophen  (PERCOCET) 7.5-325 MG tablet Take 1 tablet by mouth every 6 (six) hours as needed.   OXYGEN Inhale into the lungs.   predniSONE  (DELTASONE ) 5 MG tablet Take 5 mg by mouth daily.   roflumilast  (DALIRESP ) 500 MCG TABS tablet Take 500 mcg by mouth every morning.    rosuvastatin  (CRESTOR ) 40 MG tablet Take 1 tablet (40 mg total) by mouth daily.   tirzepatide  (MOUNJARO ) 7.5 MG/0.5ML Pen Inject 7.5 mg into the skin once a week.   tiZANidine (ZANAFLEX) 4 MG tablet Take 4 mg by mouth 2 (two) times daily. (Patient taking differently: Take 4 mg  by mouth as needed.)   TRELEGY ELLIPTA 100-62.5-25 MCG/INH AEPB Inhale into the lungs daily.   XARELTO  20 MG TABS tablet TAKE 1 TABLET BY MOUTH EVERY DAY   [DISCONTINUED] ENBREL SURECLICK 50 MG/ML injection Inject 50 mg into the skin once a week.   [DISCONTINUED] nystatin  cream (MYCOSTATIN ) Apply 1 Application topically 2 (two) times daily. (Patient not taking: Reported on 10/31/2023)   [DISCONTINUED] oxyCODONE  (OXY IR/ROXICODONE ) 5 MG immediate release tablet Take 5 mg by mouth 4 (four) times daily as needed. (Patient not taking: Reported on 10/31/2023)   [DISCONTINUED] predniSONE  (STERAPRED UNI-PAK 21 TAB) 10 MG (21) TBPK tablet Take per package instructions (Patient not taking: Reported on 10/31/2023)   No facility-administered medications prior to visit.    Review of Systems  Constitutional: Negative.   HENT: Negative.    Eyes: Negative.   Respiratory: Negative.  Negative for shortness of breath.   Cardiovascular: Negative.  Negative for chest pain.  Gastrointestinal: Negative.  Negative for abdominal pain, constipation and diarrhea.  Genitourinary: Negative.   Musculoskeletal:  Positive for joint pain. Negative for myalgias.  Skin: Negative.   Neurological: Negative.  Negative for dizziness and headaches.  Endo/Heme/Allergies: Negative.   All other systems reviewed and are negative.      Objective:   BP (!) 118/58   Pulse 99   Ht 5' 2 (1.575 m)   Wt 298 lb (135.2 kg)   LMP 04/24/2013 Comment: LMP >48YRS AGO  SpO2 97%   BMI 54.50 kg/m   Vitals:   10/31/23 1053  BP: (!) 118/58  Pulse: 99  Height: 5' 2 (1.575 m)  Weight: 298 lb (135.2 kg)  SpO2: 97%  BMI (Calculated): 54.49    Physical Exam Vitals and nursing note reviewed.  Constitutional:      Appearance: Normal appearance. She is normal weight.  HENT:     Head: Normocephalic and atraumatic.     Nose: Nose normal.     Mouth/Throat:  Mouth: Mucous membranes are moist.  Eyes:     Extraocular Movements:  Extraocular movements intact.     Conjunctiva/sclera: Conjunctivae normal.     Pupils: Pupils are equal, round, and reactive to light.  Cardiovascular:     Rate and Rhythm: Normal rate and regular rhythm.     Pulses: Normal pulses.     Heart sounds: Normal heart sounds.  Pulmonary:     Effort: Pulmonary effort is normal.     Breath sounds: Normal breath sounds.  Abdominal:     General: Abdomen is flat. Bowel sounds are normal.     Palpations: Abdomen is soft.  Musculoskeletal:        General: Normal range of motion.     Cervical back: Normal range of motion.  Skin:    General: Skin is warm and dry.  Neurological:     General: No focal deficit present.     Mental Status: She is alert and oriented to person, place, and time.  Psychiatric:        Mood and Affect: Mood normal.        Behavior: Behavior normal.        Thought Content: Thought content normal.        Judgment: Judgment normal.      No results found for any visits on 10/31/23.  Recent Results (from the past 2160 hours)  Lipid panel     Status: None   Collection Time: 08/31/23  8:53 AM  Result Value Ref Range   Cholesterol, Total 167 100 - 199 mg/dL   Triglycerides 60 0 - 149 mg/dL   HDL 84 >60 mg/dL   VLDL Cholesterol Cal 12 5 - 40 mg/dL   LDL Chol Calc (NIH) 71 0 - 99 mg/dL   Chol/HDL Ratio 2.0 0.0 - 4.4 ratio    Comment:                                   T. Chol/HDL Ratio                                             Men  Women                               1/2 Avg.Risk  3.4    3.3                                   Avg.Risk  5.0    4.4                                2X Avg.Risk  9.6    7.1                                3X Avg.Risk 23.4   11.0   CMP14+EGFR     Status: Abnormal   Collection Time: 08/31/23  8:53 AM  Result Value Ref Range   Glucose 112 (H) 70 - 99 mg/dL   BUN 12 8 - 27 mg/dL   Creatinine, Ser 9.52 (  L) 0.57 - 1.00 mg/dL   eGFR 891 >40 fO/fpw/8.26   BUN/Creatinine Ratio 26 12 - 28    Sodium 142 134 - 144 mmol/L   Potassium 3.7 3.5 - 5.2 mmol/L   Chloride 95 (L) 96 - 106 mmol/L   CO2 29 20 - 29 mmol/L   Calcium  9.8 8.7 - 10.3 mg/dL   Total Protein 6.6 6.0 - 8.5 g/dL   Albumin 3.8 (L) 3.9 - 4.9 g/dL   Globulin, Total 2.8 1.5 - 4.5 g/dL   Bilirubin Total <9.7 0.0 - 1.2 mg/dL   Alkaline Phosphatase 110 44 - 121 IU/L   AST 20 0 - 40 IU/L   ALT 18 0 - 32 IU/L  TSH     Status: None   Collection Time: 08/31/23  8:53 AM  Result Value Ref Range   TSH 0.689 0.450 - 4.500 uIU/mL  Hemoglobin A1c     Status: Abnormal   Collection Time: 08/31/23  8:53 AM  Result Value Ref Range   Hgb A1c MFr Bld 6.2 (H) 4.8 - 5.6 %    Comment:          Prediabetes: 5.7 - 6.4          Diabetes: >6.4          Glycemic control for adults with diabetes: <7.0    Est. average glucose Bld gHb Est-mCnc 131 mg/dL      Assessment & Plan:  Prednisone  Referral to orthopaedics  Problem List Items Addressed This Visit       Other   Acute pain of left knee - Primary   Relevant Orders   Ambulatory referral to Orthopedic Surgery   Shower chair   Left hip pain   Relevant Orders   Ambulatory referral to Orthopedic Surgery   Shower chair    Return if symptoms worsen or fail to improve, for as scheduled.   Total time spent: 25 minutes  [employer], NP  10/31/2023   This document may have been prepared by Dragon Voice Recognition software and as such may include unintentional dictation errors.

## 2023-11-15 ENCOUNTER — Other Ambulatory Visit: Payer: Self-pay | Admitting: Internal Medicine

## 2023-11-15 DIAGNOSIS — J301 Allergic rhinitis due to pollen: Secondary | ICD-10-CM

## 2023-11-21 ENCOUNTER — Other Ambulatory Visit: Payer: Self-pay | Admitting: Cardiology

## 2023-11-21 DIAGNOSIS — E119 Type 2 diabetes mellitus without complications: Secondary | ICD-10-CM

## 2023-11-29 ENCOUNTER — Other Ambulatory Visit: Payer: Self-pay | Admitting: Cardiology

## 2023-11-29 DIAGNOSIS — Z6841 Body Mass Index (BMI) 40.0 and over, adult: Secondary | ICD-10-CM

## 2023-11-29 DIAGNOSIS — J441 Chronic obstructive pulmonary disease with (acute) exacerbation: Secondary | ICD-10-CM

## 2023-11-29 DIAGNOSIS — M1712 Unilateral primary osteoarthritis, left knee: Secondary | ICD-10-CM

## 2023-11-29 DIAGNOSIS — J9621 Acute and chronic respiratory failure with hypoxia: Secondary | ICD-10-CM

## 2023-12-05 NOTE — Progress Notes (Signed)
 ORTHOPEDIC SURGERY - SHOULDER EVALUATION  Chief Complaint: Chief Complaint  Patient presents with  . Follow-up    Right shoulder pain    History of Present Illness: 12/05/23: She returns today as she now thinks that she would be ready to proceed with scheduling for right shoulder surgery.  Shoulder symptoms are unchanged from prior visit 3 weeks ago.    She was having left hip and knee pain, but the current thought is that this may be coming from her spine.  She is now getting monthly infusions of Orencia for rheumatoid arthritis.   11/14/23: MATASHA SMIGELSKI is a 63 y.o. female  referred by [facility] for R shoulder evaluation and management.  Prior medical records were reviewed.  Labs were reviewed as well.  Hemoglobin A1c was 6.2 on 08/31/2023.  Of note, she is on chronic oxygen (3 L) for history of COPD.  She also has a history of CHF and prior DVT and is on Xarelto .  She was initially seen by Dr. Kathlynn on 06/07/23.  From his note at that time: JAMIRAH ZELAYA is a 63 year old female with a history of prior shoulder surgery who presents with right shoulder pain.   She has been experiencing right shoulder pain for more than a month without any known trauma. The pain is severe, disrupts her sleep by waking her at night, and prevents her from sleeping on her right side. She describes the pain as sharp, with a sensation of weight on her shoulder, and experiences spontaneous sharp pains.   The pain significantly affects her movement, especially when raising her arm, although she can do so with difficulty. She experiences weakness and stiffness in the shoulder, with pain during resisted external rotation and certain arm positions.   She underwent shoulder surgery in 2012 for a rotator cuff repair. At that time, an MRI revealed severe tendinosis, a small labral tear, and high-grade partial tears of the rotator cuff. She has a 3 cm scar over the distal acromion from this surgery.   Her medical history  includes knee surgery and elevated A1c levels, with the most recent being 6.3, down from a previous level of 7.  She underwent a corticosteroid injection to the subacromial space and was referred to physical therapy.  She was then reevaluated on 08/15/2023. She did undergo a course of physical therapy, but then began to develop increased pain without improvement with further therapy.  She then underwent an MRI and was referred to me for further evaluation.  She currently rates pain severity as a 6/10. Her symptoms began approximately 4-5 months ago without known traumatic event.  She has undergone corticosteroid injection (no significant benefit) to the subacromial space, and a formal course of physical therapy.  She thought that PT was helping until she went to reach for something in a cabinet and has had increased pain since that time without response to further therapy.  Symptoms are worsened with any sort of movement overhead or away from the body.  She is right-handed.  She is disabled due to oxygen requirement.  She lives at home with a friend.  She used to smoke, but has quit for many years.  PMHx, PSurgHx, Fam Hx, Soc Hx, Meds, Allergies: Past Medical History:  Diagnosis Date  . Anxiety   . Avascular necrosis of hip, left (CMS/HHS-HCC)   . Chronic airway obstruction, not elsewhere classified (CMS/HHS-HCC)   . Chronic bronchitis with acute exacerbation (CMS/HHS-HCC)   . Chronic obstructive asthma, unspecified (  CMS/HHS-HCC)   . Chronic respiratory failure with hypoxia (CMS/HHS-HCC)   . Constipation   . COPD, moderate (CMS/HHS-HCC) 08/05/2013  . Depression   . Diverticulosis 01/22/2015   colonic diverticulosis  . DM (diabetes mellitus) (CMS/HHS-HCC)   . DVT, lower extremity (CMS/HHS-HCC)   . Edema, unspecified   . HLD (hyperlipidemia)   . Hyperplastic colon polyp 01/22/2015  . Hypersomnia with sleep apnea, unspecified   . Hypertension    Admitted to [facility]  07/04/2008 with headaches and shortness of breath. Was noted to be in hypertensive urgency and was started on IV lobetalol.   . Lumbago with sciatica   . Lumbar disc disease with radiculopathy   . Nicotine  dependence   . Obesity   . Obstructive sleep apnea 08/05/2013   On cpap   . Osteoarthritis   . Reflux esophagitis   . Tubular adenoma of colon 01/22/2015  . Uterine enlargement 10/17/2021   most likely fibroids according to abd/pelvis CT    Past Surgical History:  Procedure Laterality Date  . COLONOSCOPY  10/02/2013   Tubulovillous Adenoma x 2  . COLONOSCOPY  01/22/2015   Adenomatous Polyps: CBF 01/2016  . TOTAL KNEE ARTHROPLASTY (Left) Left 10/07/2015   Dr. Kathlynn  . Revision L TKA Left 02/08/2016   Dr.Menz  . closed manipulation left knee Left 04/04/2016   Dr.Menz  . Closed manipulation left knee  Left 04/04/2016   Dr.Menz  . COLONOSCOPY  07/12/2016   Adenomatous Polyps: CBF 07/2021  . COLONOSCOPY  04/26/2018   Adenomatous Polyps: CBF 04/2021  . EGD  04/26/2018    Medium hiatal hernia: No repeat per RTE  . COLONOSCOPY  08/24/2021   H/O Colonic Polyps/Repeat 5 years/TKT  . ARTHROSCOPIC ROTATOR CUFF REPAIR Right   . EGD  > 10 yrs ago  . ENDOSCOPIC CARPAL TUNNEL RELEASE Bilateral   . Left TKA Manipulation 11/04/15     Dominique Kathlynn, MD  . TUBAL LIGATION Bilateral     Family History  Problem Relation Age of Onset  . COPD Mother   . Heart failure Mother   . Cancer Mother   . Colon cancer Neg Hx   . Colon polyps Neg Hx     Social History   Socioeconomic History  . Marital status: Single  Tobacco Use  . Smoking status: Former    Current packs/day: 0.50    Average packs/day: 0.5 packs/day for 41.0 years (20.5 ttl pk-yrs)    Types: Cigarettes    Passive exposure: Never  . Smokeless tobacco: Never  . Tobacco comments:    Quit 6/21  Vaping Use  . Vaping status: Never Used  Substance and Sexual Activity  . Alcohol use: No    Alcohol/week: 0.0 standard drinks of  alcohol  . Drug use: No  . Sexual activity: Defer   Social Drivers of Health   Financial Resource Strain: Low Risk  (12/05/2023)   Overall Financial Resource Strain (CARDIA)   . Difficulty of Paying Living Expenses: Not hard at all  Food Insecurity: No Food Insecurity (12/05/2023)   Hunger Vital Sign   . Worried About Programme researcher, broadcasting/film/video in the Last Year: Never true   . Ran Out of Food in the Last Year: Never true  Transportation Needs: No Transportation Needs (12/05/2023)   PRAPARE - Transportation   . Lack of Transportation (Medical): No   . Lack of Transportation (Non-Medical): No   Received from [facility]   Social Network  Housing Stability: Low  Risk  (12/05/2023)   Housing Stability Vital Sign   . Unable to Pay for Housing in the Last Year: No   . Number of Times Moved in the Last Year: 0   . Homeless in the Last Year: No     Current Outpatient Medications  Medication Sig Dispense Refill  . oxyCODONE -acetaminophen  (PERCOCET) 7.5-325 mg tablet 1 tablet as needed Orally every 6 hrs    . ACCU-CHEK AVIVA PLUS TEST STRP test strip CHECK FASTING BLOOD SUGARS ONCE DAILY  3  . ACCU-CHEK SOFTCLIX LANCETS lancets USE WITH BLOOD SUGAR TWICE DAILY E11.9    . cetirizine  (ZYRTEC ) 10 MG tablet TAKE 1 TABLET BY MOUTH NIGHTLY AT BEDTIME (STOP LORATADINE )    . cholecalciferol  (VITAMIN D3) 2,000 unit capsule Take 2,000 Units by mouth once daily.    SABRA docusate (COLACE) 100 MG capsule Take 100 mg by mouth once daily.  11  . ENBREL SURECLICK 50 mg/mL (1 mL) pen injector Inject 50 mg subcutaneously once a week    . ferrous sulfate  325 (65 FE) MG tablet Take 325 mg by mouth daily with breakfast    . fluticasone  propionate (FLONASE ) 50 mcg/actuation nasal spray Place 2 sprays into both nostrils once daily as needed for Rhinitis or Allergies    . folic acid (FOLVITE) 1 MG tablet Take 1 tablet (1 mg total) by mouth once daily 90 tablet 3  . FUROsemide  (LASIX ) 20 MG tablet Take 20 mg by mouth once  daily    . hydroCHLOROthiazide  (HYDRODIURIL ) 25 MG tablet take 1 tablet by mouth every day 90 tablet 3  . hydroxychloroquine (PLAQUENIL) 200 mg tablet Take 1 tablet (200 mg total) by mouth 2 (two) times daily 60 tablet 5  . JARDIANCE  25 mg Tab tablet Take by mouth once daily  5  . KLOR-CON  M10 10 mEq ER tablet take 1 tablet by mouth once daily 90 tablet 3  . LINZESS  290 mcg capsule TAKE 1 CAPSULE BY MOUTH DAILY IN AM 30 MIN PRIOR TO BREAKFAST WITH A LARGE GLASS OF WATER    . losartan  (COZAAR ) 25 MG tablet     . meloxicam (MOBIC) 7.5 MG tablet Take 7.5 mg by mouth 2 (two) times daily as needed for Pain    . metFORMIN  (GLUCOPHAGE ) 500 MG tablet Take 500 mg by mouth 2 (two) times daily.  2  . methotrexate (RHEUMATREX) 2.5 MG tablet Take 7 tablets (17.5 mg total) by mouth every 7 (seven) days All on the same day 84 tablet 1  . metoprolol  TARTrate (LOPRESSOR ) 25 MG tablet TAKE 1 TABLET BY MOUTH EVERY DAY 90 tablet 3  . MOUNJARO  2.5 mg/0.5 mL pen injector Inject 2.5 mg subcutaneously once a week    . NARCAN 4 mg/actuation nasal spray USE AS NEEDED FOR OPIOID OVERDOSE EMERGENCY    . nystatin  (MYCOSTATIN ) 100,000 unit/gram cream APPLY CREAM TO OUTER VAGINAL REGION EVERY 8 HOURS AS NEEDED    . omeprazole (PRILOSEC) 40 MG DR capsule Take 1 capsule by mouth once daily       . oxyCODONE  (ROXICODONE ) 5 MG immediate release tablet Take 5 mg by mouth 3 (three) times daily    . OXYGEN-AIR DELIVERY SYSTEMS MISC Use.    . predniSONE  (DELTASONE ) 5 MG tablet Take 1 tablet (5 mg total) by mouth once daily 90 tablet 1  . rivaroxaban  (XARELTO ) 20 mg tablet Take 1 tablet by mouth once daily.    . roflumilast  (DALIRESP ) 500 mcg tablet TAKE 1 TABLET BY MOUTH  ONCE DAILY. 90 tablet 2  . rosuvastatin  (CRESTOR ) 40 MG tablet Take 40 mg by mouth at bedtime    . tiZANidine (ZANAFLEX) 4 MG tablet TAKE 1 TABLET TWICE A DAY FOR MUSCLE SPASMS    . TRELEGY ELLIPTA 100-62.5-25 mcg inhaler INHALE 1 PUFF INTO THE LUNGS ONCE DAILY. 60  each 11  . VENTOLIN  HFA 90 mcg/actuation inhaler INHALE 2 PUFFS BY MOUTH EVERY 6 HOURS AS NEEDED FOR WHEEZE 18 each 6   No current facility-administered medications for this visit.    Allergies  Allergen Reactions  . Celebrex [Celecoxib] Swelling  . Fentanyl  Unknown  . Hydromorphone  Itching    Diluadid    Review of Systems: A 10+ ROS was performed, reviewed, and the pertinent orthopaedic findings are documented in the HPI.  I have reviewed and agree with the ROS captured by the CMA.    Physical Exam: There were no vitals filed for this visit. General/Constitutional: NAD, conversant Eyes: Pupils equal and round, extraocular movements intact ENT: atraumatic external nose and ears, moist mucous membranes Respiratory: non-labored breathing, symmetric chest rise Cardiovascular: no visible lower extremity edema, peripheral pulses present  Skin: normal skin turgor, warm and dry Neurological: cranial nerves grossly intact, sensation grossly intact Psychological:  Appropriate mood and affect; appropriate judgment Musculoskeletal: as detailed below:  Comprehensive Shoulder Exam:   ROM   Right Left  Active (Passive) Forward Elevation  130  150  ER 60 60  IR T12 T6  90 degree abduction ER/IR 90/50 105/60  Crepitus None None  Capsulitis None None                                                                                       Tenderness                                         Right Left  Significant over biceps and AC joint     Inspection   Right Left  Skin Well-healed mini open incision Normal  Scapular Kinetics    Atrophy      Impingement / Rotator Cuff   Right Left  Neer Impingement Yes No  Hawkins Yes No  Champagne Toast     Empty Can/Jobe's 5-/5 5/5  ER Strength 5/5 5/5  Bear Hug 4+/5 5/5  Belly Press Mildly abnormal normal  Hornblower's    ER Lag No No    AC / Biceps  / SLAP   Right Left  Speed's    Yergason's    O'Brien's    Cross Arm Adduction       Neurovascular   Right Left  Distal Motor Normal Normal  Distal Sensation Normal Normal  Distal Pulse Normal Normal    Imaging:  R Shoulder radiographs:  08/15/2023: On personal read, there are no fractures or dislocations.  The humeral head does appear to be in a slightly posteriorly rotated position.  There are moderate degenerative changes of the acromioclavicular joint.  Degenerative change of the glenohumeral joint difficult to assess given lack of axillary view or true AP  view.  R Shoulder MRI: 10/09/23: FINDINGS: . Bones: No evidence of fracture, aggressive bone lesion, or osteonecrosis. Spurring and subcortical cystic change of the greater tuberosity. . Soft tissues: Surgical artifact adjacent to the acromion. Small amount of subligamentous subdeltoid bursa fluid anteriorly. . Glenohumeral joint: Humeral head is normally located. Small effusion and synovitis. . Acromioclavicular joint: Severe arthrosis. Acromioplasty. . Articular cartilage: Intact. . . Supraspinatus tendon: Linear interstitial, bursal and articular surface partial tearing in the distal 1 cm. . Infraspinatus tendon: Tendinosis with linear interstitial and articular sided partial tearing of the distal 2 cm. . Subscapularis tendon: Mild tendinosis . Teres minor tendon: Intact. . . Long head of biceps tendon: Full-thickness retracted tear. . SABRA Leos structures: Absence of the superior labral biceps attachment. Small linear tear through the base of the labrum on coronal image 12. . . Additional shoulder girdle muscles: Intact.   IMPRESSION: Diffuse rotator cuff tendinosis. No high-grade retracted tears. There is multifocal partial tearing involving distal supraspinatus and infraspinatus.  Full-thickness retracted tear of the long head biceps.  Small glenohumeral effusion with nonspecific synovitis. Linear nondisplaced tear at the base of the superior labrum which is blunted peripherally at the  biceps defect.  AC joint arthrosis and previous acromioplasty.   Of note, there is fatty atrophy of the subscapularis, supraspinatus, and infraspinatus consistent with grade 2-3 change   I personally reviewed and visualized the aforementioned imaging studies. I additionally personally interpreted any radiographs taken during today's visit.   Assessment & Plan: There are no diagnoses linked to this encounter.   BETHENY SUCHECKI is a 63 y.o. female patient with R partial versus full-thickness tearing of the supraspinatus and subscapularis. 1.  We discussed the diagnosis as well as various treatment options.  At this point, she has undergone extensive conservative management without improvement in her symptoms.  We discussed the next best option for her was likely surgery.  This would likely involve right arthroscopic rotator cuff repair (supraspinatus and subscapularis), subacromial decompression, and distal clavicle excision.  After discussion of risks, benefits, and alternatives to surgery, the patient wishes to proceed with surgery.  Preferred surgical date is 01/22/2024 as she would like to have this performed after 01/18/2024. 2.  Physical therapy to start on postoperative day #3-4. 3.  Sling to be dispensed at preoperative visit 4.  Follow-up with me within 30 days of surgery.  Patient will need PAT preoperatively given COPD and home O2 requirement.  Also recommend obtaining pulmonary clearance.  Open allergist is Dr. Theotis.   Over 40 minutes were spent on this patient visit including reviewing prior records, imaging studies including MRI and/or radiographs and associated reports, coordination of future care, documentation of today's visit, and discussion with and counseling the patient in regards to the clinical findings, diagnosis, and treatment options.

## 2023-12-06 ENCOUNTER — Other Ambulatory Visit: Payer: Self-pay | Admitting: Cardiology

## 2023-12-12 ENCOUNTER — Encounter: Payer: Self-pay | Admitting: Cardiology

## 2023-12-12 ENCOUNTER — Ambulatory Visit: Admitting: Cardiology

## 2023-12-12 VITALS — BP 130/60 | HR 110 | Ht 62.0 in | Wt 301.0 lb

## 2023-12-12 DIAGNOSIS — Z0130 Encounter for examination of blood pressure without abnormal findings: Secondary | ICD-10-CM

## 2023-12-12 DIAGNOSIS — M25552 Pain in left hip: Secondary | ICD-10-CM

## 2023-12-12 DIAGNOSIS — R1030 Lower abdominal pain, unspecified: Secondary | ICD-10-CM | POA: Insufficient documentation

## 2023-12-12 DIAGNOSIS — Z23 Encounter for immunization: Secondary | ICD-10-CM

## 2023-12-12 DIAGNOSIS — Z131 Encounter for screening for diabetes mellitus: Secondary | ICD-10-CM

## 2023-12-12 MED ORDER — PREDNISONE 20 MG PO TABS: ORAL_TABLET | ORAL | 0 refills | Status: DC

## 2023-12-12 MED ORDER — MOUNJARO 10 MG/0.5ML ~~LOC~~ SOAJ: 10.0000 mg | SUBCUTANEOUS | 4 refills | Status: DC

## 2023-12-12 NOTE — Progress Notes (Signed)
 Established Patient Office Visit  Subjective:  Patient ID: Patty Leon, female    DOB: [birth year]  Age: 63 y.o. MRN: [id number]  Chief Complaint  Patient presents with   Acute Visit    Groin and hip pain    Patient in office for an acute visit, complaining of groin and hip pain. Patient seen by orthopaedics on 9/5/258. Hip xray revealed Left hip trochanteric bursitis. Administered a trochanteric bursa injection with 1 cc bimethasone and 2 ccs Marcaine  to the left hip. Educated her on bursitis and the IT band's role in symptoms. Orthopaedics recommended physical therapy. Patient hesitant but agreeable to try PT. Will send in prednisone .  Patient requesting the flu vaccination today.  Patient requesting to increase Mounaro, 10 mg weekly sent in.     No other concerns at this time.   Past Medical History:  Diagnosis Date   Acute exacerbation of CHF (congestive heart failure) (HCC) 10/17/2020   Anxiety    Arthritis    COPD (chronic obstructive pulmonary disease) (HCC)    Diabetes mellitus without complication (HCC)    Diverticulosis    Fluid retention    GERD (gastroesophageal reflux disease)    History of blood clots    Hypercholesteremia    Hypertension    Obesity    Obesity, Class III, BMI 40-49.9 (morbid obesity) (HCC) 07/24/2020   Panic attacks    Shortness of breath dyspnea    Sleep apnea    Use C-PAP with oxygen    Past Surgical History:  Procedure Laterality Date   BILATERAL CARPAL TUNNEL RELEASE     COLONOSCOPY     COLONOSCOPY N/A 01/22/2015   Procedure: COLONOSCOPY;  Surgeon: Donnice Vaughn Manes, MD;  Location: [facility] ENDOSCOPY;  Service: Endoscopy;  Laterality: N/A;   COLONOSCOPY N/A 08/24/2021   Procedure: COLONOSCOPY;  Surgeon: Toledo, Ladell POUR, MD;  Location: [facility] ENDOSCOPY;  Service: Gastroenterology;  Laterality: N/A;  DM   COLONOSCOPY WITH PROPOFOL  N/A 07/12/2016   Procedure: COLONOSCOPY WITH PROPOFOL ;  Surgeon: Lamar ONEIDA Holmes, MD;  Location: [facility]  ENDOSCOPY;  Service: Endoscopy;  Laterality: N/A;   COLONOSCOPY WITH PROPOFOL  N/A 04/26/2018   Procedure: COLONOSCOPY WITH PROPOFOL ;  Surgeon: Holmes Lamar ONEIDA, MD;  Location: [facility] ENDOSCOPY;  Service: Endoscopy;  Laterality: N/A;   ESOPHAGOGASTRODUODENOSCOPY (EGD) WITH PROPOFOL  N/A 04/26/2018   Procedure: ESOPHAGOGASTRODUODENOSCOPY (EGD) WITH PROPOFOL ;  Surgeon: Holmes Lamar ONEIDA, MD;  Location: [facility] ENDOSCOPY;  Service: Endoscopy;  Laterality: N/A;   JOINT REPLACEMENT Left 09/2015   KNEE ARTHROSCOPY Left    KNEE CLOSED REDUCTION Left 11/04/2015   Procedure: CLOSED MANIPULATION KNEE;  Surgeon: Ozell Flake, MD;  Location: [facility] ORS;  Service: Orthopedics;  Laterality: Left;   KNEE CLOSED REDUCTION Left 04/04/2016   Procedure: CLOSED MANIPULATION KNEE;  Surgeon: Ozell Flake, MD;  Location: [facility] ORS;  Service: Orthopedics;  Laterality: Left;   SHOULDER ARTHROSCOPY W/ ROTATOR CUFF REPAIR Right    TOTAL KNEE ARTHROPLASTY Left 10/07/2015   Procedure: TOTAL KNEE ARTHROPLASTY;  Surgeon: Ozell Flake, MD;  Location: [facility] ORS;  Service: Orthopedics;  Laterality: Left;   TOTAL KNEE REVISION Left 02/08/2016   Procedure: TOTAL KNEE REVISION;  Surgeon: Ozell Flake, MD;  Location: [facility] ORS;  Service: Orthopedics;  Laterality: Left;   TUBAL LIGATION      Social History   Socioeconomic History   Marital status: Married    Spouse name: Not on file   Number of children: Not on file   Years of education: Not on file  Highest education level: Not on file  Occupational History   Not on file  Tobacco Use   Smoking status: Every Day    Current packs/day: 0.50    Average packs/day: 0.5 packs/day for 30.0 years (15.0 ttl pk-yrs)    Types: Cigarettes   Smokeless tobacco: Never  Vaping Use   Vaping status: Never Used  Substance and Sexual Activity   Alcohol use: Not Currently   Drug use: Not Currently    Types: Oxycodone     Comment: as prescribed by MD   Sexual activity: Yes  Other Topics Concern    Not on file  Social History Narrative   Not on file   Social Drivers of Health   Financial Resource Strain: Low Risk  (12/05/2023)   Received from [facility]   Overall Financial Resource Strain (CARDIA)    Difficulty of Paying Living Expenses: Not hard at all  Food Insecurity: No Food Insecurity (12/05/2023)   Received from [facility]   Hunger Vital Sign    Within the past 12 months, you worried that your food would run out before you got the money to buy more.: Never true    Within the past 12 months, the food you bought just didn't last and you didn't have money to get more.: Never true  Transportation Needs: No Transportation Needs (12/05/2023)   Received from [facility] - Transportation    In the past 12 months, has lack of transportation kept you from medical appointments or from getting medications?: No    Lack of Transportation (Non-Medical): No  Physical Activity: Not on file  Stress: Not on file  Social Connections: Unknown (07/26/2021)   Received from [facility]   Social Network    Social Network: Not on file  Intimate Partner Violence: Unknown (06/16/2021)   Received from [facility]   HITS    Physically Hurt: Not on file    Insult or Talk Down To: Not on file    Threaten Physical Harm: Not on file    Scream or Curse: Not on file    Family History  Problem Relation Age of Onset   Lung cancer Mother    Ovarian cancer Paternal Grandmother    Breast cancer Neg Hx     Allergies  Allergen Reactions   Celebrex [Celecoxib] Swelling   Dilaudid  [Hydromorphone ] Itching   Fentanyl  Other (See Comments)    Hallucinations.     Outpatient Medications Prior to Visit  Medication Sig   ACCU-CHEK GUIDE TEST test strip CHECK SUGAR DAILY IN AM FASTING DX CODE E11.65   Accu-Chek Softclix Lancets lancets Use as instructed   albuterol  (VENTOLIN  HFA) 108 (90 Base) MCG/ACT inhaler Inhale 2 puffs into the lungs  every 6 (six) hours as needed for wheezing.   cetirizine  (ZYRTEC ) 10 MG tablet TAKE 1 TABLET BY MOUTH EVERY DAY   Cholecalciferol  (VITAMIN D ) 2000 units CAPS Take 2,000 Units by mouth daily.   docusate sodium  (COLACE) 100 MG capsule TAKE 1 CAPSULE BY MOUTH EVERY DAY   empagliflozin  (JARDIANCE ) 25 MG TABS tablet Take 1 tablet (25 mg total) by mouth daily.   fluticasone  (FLONASE ) 50 MCG/ACT nasal spray USE AS DIRECTED 1 SPRAY PER NARES DAILY   folic acid (FOLVITE) 1 MG tablet Take 1 mg by mouth daily.   furosemide  (LASIX ) 20 MG tablet Take 1 tablet (20 mg total) by mouth as needed.   hydrochlorothiazide  (HYDRODIURIL ) 25 MG tablet Take 25  mg by mouth daily.   hydroxychloroquine (PLAQUENIL) 200 MG tablet Take 200 mg by mouth 2 (two) times daily.   KLOR-CON  M10 10 MEQ tablet Take 10 mEq by mouth daily.   LINZESS  290 MCG CAPS capsule TAKE 1 CAPSULE BY MOUTH DAILY BEFORE BREAKFAST. (Patient taking differently: Take 290 mcg by mouth as needed.)   losartan  (COZAAR ) 25 MG tablet TAKE 1 TABLET (25 MG TOTAL) BY MOUTH DAILY.   metFORMIN  (GLUCOPHAGE ) 500 MG tablet TAKE 1 TABLET BY MOUTH TWICE A DAY   methotrexate (RHEUMATREX) 2.5 MG tablet Take 12.5 mg by mouth once a week.   metoprolol  (LOPRESSOR ) 50 MG tablet Take 50 mg by mouth every morning.    omeprazole (PRILOSEC) 40 MG capsule TAKE 1 CAPSULE BY MOUTH EVERY DAY IN THE MORNING 1 HOUR BEFORE BREAKFAST AS NEEDED FOR ACID REFLUX   ORENCIA 250 MG injection Inject 1 mL into the vein as directed.   oxyCODONE -acetaminophen  (PERCOCET) 7.5-325 MG tablet Take 1 tablet by mouth every 6 (six) hours as needed.   OXYGEN Inhale into the lungs.   predniSONE  (DELTASONE ) 5 MG tablet Take 5 mg by mouth daily.   roflumilast  (DALIRESP ) 500 MCG TABS tablet Take 500 mcg by mouth every morning.    rosuvastatin  (CRESTOR ) 40 MG tablet Take 1 tablet (40 mg total) by mouth daily.   tiZANidine (ZANAFLEX) 4 MG tablet Take 4 mg by mouth 2 (two) times daily. (Patient taking  differently: Take 4 mg by mouth as needed.)   TRELEGY ELLIPTA 100-62.5-25 MCG/INH AEPB Inhale into the lungs daily.   XARELTO  20 MG TABS tablet TAKE 1 TABLET BY MOUTH EVERY DAY   [DISCONTINUED] predniSONE  (DELTASONE ) 20 MG tablet Take 40 mg daily for 5 days   [DISCONTINUED] tirzepatide  (MOUNJARO ) 7.5 MG/0.5ML Pen Inject 7.5 mg into the skin once a week.   No facility-administered medications prior to visit.    Review of Systems  Constitutional: Negative.   HENT: Negative.    Eyes: Negative.   Respiratory: Negative.  Negative for shortness of breath.   Cardiovascular: Negative.  Negative for chest pain.  Gastrointestinal: Negative.  Negative for abdominal pain, constipation and diarrhea.  Genitourinary: Negative.   Musculoskeletal:  Positive for joint pain. Negative for myalgias.  Skin: Negative.   Neurological: Negative.  Negative for dizziness and headaches.  Endo/Heme/Allergies: Negative.   All other systems reviewed and are negative.      Objective:   BP 130/60   Pulse (!) 110   Ht 5' 2 (1.575 m)   Wt (!) 301 lb (136.5 kg)   LMP 04/24/2013 Comment: LMP >63YRS AGO  SpO2 96%   BMI 55.05 kg/m   Vitals:   12/12/23 1007  BP: 130/60  Pulse: (!) 110  Height: 5' 2 (1.575 m)  Weight: (!) 301 lb (136.5 kg)  SpO2: 96%  BMI (Calculated): 55.04    Physical Exam Vitals and nursing note reviewed.  Constitutional:      Appearance: Normal appearance. She is normal weight.  HENT:     Head: Normocephalic and atraumatic.     Nose: Nose normal.     Mouth/Throat:     Mouth: Mucous membranes are moist.  Eyes:     Extraocular Movements: Extraocular movements intact.     Conjunctiva/sclera: Conjunctivae normal.     Pupils: Pupils are equal, round, and reactive to light.  Cardiovascular:     Rate and Rhythm: Normal rate and regular rhythm.     Pulses: Normal pulses.  Heart sounds: Normal heart sounds.  Pulmonary:     Effort: Pulmonary effort is normal.     Breath  sounds: Normal breath sounds.  Abdominal:     General: Abdomen is flat. Bowel sounds are normal.     Palpations: Abdomen is soft.  Musculoskeletal:        General: Normal range of motion.     Cervical back: Normal range of motion.  Skin:    General: Skin is warm and dry.  Neurological:     General: No focal deficit present.     Mental Status: She is alert and oriented to person, place, and time.  Psychiatric:        Mood and Affect: Mood normal.        Behavior: Behavior normal.        Thought Content: Thought content normal.        Judgment: Judgment normal.      No results found for any visits on 12/12/23.  No results found for this or any previous visit (from the past 2160 hours).    Assessment & Plan:  Referral sent to PT Prednisone  Flu vaccine today Mounjaro  10 mg weekly  Problem List Items Addressed This Visit       Other   Left hip pain - Primary   Relevant Orders   Ambulatory referral to Physical Therapy   Inguinal pain   Other Visit Diagnoses       Flu vaccine need       Relevant Orders   Influenza, MDCK, trivalent, PF(Flucelvax egg-free) (Completed)       Return if symptoms worsen or fail to improve, for as scheduled.   Total time spent: 25 minutes  [employer], NP  12/12/2023   This document may have been prepared by Dragon Voice Recognition software and as such may include unintentional dictation errors.

## 2023-12-25 ENCOUNTER — Other Ambulatory Visit: Payer: Self-pay | Admitting: Orthopedic Surgery

## 2024-01-03 ENCOUNTER — Other Ambulatory Visit: Payer: Self-pay | Admitting: Internal Medicine

## 2024-01-03 ENCOUNTER — Other Ambulatory Visit

## 2024-01-03 DIAGNOSIS — I10 Essential (primary) hypertension: Secondary | ICD-10-CM

## 2024-01-03 DIAGNOSIS — E1165 Type 2 diabetes mellitus with hyperglycemia: Secondary | ICD-10-CM

## 2024-01-03 DIAGNOSIS — Z6841 Body Mass Index (BMI) 40.0 and over, adult: Secondary | ICD-10-CM

## 2024-01-04 ENCOUNTER — Ambulatory Visit: Payer: Self-pay | Admitting: Cardiology

## 2024-01-04 LAB — CMP14+EGFR
ALT: 18 IU/L (ref 0–32)
AST: 17 IU/L (ref 0–40)
Albumin: 4.1 g/dL (ref 3.9–4.9)
Alkaline Phosphatase: 114 IU/L (ref 49–135)
BUN/Creatinine Ratio: 22 (ref 12–28)
BUN: 11 mg/dL (ref 8–27)
Bilirubin Total: 0.4 mg/dL (ref 0.0–1.2)
CO2: 33 mmol/L — ABNORMAL HIGH (ref 20–29)
Calcium: 10.1 mg/dL (ref 8.7–10.3)
Chloride: 90 mmol/L — ABNORMAL LOW (ref 96–106)
Creatinine, Ser: 0.51 mg/dL — ABNORMAL LOW (ref 0.57–1.00)
Globulin, Total: 2.8 g/dL (ref 1.5–4.5)
Glucose: 111 mg/dL — ABNORMAL HIGH (ref 70–99)
Potassium: 3.4 mmol/L — ABNORMAL LOW (ref 3.5–5.2)
Sodium: 140 mmol/L (ref 134–144)
Total Protein: 6.9 g/dL (ref 6.0–8.5)
eGFR: 106 mL/min/1.73 (ref >59)

## 2024-01-04 LAB — LIPID PANEL
Chol/HDL Ratio: 2.1 ratio (ref 0.0–4.4)
Cholesterol, Total: 185 mg/dL (ref 100–199)
HDL: 87 mg/dL (ref >39)
LDL Chol Calc (NIH): 83 mg/dL (ref 0–99)
Triglycerides: 80 mg/dL (ref 0–149)
VLDL Cholesterol Cal: 15 mg/dL (ref 5–40)

## 2024-01-04 LAB — HEMOGLOBIN A1C
Est. average glucose Bld gHb Est-mCnc: 134 mg/dL
Hgb A1c MFr Bld: 6.3 % — ABNORMAL HIGH (ref 4.8–5.6)

## 2024-01-04 LAB — TSH: TSH: 0.582 u[IU]/mL (ref 0.450–4.500)

## 2024-01-07 ENCOUNTER — Encounter: Payer: Self-pay | Admitting: Cardiology

## 2024-01-07 ENCOUNTER — Ambulatory Visit: Admitting: Cardiology

## 2024-01-07 VITALS — BP 120/70 | HR 101 | Ht 62.0 in | Wt 294.2 lb

## 2024-01-07 DIAGNOSIS — E782 Mixed hyperlipidemia: Secondary | ICD-10-CM | POA: Diagnosis not present

## 2024-01-07 DIAGNOSIS — E1169 Type 2 diabetes mellitus with other specified complication: Secondary | ICD-10-CM | POA: Insufficient documentation

## 2024-01-07 DIAGNOSIS — I152 Hypertension secondary to endocrine disorders: Secondary | ICD-10-CM

## 2024-01-07 DIAGNOSIS — Z6841 Body Mass Index (BMI) 40.0 and over, adult: Secondary | ICD-10-CM

## 2024-01-07 DIAGNOSIS — Z1231 Encounter for screening mammogram for malignant neoplasm of breast: Secondary | ICD-10-CM

## 2024-01-07 DIAGNOSIS — E1165 Type 2 diabetes mellitus with hyperglycemia: Secondary | ICD-10-CM

## 2024-01-07 DIAGNOSIS — E1159 Type 2 diabetes mellitus with other circulatory complications: Secondary | ICD-10-CM | POA: Diagnosis not present

## 2024-01-07 DIAGNOSIS — J449 Chronic obstructive pulmonary disease, unspecified: Secondary | ICD-10-CM

## 2024-01-07 DIAGNOSIS — E66813 Obesity, class 3: Secondary | ICD-10-CM

## 2024-01-07 DIAGNOSIS — Z7951 Long term (current) use of inhaled steroids: Secondary | ICD-10-CM

## 2024-01-07 DIAGNOSIS — K219 Gastro-esophageal reflux disease without esophagitis: Secondary | ICD-10-CM

## 2024-01-07 MED ORDER — OMEPRAZOLE 40 MG PO CPDR
40.0000 mg | DELAYED_RELEASE_CAPSULE | Freq: Every day | ORAL | 1 refills | Status: AC
Start: 2024-01-07 — End: ?

## 2024-01-07 MED ORDER — MOUNJARO 12.5 MG/0.5ML ~~LOC~~ SOAJ: 12.5000 mg | SUBCUTANEOUS | 8 refills | Status: DC

## 2024-01-07 NOTE — Patient Instructions (Signed)
 [facility] [address], [address] [place],  [place]  [address]  Main: [phone]

## 2024-01-07 NOTE — Progress Notes (Signed)
 Established Patient Office Visit  Subjective:  Patient ID: Patty Leon, female    DOB: 06-Nov-1960  Age: 63 y.o. MRN: [id number]  Chief Complaint  Patient presents with   Results    4 month lab results    Patient in office for 4 month follow up, discuss recent lab results. Patient doing well, no new complaints.  Patient scheduled for shoulder surgery 01/22/24. Continues to have hip and groin pain, has started physical therapy.  Patient due for pap smear, will schedule with next visit. Patient due for mammogram next month, will send order now.  Discussed recent lab results. LDL higher than previous labs, continue rosuvastatin  40 mg daily. Decrease fatty food intake, eat more lean meats and vegetables. Hgb A1c controlled, continue current diabetic medications. Patient interested in increasing Mounjaro , will increase to 12.5 mg weekly. Potassium level slightly decreased, patient reports taking potassium supplement every other day, recommend increasing to daily.  Patient on 3-4 L O2 for severe COPD, follows with pulmonary.     No other concerns at this time.   Past Medical History:  Diagnosis Date   Acute exacerbation of CHF (congestive heart failure) (HCC) 10/17/2020   Anxiety    Arthritis    COPD (chronic obstructive pulmonary disease) (HCC)    Diabetes mellitus without complication (HCC)    Diverticulosis    Fluid retention    GERD (gastroesophageal reflux disease)    History of blood clots    Hypercholesteremia    Hypertension    Obesity    Obesity, Class III, BMI 40-49.9 (morbid obesity) (HCC) 07/24/2020   Panic attacks    Shortness of breath dyspnea    Sleep apnea    Use C-PAP with oxygen    Past Surgical History:  Procedure Laterality Date   BILATERAL CARPAL TUNNEL RELEASE     COLONOSCOPY     COLONOSCOPY N/A 01/22/2015   Procedure: COLONOSCOPY;  Surgeon: Donnice Vaughn Manes, MD;  Location: [facility] ENDOSCOPY;  Service: Endoscopy;  Laterality: N/A;   COLONOSCOPY N/A  08/24/2021   Procedure: COLONOSCOPY;  Surgeon: Toledo, Ladell POUR, MD;  Location: [facility] ENDOSCOPY;  Service: Gastroenterology;  Laterality: N/A;  DM   COLONOSCOPY WITH PROPOFOL  N/A 07/12/2016   Procedure: COLONOSCOPY WITH PROPOFOL ;  Surgeon: Lamar ONEIDA Holmes, MD;  Location: [facility] ENDOSCOPY;  Service: Endoscopy;  Laterality: N/A;   COLONOSCOPY WITH PROPOFOL  N/A 04/26/2018   Procedure: COLONOSCOPY WITH PROPOFOL ;  Surgeon: Holmes Lamar ONEIDA, MD;  Location: [facility] ENDOSCOPY;  Service: Endoscopy;  Laterality: N/A;   ESOPHAGOGASTRODUODENOSCOPY (EGD) WITH PROPOFOL  N/A 04/26/2018   Procedure: ESOPHAGOGASTRODUODENOSCOPY (EGD) WITH PROPOFOL ;  Surgeon: Holmes Lamar ONEIDA, MD;  Location: [facility] ENDOSCOPY;  Service: Endoscopy;  Laterality: N/A;   JOINT REPLACEMENT Left 09/2015   KNEE ARTHROSCOPY Left    KNEE CLOSED REDUCTION Left 11/04/2015   Procedure: CLOSED MANIPULATION KNEE;  Surgeon: Ozell Flake, MD;  Location: [facility] ORS;  Service: Orthopedics;  Laterality: Left;   KNEE CLOSED REDUCTION Left 04/04/2016   Procedure: CLOSED MANIPULATION KNEE;  Surgeon: Ozell Flake, MD;  Location: [facility] ORS;  Service: Orthopedics;  Laterality: Left;   SHOULDER ARTHROSCOPY W/ ROTATOR CUFF REPAIR Right    TOTAL KNEE ARTHROPLASTY Left 10/07/2015   Procedure: TOTAL KNEE ARTHROPLASTY;  Surgeon: Ozell Flake, MD;  Location: [facility] ORS;  Service: Orthopedics;  Laterality: Left;   TOTAL KNEE REVISION Left 02/08/2016   Procedure: TOTAL KNEE REVISION;  Surgeon: Ozell Flake, MD;  Location: [facility] ORS;  Service: Orthopedics;  Laterality: Left;   TUBAL LIGATION  Social History   Socioeconomic History   Marital status: Married    Spouse name: Not on file   Number of children: Not on file   Years of education: Not on file   Highest education level: Not on file  Occupational History   Not on file  Tobacco Use   Smoking status: Former    Current packs/day: 0.50    Average packs/day: 0.5 packs/day for 30.0 years (15.0 ttl pk-yrs)    Types:  Cigarettes   Smokeless tobacco: Never  Vaping Use   Vaping status: Never Used  Substance and Sexual Activity   Alcohol use: Not Currently   Drug use: Not Currently    Types: Oxycodone     Comment: as prescribed by MD   Sexual activity: Yes  Other Topics Concern   Not on file  Social History Narrative   Not on file   Social Drivers of Health   Financial Resource Strain: Low Risk  (12/05/2023)   Received from [facility]   Overall Financial Resource Strain (CARDIA)    Difficulty of Paying Living Expenses: Not hard at all  Food Insecurity: No Food Insecurity (12/05/2023)   Received from [facility]   Hunger Vital Sign    Within the past 12 months, you worried that your food would run out before you got the money to buy more.: Never true    Within the past 12 months, the food you bought just didn't last and you didn't have money to get more.: Never true  Transportation Needs: No Transportation Needs (12/05/2023)   Received from [facility]    In the past 12 months, has lack of transportation kept you from medical appointments or from getting medications?: No    Lack of Transportation (Non-Medical): No  Physical Activity: Not on file  Stress: Not on file  Social Connections: Unknown (07/26/2021)   Received from [facility]   Social Network    Social Network: Not on file  Intimate Partner Violence: Unknown (06/16/2021)   Received from [facility]   HITS    Physically Hurt: Not on file    Insult or Talk Down To: Not on file    Threaten Physical Harm: Not on file    Scream or Curse: Not on file    Family History  Problem Relation Age of Onset   Lung cancer Mother    Ovarian cancer Paternal Grandmother    Breast cancer Neg Hx     Allergies  Allergen Reactions   Celebrex [Celecoxib] Swelling   Dilaudid  [Hydromorphone ] Itching   Fentanyl  Other (See Comments)    Hallucinations.      Outpatient Medications Prior to Visit  Medication Sig   ACCU-CHEK GUIDE TEST test strip CHECK SUGAR DAILY IN AM FASTING DX CODE E11.65   Accu-Chek Softclix Lancets lancets Use as instructed   albuterol  (VENTOLIN  HFA) 108 (90 Base) MCG/ACT inhaler Inhale 2 puffs into the lungs every 6 (six) hours as needed for wheezing.   cetirizine  (ZYRTEC ) 10 MG tablet TAKE 1 TABLET BY MOUTH EVERY DAY   Cholecalciferol  (VITAMIN D ) 2000 units CAPS Take 2,000 Units by mouth daily.   docusate sodium  (COLACE) 100 MG capsule TAKE 1 CAPSULE BY MOUTH EVERY DAY   empagliflozin  (JARDIANCE ) 25 MG TABS tablet Take 1 tablet (25 mg total) by mouth daily.   fluticasone  (FLONASE ) 50 MCG/ACT nasal spray USE AS DIRECTED 1 SPRAY PER NARES DAILY   folic acid (  FOLVITE) 1 MG tablet Take 1 mg by mouth daily.   furosemide  (LASIX ) 20 MG tablet Take 1 tablet (20 mg total) by mouth as needed.   hydrochlorothiazide  (HYDRODIURIL ) 25 MG tablet Take 25 mg by mouth daily.   hydroxychloroquine (PLAQUENIL) 200 MG tablet Take 200 mg by mouth 2 (two) times daily.   KLOR-CON  M10 10 MEQ tablet Take 10 mEq by mouth daily.   LINZESS  290 MCG CAPS capsule TAKE 1 CAPSULE BY MOUTH DAILY BEFORE BREAKFAST. (Patient taking differently: Take 290 mcg by mouth as needed.)   losartan  (COZAAR ) 25 MG tablet TAKE 1 TABLET (25 MG TOTAL) BY MOUTH DAILY.   metFORMIN  (GLUCOPHAGE ) 500 MG tablet TAKE 1 TABLET BY MOUTH TWICE A DAY   methotrexate (RHEUMATREX) 2.5 MG tablet Take 12.5 mg by mouth once a week.   metoprolol  (LOPRESSOR ) 50 MG tablet Take 50 mg by mouth every morning.    ORENCIA 250 MG injection Inject 1 mL into the vein as directed.   oxyCODONE -acetaminophen  (PERCOCET) 7.5-325 MG tablet Take 1 tablet by mouth every 6 (six) hours as needed.   OXYGEN Inhale into the lungs.   predniSONE  (DELTASONE ) 20 MG tablet Take 40 mg daily for 5 days   predniSONE  (DELTASONE ) 5 MG tablet Take 5 mg by mouth daily.   roflumilast  (DALIRESP ) 500 MCG TABS tablet Take  500 mcg by mouth every morning.    rosuvastatin  (CRESTOR ) 40 MG tablet Take 1 tablet (40 mg total) by mouth daily.   tiZANidine (ZANAFLEX) 4 MG tablet Take 4 mg by mouth 2 (two) times daily. (Patient taking differently: Take 4 mg by mouth as needed.)   TRELEGY ELLIPTA 100-62.5-25 MCG/INH AEPB Inhale into the lungs daily.   XARELTO  20 MG TABS tablet TAKE 1 TABLET BY MOUTH EVERY DAY   [DISCONTINUED] omeprazole (PRILOSEC) 40 MG capsule TAKE 1 CAPSULE BY MOUTH EVERY DAY IN THE MORNING 1 HOUR BEFORE BREAKFAST AS NEEDED FOR ACID REFLUX   [DISCONTINUED] tirzepatide  (MOUNJARO ) 10 MG/0.5ML Pen Inject 10 mg into the skin once a week.   No facility-administered medications prior to visit.    ROS     Objective:   BP 120/70   Pulse (!) 101   Ht 5' 2 (1.575 m)   Wt 294 lb 3.2 oz (133.4 kg)   LMP 04/24/2013 Comment: LMP >73YRS AGO  SpO2 97%   BMI 53.81 kg/m   Vitals:   01/07/24 0849  BP: 120/70  Pulse: (!) 101  Height: 5' 2 (1.575 m)  Weight: 294 lb 3.2 oz (133.4 kg)  SpO2: 97%  BMI (Calculated): 53.8    Physical Exam   No results found for any visits on 01/07/24.  Recent Results (from the past 2160 hours)  Hemoglobin A1c     Status: Abnormal   Collection Time: 01/03/24  8:12 AM  Result Value Ref Range   Hgb A1c MFr Bld 6.3 (H) 4.8 - 5.6 %    Comment:          Prediabetes: 5.7 - 6.4          Diabetes: >6.4          Glycemic control for adults with diabetes: <7.0    Est. average glucose Bld gHb Est-mCnc 134 mg/dL  TSH     Status: None   Collection Time: 01/03/24  8:12 AM  Result Value Ref Range   TSH 0.582 0.450 - 4.500 uIU/mL  CMP14+EGFR     Status: Abnormal   Collection Time: 01/03/24  8:12 AM  Result Value Ref Range   Glucose 111 (H) 70 - 99 mg/dL   BUN 11 8 - 27 mg/dL   Creatinine, Ser 9.48 (L) 0.57 - 1.00 mg/dL   eGFR 893 >40 fO/fpw/8.26   BUN/Creatinine Ratio 22 12 - 28   Sodium 140 134 - 144 mmol/L   Potassium 3.4 (L) 3.5 - 5.2 mmol/L   Chloride 90 (L) 96 -  106 mmol/L   CO2 33 (H) 20 - 29 mmol/L   Calcium  10.1 8.7 - 10.3 mg/dL   Total Protein 6.9 6.0 - 8.5 g/dL   Albumin 4.1 3.9 - 4.9 g/dL   Globulin, Total 2.8 1.5 - 4.5 g/dL   Bilirubin Total 0.4 0.0 - 1.2 mg/dL   Alkaline Phosphatase 114 49 - 135 IU/L   AST 17 0 - 40 IU/L   ALT 18 0 - 32 IU/L  Lipid panel     Status: None   Collection Time: 01/03/24  8:12 AM  Result Value Ref Range   Cholesterol, Total 185 100 - 199 mg/dL   Triglycerides 80 0 - 149 mg/dL   HDL 87 >60 mg/dL   VLDL Cholesterol Cal 15 5 - 40 mg/dL   LDL Chol Calc (NIH) 83 0 - 99 mg/dL   Chol/HDL Ratio 2.1 0.0 - 4.4 ratio    Comment:                                   T. Chol/HDL Ratio                                             Men  Women                               1/2 Avg.Risk  3.4    3.3                                   Avg.Risk  5.0    4.4                                2X Avg.Risk  9.6    7.1                                3X Avg.Risk 23.4   11.0       Assessment & Plan:  Continue physical therapy Schedule pap smear Mammogram order sent Decrease fatty food intake Increase Mounjaro  to 12.5 mg weekly Increase potassium to daily Follow up with specialists as scheduled  Problem List Items Addressed This Visit       Cardiovascular and Mediastinum   Hypertension associated with diabetes (HCC) - Primary   Relevant Medications   tirzepatide  (MOUNJARO ) 12.5 MG/0.5ML Pen     Respiratory   COPD on long-term inhaled steroid therapy (HCC)     Digestive   Gastro-esophageal reflux disease without esophagitis   Relevant Medications   omeprazole (PRILOSEC) 40 MG capsule     Endocrine   Uncontrolled type 2 diabetes mellitus with hyperglycemia, without long-term current use of insulin  (HCC)  Relevant Medications   tirzepatide  (MOUNJARO ) 12.5 MG/0.5ML Pen   Combined hyperlipidemia associated with type 2 diabetes mellitus (HCC)   Relevant Medications   tirzepatide  (MOUNJARO ) 12.5 MG/0.5ML Pen     Other    Class 3 severe obesity due to excess calories without serious comorbidity with body mass index (BMI) of 50.0 to 59.9 in adult [facility])   Relevant Medications   tirzepatide  (MOUNJARO ) 12.5 MG/0.5ML Pen   Other Visit Diagnoses       Breast cancer screening by mammogram       Relevant Orders   MM 3D SCREENING MAMMOGRAM BILATERAL BREAST       Return in about 4 months (around 05/09/2024) for fasting labs prior, with Alan for pap.   Total time spent: 25 minutes. This time includes review of previous notes and results and patient face to face interaction during today's visit.    Jeoffrey Pollen, NP  01/07/2024   This document may have been prepared by Dragon Voice Recognition software and as such may include unintentional dictation errors.

## 2024-01-09 ENCOUNTER — Ambulatory Visit
Admission: RE | Admit: 2024-01-09 | Discharge: 2024-01-09 | Disposition: A | Discharge status: Home / Self Care | Source: Ambulatory Visit | Attending: Cardiology | Admitting: Cardiology

## 2024-01-09 DIAGNOSIS — Z1231 Encounter for screening mammogram for malignant neoplasm of breast: Secondary | ICD-10-CM | POA: Insufficient documentation

## 2024-01-11 ENCOUNTER — Other Ambulatory Visit

## 2024-01-14 ENCOUNTER — Ambulatory Visit: Payer: Self-pay | Admitting: Cardiology

## 2024-01-17 ENCOUNTER — Encounter: Payer: Self-pay | Admitting: Cardiology

## 2024-01-22 ENCOUNTER — Encounter: Admission: RE | Payer: Self-pay | Source: Home / Self Care

## 2024-01-22 ENCOUNTER — Ambulatory Visit: Admission: RE | Admit: 2024-01-22 | Source: Home / Self Care | Admitting: Orthopedic Surgery

## 2024-01-22 SURGERY — REPAIR, ROTATOR CUFF, OPEN
Anesthesia: Choice | Site: Shoulder | Laterality: Right

## 2024-02-04 ENCOUNTER — Other Ambulatory Visit: Payer: Self-pay | Admitting: Cardiology

## 2024-04-01 ENCOUNTER — Other Ambulatory Visit: Payer: Self-pay

## 2024-04-01 DIAGNOSIS — E119 Type 2 diabetes mellitus without complications: Secondary | ICD-10-CM

## 2024-04-01 MED ORDER — METFORMIN HCL 500 MG PO TABS: 500.0000 mg | ORAL_TABLET | Freq: Two times a day (BID) | ORAL | 0 refills | Status: AC

## 2024-04-08 ENCOUNTER — Emergency Department

## 2024-04-08 ENCOUNTER — Emergency Department
Admission: EM | Admit: 2024-04-08 | Discharge: 2024-04-08 | Disposition: A | Discharge status: Home / Self Care | Attending: Emergency Medicine | Admitting: Emergency Medicine

## 2024-04-08 ENCOUNTER — Other Ambulatory Visit: Payer: Self-pay

## 2024-04-08 DIAGNOSIS — I110 Hypertensive heart disease with heart failure: Secondary | ICD-10-CM | POA: Diagnosis not present

## 2024-04-08 DIAGNOSIS — E119 Type 2 diabetes mellitus without complications: Secondary | ICD-10-CM | POA: Diagnosis not present

## 2024-04-08 DIAGNOSIS — I509 Heart failure, unspecified: Secondary | ICD-10-CM | POA: Diagnosis not present

## 2024-04-08 DIAGNOSIS — J449 Chronic obstructive pulmonary disease, unspecified: Secondary | ICD-10-CM | POA: Diagnosis not present

## 2024-04-08 DIAGNOSIS — R051 Acute cough: Secondary | ICD-10-CM | POA: Diagnosis not present

## 2024-04-08 DIAGNOSIS — R059 Cough, unspecified: Secondary | ICD-10-CM | POA: Diagnosis present

## 2024-04-08 LAB — COMPREHENSIVE METABOLIC PANEL WITH GFR
ALT: 18 U/L (ref 0–44)
AST: 17 U/L (ref 15–41)
Albumin: 4.1 g/dL (ref 3.5–5.0)
Alkaline Phosphatase: 128 U/L — ABNORMAL HIGH (ref 38–126)
Anion gap: 10 (ref 5–15)
BUN: 6 mg/dL — ABNORMAL LOW (ref 8–23)
CO2: 37 mmol/L — ABNORMAL HIGH (ref 22–32)
Calcium: 10.7 mg/dL — ABNORMAL HIGH (ref 8.9–10.3)
Chloride: 92 mmol/L — ABNORMAL LOW (ref 98–111)
Creatinine, Ser: 0.48 mg/dL (ref 0.44–1.00)
GFR, Estimated: >60 mL/min
Glucose, Bld: 101 mg/dL — ABNORMAL HIGH (ref 70–99)
Potassium: 3.2 mmol/L — ABNORMAL LOW (ref 3.5–5.1)
Sodium: 140 mmol/L (ref 135–145)
Total Bilirubin: 0.4 mg/dL (ref 0.0–1.2)
Total Protein: 7.9 g/dL (ref 6.5–8.1)

## 2024-04-08 LAB — CBC WITH DIFFERENTIAL/PLATELET
Abs Immature Granulocytes: 0.02 10*3/uL (ref 0.00–0.07)
Basophils Absolute: 0 10*3/uL (ref 0.0–0.1)
Basophils Relative: 0 %
Eosinophils Absolute: 0.1 10*3/uL (ref 0.0–0.5)
Eosinophils Relative: 1 %
HCT: 38.3 % (ref 36.0–46.0)
Hemoglobin: 12.8 g/dL (ref 12.0–15.0)
Immature Granulocytes: 0 %
Lymphocytes Relative: 16 %
Lymphs Abs: 1.4 10*3/uL (ref 0.7–4.0)
MCH: 30.6 pg (ref 26.0–34.0)
MCHC: 33.4 g/dL (ref 30.0–36.0)
MCV: 91.6 fL (ref 80.0–100.0)
Monocytes Absolute: 0.9 10*3/uL (ref 0.1–1.0)
Monocytes Relative: 10 %
Neutro Abs: 6.4 10*3/uL (ref 1.7–7.7)
Neutrophils Relative %: 73 %
Platelets: 341 10*3/uL (ref 150–400)
RBC: 4.18 MIL/uL (ref 3.87–5.11)
RDW: 13.2 % (ref 11.5–15.5)
WBC: 8.9 10*3/uL (ref 4.0–10.5)
nRBC: 0 % (ref 0.0–0.2)

## 2024-04-08 LAB — URINALYSIS, W/ REFLEX TO CULTURE (INFECTION SUSPECTED)
Bacteria, UA: NONE SEEN
Bilirubin Urine: NEGATIVE
Glucose, UA: >=500 mg/dL — AB
Ketones, ur: NEGATIVE mg/dL
Leukocytes,Ua: NEGATIVE
Nitrite: NEGATIVE
Protein, ur: NEGATIVE mg/dL
Specific Gravity, Urine: 1.015 (ref 1.005–1.030)
pH: 5 (ref 5.0–8.0)

## 2024-04-08 LAB — LACTIC ACID, PLASMA: Lactic Acid, Venous: 1.1 mmol/L (ref 0.5–1.9)

## 2024-04-08 LAB — PRO BRAIN NATRIURETIC PEPTIDE: Pro Brain Natriuretic Peptide: 102 pg/mL

## 2024-04-08 LAB — RESP PANEL BY RT-PCR (RSV, FLU A&B, COVID)  RVPGX2
Influenza A by PCR: NEGATIVE
Influenza B by PCR: NEGATIVE
Resp Syncytial Virus by PCR: NEGATIVE
SARS Coronavirus 2 by RT PCR: NEGATIVE

## 2024-04-08 MED ORDER — PREDNISONE 20 MG PO TABS: 40.0000 mg | ORAL_TABLET | Freq: Every day | ORAL | 0 refills | Status: AC

## 2024-04-08 MED ORDER — HYDROCOD POLI-CHLORPHE POLI ER 10-8 MG/5ML PO SUER: 5.0000 mL | Freq: Two times a day (BID) | ORAL | 0 refills | Status: AC

## 2024-04-08 MED ORDER — ACETAMINOPHEN 325 MG PO TABS
650.0000 mg | ORAL_TABLET | Freq: Once | ORAL | Status: AC
  Administered 2024-04-08: 650 mg via ORAL
  Filled 2024-04-08: qty 2

## 2024-04-08 MED ORDER — IPRATROPIUM-ALBUTEROL 0.5-2.5 (3) MG/3ML IN SOLN
3.0000 mL | Freq: Once | RESPIRATORY_TRACT | Status: AC
  Administered 2024-04-08: 3 mL via RESPIRATORY_TRACT
  Filled 2024-04-08: qty 3

## 2024-04-08 MED ORDER — BENZONATATE 100 MG PO CAPS: 200.0000 mg | ORAL_CAPSULE | Freq: Three times a day (TID) | ORAL | 0 refills | Status: AC | PRN

## 2024-04-08 MED ORDER — PREDNISONE 20 MG PO TABS
60.0000 mg | ORAL_TABLET | Freq: Once | ORAL | Status: AC
  Administered 2024-04-08: 60 mg via ORAL
  Filled 2024-04-08: qty 3

## 2024-04-08 NOTE — ED Provider Notes (Signed)
 "   [facility] Emergency Department Provider Note     Event Date/Time   First MD Initiated Contact with Patient 04/08/24 1054     (approximate)   History   Cough   HPI  Patty Leon is a 64 y.o. female with a history of COPD, HTN, OSA, DM type II, GERD, anxiety, and CHF, who presents to the ED endorsing cough for the last 3 to 4 days.  She reports productive cough, with green-colored sputum. She denies FCS, NVD, or sick contacts. Patient wears 3 L of O2 per nasal cannula at baseline.  She denies any fever, chills, or sweats.  Physical Exam   Triage Vital Signs: ED Triage Vitals  Encounter Vitals Group     BP 04/08/24 1003 111/88     Girls Systolic BP Percentile --      Girls Diastolic BP Percentile --      Boys Systolic BP Percentile --      Boys Diastolic BP Percentile --      Pulse Rate 04/08/24 1003 (!) 108     Resp 04/08/24 1003 19     Temp 04/08/24 1003 98.9 F (37.2 C)     Temp Source 04/08/24 1003 Oral     SpO2 04/08/24 1003 94 %     Weight 04/08/24 1006 300 lb (136.1 kg)     Height 04/08/24 1006 5' 2 (1.575 m)     Head Circumference --      Peak Flow --      Pain Score 04/08/24 1006 8     Pain Loc --      Pain Education --      Exclude from Growth Chart --     Most recent vital signs: Vitals:   04/08/24 1003  BP: 111/88  Pulse: (!) 108  Resp: 19  Temp: 98.9 F (37.2 C)  SpO2: 94%    General Awake, no distress. NAD HEENT NCAT. PERRL. EOMI. No rhinorrhea. Mucous membranes are moist.  CV:  Good peripheral perfusion. RRR RESP:  Normal effort. CTA ABD:  No distention.    ED Results / Procedures / Treatments   Labs (all labs ordered are listed, but only abnormal results are displayed) Labs Reviewed  COMPREHENSIVE METABOLIC PANEL WITH GFR - Abnormal; Notable for the following components:      Result Value   Potassium 3.2 (*)    Chloride 92 (*)    CO2 37 (*)    Glucose, Bld 101 (*)    BUN 6 (*)    Calcium  10.7 (*)     Alkaline Phosphatase 128 (*)    All other components within normal limits  URINALYSIS, W/ REFLEX TO CULTURE (INFECTION SUSPECTED) - Abnormal; Notable for the following components:   Color, Urine STRAW (*)    APPearance CLEAR (*)    Glucose, UA >=500 (*)    Hgb urine dipstick MODERATE (*)    All other components within normal limits  RESP PANEL BY RT-PCR (RSV, FLU A&B, COVID)  [id number]  LACTIC ACID, PLASMA  CBC WITH DIFFERENTIAL/PLATELET  PRO BRAIN NATRIURETIC PEPTIDE    EKG  Vent. rate 104 BPM  PR interval 154 ms  QRS duration 86 ms  QT/QTcB 330/433 ms  P-R-T axes 62 84 61  Sinus tachycardia  Otherwise normal ECG   RADIOLOGY  I personally viewed and evaluated these images as part of my medical decision making, as well as reviewing the written report by the radiologist.  ED Provider  Interpretation: no acute findings  DG Chest 2 View Result Date: 04/08/2024 EXAM: 2 VIEW(S) XRAY OF THE CHEST 04/08/2024 10:30:00 AM COMPARISON: Chest CT 09/12/2023 and earlier studies. CLINICAL HISTORY: 64 year old female with persistent cough. FINDINGS: LUNGS AND PLEURA: Coarse and chronic bilateral pulmonary interstitial opacity appears stable. Underlying chronic large lung volumes. No acute lung opacity. No pleural effusion. No pneumothorax. HEART AND MEDIASTINUM: No acute abnormality of the cardiac and mediastinal silhouettes. BONES AND SOFT TISSUES: Multilevel degenerative changes of thoracic spine. No acute osseous abnormality. IMPRESSION: 1. No acute cardiopulmonary abnormality. Chronic pulmonary interstitial changes. Electronically signed by: Helayne Hurst MD 04/08/2024 10:37 AM EST RP Workstation: [id number]    PROCEDURES:  Critical Care performed: No  Procedures   MEDICATIONS ORDERED IN ED: Medications  ipratropium-albuterol  (DUONEB) 0.5-2.5 (3) MG/3ML nebulizer solution 3 mL (3 mLs Nebulization Given 04/08/24 1242)  acetaminophen  (TYLENOL ) tablet 650 mg (650 mg Oral Given 04/08/24 1242)   predniSONE  (DELTASONE ) tablet 60 mg (60 mg Oral Given 04/08/24 1242)     IMPRESSION / MDM / ASSESSMENT AND PLAN / ED COURSE  I reviewed the triage vital signs and the nursing notes.                              Differential diagnosis includes, but is not limited to, viral syndrome, bronchitis including COPD exacerbation, pneumonia, reactive airway disease including asthma, CHF including exacerbation with or without pulmonary/interstitial edema, pneumothorax, ACS, thoracic trauma, and pulmonary embolism.   Patient's presentation is most consistent with acute complicated illness / injury requiring diagnostic workup.  Patient's diagnosis is consistent with acute cough.  Patient with a history of COPD, presents to the ED endorsing some productive cough without any reports of fever, chills, sweats.  Patient's workup is overall reassuring.  Chest x-ray without evidence of any acute infectious process.  Viral panel test is negative at this time.  No acute lab abnormalities are noted.  No evidence of sepsis based on presentation.  BMP is within normal limits, without evidence of acute fluid overload.  Patient's EKG is without evidence of arrhythmia.  Patient treated in the ED with DuoNeb, Tylenol , and prednisolone.  Patient will be discharged home with prescriptions for Tessalon  Perles, Tussidex syrup, prednisone . Patient is to follow up with her primary provider as suggested, as needed or otherwise directed. Patient is given ED precautions to return to the ED for any worsening or new symptoms.  FINAL CLINICAL IMPRESSION(S) / ED DIAGNOSES   Final diagnoses:  Acute cough     Rx / DC Orders   ED Discharge Orders          Ordered    benzonatate  (TESSALON ) 100 MG capsule  3 times daily PRN        04/08/24 1321    predniSONE  (DELTASONE ) 20 MG tablet  Daily with breakfast        04/08/24 1321    chlorpheniramine-HYDROcodone  (TUSSIONEX) 10-8 MG/5ML  2 times daily        04/08/24 1321              Note:  This document was prepared using Dragon voice recognition software and may include unintentional dictation errors.    Loyd Candida LULLA Aldona, PA-C 04/08/24 1525    Dorothyann Drivers, MD 04/08/24 1914  "

## 2024-04-08 NOTE — Discharge Instructions (Signed)
 Take the prescription meds as directed. Follow-up with your PCP as needed.

## 2024-04-08 NOTE — ED Triage Notes (Signed)
 Pt sts that she has been having cough since Sunday. Pt sts that she does have COPD. Pt is currently on home o2 with a 3l/min via [place] at baseline.

## 2024-04-10 ENCOUNTER — Encounter: Payer: Self-pay | Admitting: Cardiology

## 2024-04-10 ENCOUNTER — Ambulatory Visit (INDEPENDENT_AMBULATORY_CARE_PROVIDER_SITE_OTHER): Admitting: Cardiology

## 2024-04-10 VITALS — BP 135/70 | HR 116 | Ht 62.0 in | Wt 282.0 lb

## 2024-04-10 DIAGNOSIS — J449 Chronic obstructive pulmonary disease, unspecified: Secondary | ICD-10-CM

## 2024-04-10 DIAGNOSIS — E1165 Type 2 diabetes mellitus with hyperglycemia: Secondary | ICD-10-CM

## 2024-04-10 DIAGNOSIS — Z7951 Long term (current) use of inhaled steroids: Secondary | ICD-10-CM | POA: Diagnosis not present

## 2024-04-10 DIAGNOSIS — J209 Acute bronchitis, unspecified: Secondary | ICD-10-CM

## 2024-04-10 DIAGNOSIS — J9611 Chronic respiratory failure with hypoxia: Secondary | ICD-10-CM | POA: Diagnosis not present

## 2024-04-10 DIAGNOSIS — I10 Essential (primary) hypertension: Secondary | ICD-10-CM

## 2024-04-10 DIAGNOSIS — E66813 Obesity, class 3: Secondary | ICD-10-CM | POA: Diagnosis not present

## 2024-04-10 DIAGNOSIS — Z713 Dietary counseling and surveillance: Secondary | ICD-10-CM | POA: Diagnosis not present

## 2024-04-10 DIAGNOSIS — Z6841 Body Mass Index (BMI) 40.0 and over, adult: Secondary | ICD-10-CM

## 2024-04-10 MED ORDER — LEVOFLOXACIN 750 MG PO TABS: 750.0000 mg | ORAL_TABLET | Freq: Every day | ORAL | 0 refills | Status: AC

## 2024-04-10 MED ORDER — MOUNJARO 15 MG/0.5ML ~~LOC~~ SOAJ: 15.0000 mg | SUBCUTANEOUS | 4 refills | Status: AC

## 2024-04-10 NOTE — Progress Notes (Signed)
 "  Established Patient Office Visit  Subjective:  Patient ID: Patty Leon, female    DOB: [birth year]  Age: 64 y.o. MRN: [id number]  Chief Complaint  Patient presents with   Acute Visit    Follow-up from the ER symptoms still present  Coughing up yellow/green mucus that is consistent, Shortness of breath, and coughing spells  No fever     Patient in office for ED follow up. Patient went to ED on 03/14/24 with complaints of cough. Patient has history of COPD on 3 L of O2 via [place]. Xray at that time showed No acute cardiopulmonary abnormality, chronic pulmonary interstitial changes. Patient was given cough medicine and prednisone . Told to follow up with PCP if not feeling better or if symptoms worsen. Patient comes in today complaining of cough, unchanged. Will send in an antibiotic. Continue cough medicine, Mucinex , drink plenty of water.  Blood pressure controlled today.  Patient asking to increase Mounjaro . Will increase to 15 mg weekly.    No other concerns at this time.   Past Medical History:  Diagnosis Date   Acute exacerbation of CHF (congestive heart failure) (HCC) 10/17/2020   Anxiety    Arthritis    COPD (chronic obstructive pulmonary disease) (HCC)    Diabetes mellitus without complication (HCC)    Diverticulosis    Fluid retention    GERD (gastroesophageal reflux disease)    History of blood clots    Hypercholesteremia    Hypertension    Obesity    Obesity, Class III, BMI 40-49.9 (morbid obesity) (HCC) 07/24/2020   Panic attacks    Shortness of breath dyspnea    Sleep apnea    Use C-PAP with oxygen    Past Surgical History:  Procedure Laterality Date   BILATERAL CARPAL TUNNEL RELEASE     COLONOSCOPY     COLONOSCOPY N/A 01/22/2015   Procedure: COLONOSCOPY;  Surgeon: Donnice Vaughn Manes, MD;  Location: [facility] ENDOSCOPY;  Service: Endoscopy;  Laterality: N/A;   COLONOSCOPY N/A 08/24/2021   Procedure: COLONOSCOPY;  Surgeon: Toledo, Ladell POUR, MD;  Location: [facility]  ENDOSCOPY;  Service: Gastroenterology;  Laterality: N/A;  DM   COLONOSCOPY WITH PROPOFOL  N/A 07/12/2016   Procedure: COLONOSCOPY WITH PROPOFOL ;  Surgeon: Lamar ONEIDA Holmes, MD;  Location: [facility] ENDOSCOPY;  Service: Endoscopy;  Laterality: N/A;   COLONOSCOPY WITH PROPOFOL  N/A 04/26/2018   Procedure: COLONOSCOPY WITH PROPOFOL ;  Surgeon: Holmes Lamar ONEIDA, MD;  Location: [facility] ENDOSCOPY;  Service: Endoscopy;  Laterality: N/A;   ESOPHAGOGASTRODUODENOSCOPY (EGD) WITH PROPOFOL  N/A 04/26/2018   Procedure: ESOPHAGOGASTRODUODENOSCOPY (EGD) WITH PROPOFOL ;  Surgeon: Holmes Lamar ONEIDA, MD;  Location: [facility] ENDOSCOPY;  Service: Endoscopy;  Laterality: N/A;   JOINT REPLACEMENT Left 09/2015   KNEE ARTHROSCOPY Left    KNEE CLOSED REDUCTION Left 11/04/2015   Procedure: CLOSED MANIPULATION KNEE;  Surgeon: Ozell Flake, MD;  Location: [facility] ORS;  Service: Orthopedics;  Laterality: Left;   KNEE CLOSED REDUCTION Left 04/04/2016   Procedure: CLOSED MANIPULATION KNEE;  Surgeon: Ozell Flake, MD;  Location: [facility] ORS;  Service: Orthopedics;  Laterality: Left;   SHOULDER ARTHROSCOPY W/ ROTATOR CUFF REPAIR Right    TOTAL KNEE ARTHROPLASTY Left 10/07/2015   Procedure: TOTAL KNEE ARTHROPLASTY;  Surgeon: Ozell Flake, MD;  Location: [facility] ORS;  Service: Orthopedics;  Laterality: Left;   TOTAL KNEE REVISION Left 02/08/2016   Procedure: TOTAL KNEE REVISION;  Surgeon: Ozell Flake, MD;  Location: [facility] ORS;  Service: Orthopedics;  Laterality: Left;   TUBAL LIGATION      Social  History   Socioeconomic History   Marital status: Married    Spouse name: Not on file   Number of children: Not on file   Years of education: Not on file   Highest education level: Not on file  Occupational History   Not on file  Tobacco Use   Smoking status: Former    Current packs/day: 0.50    Average packs/day: 0.5 packs/day for 30.0 years (15.0 ttl pk-yrs)    Types: Cigarettes   Smokeless tobacco: Never  Vaping Use   Vaping status: Never Used   Substance and Sexual Activity   Alcohol use: Not Currently   Drug use: Not Currently    Types: Oxycodone     Comment: as prescribed by MD   Sexual activity: Yes  Other Topics Concern   Not on file  Social History Narrative   Not on file   Social Drivers of Health   Tobacco Use: Medium Risk (04/10/2024)   Patient History    Smoking Tobacco Use: Former    Smokeless Tobacco Use: Never    Passive Exposure: Not on Actuary Strain: Low Risk  (01/09/2024)   Received from [facility]   Overall Financial Resource Strain (CARDIA)    Difficulty of Paying Living Expenses: Not hard at all  Food Insecurity: No Food Insecurity (01/09/2024)   Received from [facility]   Epic    Within the past 12 months, you worried that your food would run out before you got the money to buy more.: Never true    Within the past 12 months, the food you bought just didn't last and you didn't have money to get more.: Never true  Transportation Needs: No Transportation Needs (01/09/2024)   Received from [facility] - Transportation    In the past 12 months, has lack of transportation kept you from medical appointments or from getting medications?: No    Lack of Transportation (Non-Medical): No  Physical Activity: Not on file  Stress: Not on file  Social Connections: Unknown (07/26/2021)   Received from [facility]   Social Network    Social Network: Not on file  Intimate Partner Violence: Unknown (06/16/2021)   Received from [facility]   HITS    Physically Hurt: Not on file    Insult or Talk Down To: Not on file    Threaten Physical Harm: Not on file    Scream or Curse: Not on file  Depression (EYV7-0): Not on file  Alcohol Screen: Not on file  Housing: Low Risk  (01/09/2024)   Received from [facility]   Epic    In the last 12 months, was there a time when you were not able to pay the mortgage or rent  on time?: No    In the past 12 months, how many times have you moved where you were living?: 0    At any time in the past 12 months, were you homeless or living in a shelter (including now)?: No  Utilities: Not At Risk (01/09/2024)   Received from [facility]   Epic    In the past 12 months has the electric, gas, oil, or water company threatened to shut off services in your home?: No  Health Literacy: Not on file    Family History  Problem Relation Age of Onset   Lung cancer Mother    Ovarian cancer Paternal Grandmother  Breast cancer Neg Hx     Allergies[1]  Show/hide medication list[2]  Review of Systems  Constitutional: Negative.   HENT: Negative.    Eyes: Negative.   Respiratory:  Positive for cough and shortness of breath.   Cardiovascular: Negative.  Negative for chest pain.  Gastrointestinal: Negative.  Negative for abdominal pain, constipation and diarrhea.  Genitourinary: Negative.   Musculoskeletal:  Negative for joint pain and myalgias.  Skin: Negative.   Neurological: Negative.  Negative for dizziness and headaches.  Endo/Heme/Allergies: Negative.   All other systems reviewed and are negative.      Objective:   BP 135/70   Pulse (!) 116   Ht 5' 2 (1.575 m)   Wt 282 lb (127.9 kg)   LMP 04/24/2013 Comment: LMP >9YRS AGO  SpO2 96%   BMI 51.58 kg/m   Vitals:   04/10/24 1453  BP: 135/70  Pulse: (!) 116  Height: 5' 2 (1.575 m)  Weight: 282 lb (127.9 kg)  SpO2: 96%  BMI (Calculated): 51.57    Physical Exam Vitals and nursing note reviewed.  Constitutional:      Appearance: Normal appearance. She is normal weight.  HENT:     Head: Normocephalic and atraumatic.     Nose: Nose normal.     Mouth/Throat:     Mouth: Mucous membranes are moist.  Eyes:     Extraocular Movements: Extraocular movements intact.     Conjunctiva/sclera: Conjunctivae normal.     Pupils: Pupils are equal, round, and reactive to light.   Cardiovascular:     Rate and Rhythm: Normal rate and regular rhythm.     Pulses: Normal pulses.     Heart sounds: Normal heart sounds.  Pulmonary:     Effort: Pulmonary effort is normal. No respiratory distress.     Breath sounds: Decreased air movement present. Decreased breath sounds present.  Abdominal:     General: Abdomen is flat. Bowel sounds are normal.     Palpations: Abdomen is soft.  Musculoskeletal:        General: Normal range of motion.     Cervical back: Normal range of motion.  Skin:    General: Skin is warm and dry.  Neurological:     General: No focal deficit present.     Mental Status: She is alert and oriented to person, place, and time.  Psychiatric:        Mood and Affect: Mood normal.        Behavior: Behavior normal.        Thought Content: Thought content normal.        Judgment: Judgment normal.      No results found for any visits on 04/10/24.  Recent Results (from the past 2160 hours)  Lactic acid, plasma     Status: None   Collection Time: 04/08/24 10:13 AM  Result Value Ref Range   Lactic Acid, Venous 1.1 0.5 - 1.9 mmol/L    Comment: Performed at [facility], [address] [place], [place] [address]  Comprehensive metabolic panel     Status: Abnormal   Collection Time: 04/08/24 10:13 AM  Result Value Ref Range   Sodium 140 135 - 145 mmol/L   Potassium 3.2 (L) 3.5 - 5.1 mmol/L   Chloride 92 (L) 98 - 111 mmol/L   CO2 37 (H) 22 - 32 mmol/L   Glucose, Bld 101 (H) 70 - 99 mg/dL    Comment: Glucose reference range applies only to samples taken after  fasting for at least 8 hours.   BUN 6 (L) 8 - 23 mg/dL   Creatinine, Ser 9.51 0.44 - 1.00 mg/dL   Calcium  10.7 (H) 8.9 - 10.3 mg/dL   Total Protein 7.9 6.5 - 8.1 g/dL   Albumin 4.1 3.5 - 5.0 g/dL   AST 17 15 - 41 U/L   ALT 18 0 - 44 U/L   Alkaline Phosphatase 128 (H) 38 - 126 U/L   Total Bilirubin 0.4 0.0 - 1.2 mg/dL   GFR, Estimated >39 >39 mL/min    Comment: (NOTE) Calculated  using the CKD-EPI Creatinine Equation (2021)    Anion gap 10 5 - 15    Comment: Performed at [facility], [address] [place], [place] [address]  CBC with Differential     Status: None   Collection Time: 04/08/24 10:13 AM  Result Value Ref Range   WBC 8.9 4.0 - 10.5 K/uL   RBC 4.18 3.87 - 5.11 MIL/uL   Hemoglobin 12.8 12.0 - 15.0 g/dL   HCT 61.6 63.9 - 53.9 %   MCV 91.6 80.0 - 100.0 fL   MCH 30.6 26.0 - 34.0 pg   MCHC 33.4 30.0 - 36.0 g/dL   RDW 86.7 88.4 - 84.4 %   Platelets 341 150 - 400 K/uL   nRBC 0.0 0.0 - 0.2 %   Neutrophils Relative % 73 %   Neutro Abs 6.4 1.7 - 7.7 K/uL   Lymphocytes Relative 16 %   Lymphs Abs 1.4 0.7 - 4.0 K/uL   Monocytes Relative 10 %   Monocytes Absolute 0.9 0.1 - 1.0 K/uL   Eosinophils Relative 1 %   Eosinophils Absolute 0.1 0.0 - 0.5 K/uL   Basophils Relative 0 %   Basophils Absolute 0.0 0.0 - 0.1 K/uL   Immature Granulocytes 0 %   Abs Immature Granulocytes 0.02 0.00 - 0.07 K/uL    Comment: Performed at [facility], [address] [place], [place] [address]  Pro Brain natriuretic peptide     Status: None   Collection Time: 04/08/24 10:13 AM  Result Value Ref Range   Pro Brain Natriuretic Peptide 102.0 <300.0 pg/mL    Comment: (NOTE) Age Group        Cut-Points    Interpretation  < 50 years     450 pg/mL       NT-proBNP > 450 pg/mL indicates                                ADHF is likely              50 to 75 years  900 pg/mL      NT-proBNP > 900 pg/mL indicates          ADHF is likely  > 75 years      1800 pg/mL     NT-proBNP > 1800 pg/mL indicates          ADHF is likely                           All ages    Results between       Indeterminate. Further clinical             300 and the cut-   information is needed to determine            point for age group  if ADHF is present.                                                             Elecsys proBNP II/ Elecsys proBNP II STAT           Cut-Point                        Interpretation  300 pg/mL                    NT-proBNP <300pg/mL indicates                             ADHF is not likely  Performed at [facility], [address] [place], [place] [address]   Resp panel by RT-PCR (RSV, Flu A&B, Covid) Anterior Nasal Swab     Status: None   Collection Time: 04/08/24 10:24 AM   Specimen: Anterior Nasal Swab  Result Value Ref Range   SARS Coronavirus 2 by RT PCR NEGATIVE NEGATIVE    Comment: (NOTE) SARS-CoV-2 target nucleic acids are NOT DETECTED.  The SARS-CoV-2 RNA is generally detectable in upper respiratory specimens during the acute phase of infection. The lowest concentration of SARS-CoV-2 viral copies this assay can detect is 138 copies/mL. A negative result does not preclude SARS-Cov-2 infection and should not be used as the sole basis for treatment or other patient management decisions. A negative result may occur with  improper specimen collection/handling, submission of specimen other than nasopharyngeal swab, presence of viral mutation(s) within the areas targeted by this assay, and inadequate number of viral copies(<138 copies/mL). A negative result must be combined with clinical observations, patient history, and epidemiological information. The expected result is Negative.  Fact Sheet for Patients:  bloggercourse.com  Fact Sheet for Healthcare Providers:  seriousbroker.it  This test is no t yet approved or cleared by the [place]  FDA and  has been authorized for detection and/or diagnosis of SARS-CoV-2 by FDA under an Emergency Use Authorization (EUA). This EUA will remain  in effect (meaning this test can be used) for the duration of the COVID-19 declaration under Section 564(b)(1) of the Act, 21 U.S.C.section 360bbb-3(b)(1), unless the authorization is terminated  or revoked sooner.       Influenza A by PCR NEGATIVE NEGATIVE   Influenza B by PCR  NEGATIVE NEGATIVE    Comment: (NOTE) The Xpert Xpress SARS-CoV-2/FLU/RSV plus assay is intended as an aid in the diagnosis of influenza from Nasopharyngeal swab specimens and should not be used as a sole basis for treatment. Nasal washings and aspirates are unacceptable for Xpert Xpress SARS-CoV-2/FLU/RSV testing.  Fact Sheet for Patients: bloggercourse.com  Fact Sheet for Healthcare Providers: seriousbroker.it  This test is not yet approved or cleared by the [place]  FDA and has been authorized for detection and/or diagnosis of SARS-CoV-2 by FDA under an Emergency Use Authorization (EUA). This EUA will remain in effect (meaning this test can be used) for the duration of the COVID-19 declaration under Section 564(b)(1) of the Act, 21 U.S.C. section 360bbb-3(b)(1), unless the authorization is terminated or revoked.     Resp Syncytial Virus by PCR NEGATIVE NEGATIVE    Comment: (NOTE) Fact Sheet for Patients:  bloggercourse.com  Fact Sheet for Healthcare Providers: seriousbroker.it  This test is not yet approved or cleared by the [place]  FDA and has been authorized for detection and/or diagnosis of SARS-CoV-2 by FDA under an Emergency Use Authorization (EUA). This EUA will remain in effect (meaning this test can be used) for the duration of the COVID-19 declaration under Section 564(b)(1) of the Act, 21 U.S.C. section 360bbb-3(b)(1), unless the authorization is terminated or revoked.  Performed at [facility], [address] [place], [place] [address]   Urinalysis, w/ Reflex to Culture (Infection Suspected) -Urine, Clean Catch     Status: Abnormal   Collection Time: 04/08/24 11:52 AM  Result Value Ref Range   Specimen Source URINE, CLEAN CATCH    Color, Urine STRAW (A) YELLOW   APPearance CLEAR (A) CLEAR   Specific Gravity, Urine 1.015 1.005 - 1.030    pH 5.0 5.0 - 8.0   Glucose, UA >=500 (A) NEGATIVE mg/dL   Hgb urine dipstick MODERATE (A) NEGATIVE   Bilirubin Urine NEGATIVE NEGATIVE   Ketones, ur NEGATIVE NEGATIVE mg/dL   Protein, ur NEGATIVE NEGATIVE mg/dL   Nitrite NEGATIVE NEGATIVE   Leukocytes,Ua NEGATIVE NEGATIVE   RBC / HPF 0-5 0 - 5 RBC/hpf   WBC, UA 0-5 0 - 5 WBC/hpf    Comment:        Reflex urine culture not performed if WBC <=10, OR if Squamous epithelial cells >5. If Squamous epithelial cells >5 suggest recollection.    Bacteria, UA NONE SEEN NONE SEEN   Squamous Epithelial / HPF 0-5 0 - 5 /HPF    Comment: Performed at [facility], [address] [place], [place] [address]      Assessment & Plan:  Levofloxacin  Cough medicine Mucinex  Drink plenty of water Mounjaro  15 mg weekly  Problem List Items Addressed This Visit       Cardiovascular and Mediastinum   Benign essential hypertension     Respiratory   Chronic respiratory failure with hypoxia (HCC)   COPD on long-term inhaled steroid therapy (HCC)   Acute bronchitis - Primary     Endocrine   Uncontrolled type 2 diabetes mellitus with hyperglycemia, without long-term current use of insulin  (HCC)   Relevant Medications   tirzepatide  (MOUNJARO ) 15 MG/0.5ML Pen     Other   Class 3 severe obesity due to excess calories without serious comorbidity with body mass index (BMI) of 50.0 to 59.9 in adult [facility])   Relevant Medications   tirzepatide  (MOUNJARO ) 15 MG/0.5ML Pen   Weight loss counseling, encounter for    Return if symptoms worsen or fail to improve, for as scheduled.   Total time spent: 25 minutes. This time includes review of previous notes and results and patient face to face interaction during today's visit.    Jeoffrey Pollen, NP  04/10/2024   This document may have been prepared by [facility] Voice Recognition software and as such may include unintentional dictation errors.     [1]  Allergies Allergen Reactions   Celebrex  [Celecoxib] Swelling   Dilaudid  [Hydromorphone ] Itching   Fentanyl  Other (See Comments)    Hallucinations.   [2]  Outpatient Medications Prior to Visit  Medication Sig   ACCU-CHEK GUIDE TEST test strip CHECK SUGAR DAILY IN AM FASTING DX CODE E11.65   Accu-Chek Softclix Lancets lancets Use as instructed   albuterol  (VENTOLIN  HFA) 108 (90 Base) MCG/ACT inhaler Inhale 2 puffs into the lungs every 6 (six) hours as needed for wheezing.  benzonatate  (TESSALON ) 100 MG capsule Take 2 capsules (200 mg total) by mouth 3 (three) times daily as needed.   cetirizine  (ZYRTEC ) 10 MG tablet TAKE 1 TABLET BY MOUTH EVERY DAY   chlorpheniramine-HYDROcodone  (TUSSIONEX) 10-8 MG/5ML Take 5 mLs by mouth 2 (two) times daily.   Cholecalciferol  (VITAMIN D ) 2000 units CAPS Take 2,000 Units by mouth daily.   docusate sodium  (COLACE) 100 MG capsule TAKE 1 CAPSULE BY MOUTH EVERY DAY   fluticasone  (FLONASE ) 50 MCG/ACT nasal spray USE AS DIRECTED 1 SPRAY PER NARES DAILY   folic acid (FOLVITE) 1 MG tablet Take 1 mg by mouth daily.   furosemide  (LASIX ) 20 MG tablet Take 1 tablet (20 mg total) by mouth as needed.   hydrochlorothiazide  (HYDRODIURIL ) 25 MG tablet Take 25 mg by mouth daily.   hydroxychloroquine (PLAQUENIL) 200 MG tablet Take 200 mg by mouth 2 (two) times daily.   KLOR-CON  M10 10 MEQ tablet Take 10 mEq by mouth daily.   losartan  (COZAAR ) 25 MG tablet TAKE 1 TABLET (25 MG TOTAL) BY MOUTH DAILY.   metFORMIN  (GLUCOPHAGE ) 500 MG tablet Take 1 tablet (500 mg total) by mouth 2 (two) times daily.   methotrexate (RHEUMATREX) 2.5 MG tablet Take 12.5 mg by mouth once a week.   metoprolol  (LOPRESSOR ) 50 MG tablet Take 50 mg by mouth every morning.    omeprazole  (PRILOSEC) 40 MG capsule Take 1 capsule (40 mg total) by mouth daily.   ORENCIA 250 MG injection Inject 1 mL into the vein as directed.   oxyCODONE -acetaminophen  (PERCOCET) 7.5-325 MG tablet Take 1 tablet by mouth every 6 (six) hours as needed.   OXYGEN Inhale  into the lungs.   roflumilast  (DALIRESP ) 500 MCG TABS tablet Take 500 mcg by mouth every morning.    rosuvastatin  (CRESTOR ) 40 MG tablet Take 1 tablet (40 mg total) by mouth daily.   TRELEGY ELLIPTA 100-62.5-25 MCG/INH AEPB Inhale into the lungs daily.   XARELTO  20 MG TABS tablet TAKE 1 TABLET BY MOUTH EVERY DAY   [DISCONTINUED] tirzepatide  (MOUNJARO ) 12.5 MG/0.5ML Pen Inject 12.5 mg into the skin once a week.   empagliflozin  (JARDIANCE ) 25 MG TABS tablet Take 1 tablet (25 mg total) by mouth daily.   LINZESS  290 MCG CAPS capsule TAKE 1 CAPSULE BY MOUTH DAILY BEFORE BREAKFAST. (Patient taking differently: Take 290 mcg by mouth as needed.)   predniSONE  (DELTASONE ) 20 MG tablet Take 2 tablets (40 mg total) by mouth daily with breakfast for 5 days.   tiZANidine (ZANAFLEX) 4 MG tablet Take 4 mg by mouth 2 (two) times daily. (Patient taking differently: Take 4 mg by mouth as needed.)   No facility-administered medications prior to visit.   "

## 2024-05-09 ENCOUNTER — Ambulatory Visit: Admitting: Family

## 2024-05-12 ENCOUNTER — Ambulatory Visit: Admitting: Family
